# Patient Record
Sex: Female | Born: 1990 | Race: Black or African American | Hispanic: No | Marital: Single | Smoking: Former smoker
Health system: Southern US, Community
[De-identification: ages and names within clinical notes are randomized; demographics above are authoritative.]

## PROBLEM LIST (undated history)

## (undated) ENCOUNTER — Inpatient Hospital Stay (HOSPITAL_COMMUNITY): Payer: Self-pay

## (undated) DIAGNOSIS — N83209 Unspecified ovarian cyst, unspecified side: Secondary | ICD-10-CM

## (undated) DIAGNOSIS — D649 Anemia, unspecified: Secondary | ICD-10-CM

## (undated) DIAGNOSIS — G43909 Migraine, unspecified, not intractable, without status migrainosus: Secondary | ICD-10-CM

## (undated) HISTORY — PX: OOPHORECTOMY: SHX86

---
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
## 2012-10-02 ENCOUNTER — Encounter (HOSPITAL_COMMUNITY): Payer: Self-pay

## 2012-10-02 ENCOUNTER — Emergency Department (HOSPITAL_COMMUNITY)
Admission: EM | Admit: 2012-10-02 | Discharge: 2012-10-03 | Disposition: A | Discharge status: Home / Self Care | Payer: Self-pay | Attending: Emergency Medicine | Admitting: Emergency Medicine

## 2012-10-02 DIAGNOSIS — Z79899 Other long term (current) drug therapy: Secondary | ICD-10-CM | POA: Insufficient documentation

## 2012-10-02 DIAGNOSIS — Z8679 Personal history of other diseases of the circulatory system: Secondary | ICD-10-CM | POA: Insufficient documentation

## 2012-10-02 DIAGNOSIS — R110 Nausea: Secondary | ICD-10-CM | POA: Insufficient documentation

## 2012-10-02 DIAGNOSIS — R51 Headache: Secondary | ICD-10-CM | POA: Insufficient documentation

## 2012-10-02 HISTORY — DX: Migraine, unspecified, not intractable, without status migrainosus: G43.909

## 2012-10-02 MED ORDER — DEXAMETHASONE SODIUM PHOSPHATE 10 MG/ML IJ SOLN
10.0000 mg | Freq: Once | INTRAMUSCULAR | Status: AC
  Administered 2012-10-02: 10 mg via INTRAVENOUS
  Filled 2012-10-02: qty 1

## 2012-10-02 MED ORDER — METOCLOPRAMIDE HCL 5 MG/ML IJ SOLN
10.0000 mg | Freq: Once | INTRAMUSCULAR | Status: AC
  Administered 2012-10-02: 10 mg via INTRAVENOUS
  Filled 2012-10-02: qty 2

## 2012-10-02 MED ORDER — DIPHENHYDRAMINE HCL 50 MG/ML IJ SOLN
25.0000 mg | Freq: Once | INTRAMUSCULAR | Status: AC
  Administered 2012-10-02: 25 mg via INTRAVENOUS
  Filled 2012-10-02: qty 1

## 2012-10-02 MED ORDER — SODIUM CHLORIDE 0.9 % IV BOLUS (SEPSIS)
1000.0000 mL | Freq: Once | INTRAVENOUS | Status: AC
  Administered 2012-10-02: 1000 mL via INTRAVENOUS

## 2012-10-02 MED ORDER — KETOROLAC TROMETHAMINE 30 MG/ML IJ SOLN
30.0000 mg | Freq: Once | INTRAMUSCULAR | Status: AC
  Administered 2012-10-02: 30 mg via INTRAVENOUS
  Filled 2012-10-02: qty 1

## 2012-10-02 NOTE — ED Provider Notes (Signed)
History     CSN: [id number]  Arrival date & time 10/02/12  2147   First MD Initiated Contact with Patient 10/02/12 2246      Chief Complaint  Patient presents with  . Headache   HPI   History provided by the patient. The patient is a 22 year old female who has been diagnosed with migraine headaches and presents with complaints of similar migraine headache symptoms. She states her headache first began yesterday morning and has a mild "normal headache". It was affecting her bilateral forehead. She took an Excedrin in the afternoon and laid down for a 2 hour nap and seemed to feel better. In the evening her symptoms returned and she did take one of her Imitrex. After sleeping through the night and waking this morning she was feeling well he in her headache symptoms started to return gradually through the day. She also began to have some nausea which she took some Zofran 4 with mild improvement. As her headache worsened into her migraine pain she began having pains through her back muscles. Pains are worse with movements and occurred during one of her most recent severe migraines. She has not used any other medications for symptoms. Denies any other aggravating or alleviating factors. No episodes of vomiting. Denies fever, chills or sweats.     Past Medical History  Diagnosis Date  . Migraine     Past Surgical History  Procedure Laterality Date  . Oophorectomy Right     No family history on file.  History  Substance Use Topics  . Smoking status: Not on file  . Smokeless tobacco: Never Used  . Alcohol Use: No    OB History   Grav Para Term Preterm Abortions TAB SAB Ect Mult Living                  Review of Systems  Constitutional: Negative for fever, chills and diaphoresis.  Gastrointestinal: Positive for nausea. Negative for vomiting.  Musculoskeletal: Positive for back pain.  Neurological: Positive for headaches.  All other systems reviewed and are  negative.    Allergies  Review of patient's allergies indicates no known allergies.  Home Medications   Current Outpatient Rx  Name  Route  Sig  Dispense  Refill  . aspirin-acetaminophen-caffeine (EXCEDRIN MIGRAINE) 250-250-65 MG per tablet   Oral   Take 2 tablets by mouth every 6 (six) hours as needed for pain (migraine).         . cetirizine (ZYRTEC) 10 MG tablet   Oral   Take 10 mg by mouth daily as needed.           . ondansetron (ZOFRAN-ODT) 8 MG disintegrating tablet   Oral   Take 8 mg by mouth every 8 (eight) hours as needed for nausea (nausea).         . SUMAtriptan (IMITREX) 50 MG tablet   Oral   Take 50 mg by mouth every 2 (two) hours as needed for migraine.           BP 113/64  Pulse 114  Temp(Src) 100.4 F (38 C) (Oral)  Resp 18  Ht 5\' 4"  (1.626 m)  Wt 276 lb 2 oz (125.249 kg)  BMI 47.37 kg/m2  SpO2 100%  LMP 09/19/2012  Physical Exam  Nursing note and vitals reviewed. Constitutional: She is oriented to person, place, and time. She appears well-developed and well-nourished. No distress.  HENT:  Head: Normocephalic and atraumatic.  Eyes: Conjunctivae and EOM are normal. Pupils are  equal, round, and reactive to light.  Neck: Normal range of motion. Neck supple.  No meningeal signs  Cardiovascular: Normal rate and regular rhythm.   No murmur heard. Pulmonary/Chest: Effort normal and breath sounds normal. No respiratory distress. She has no wheezes. She has no rales.  Abdominal: Soft. There is no tenderness. There is no rigidity, no rebound, no guarding, no CVA tenderness and no tenderness at McBurney's point.  Musculoskeletal: Normal range of motion. She exhibits no edema and no tenderness.  Neurological: She is alert and oriented to person, place, and time. She has normal strength. No cranial nerve deficit or sensory deficit. Gait normal.  Skin: Skin is warm and dry. No rash noted.  Psychiatric: She has a normal mood and affect. Her behavior is  normal.    ED Course  Procedures       1. Headache       MDM  10:50 PM patient seen and evaluated. Patient well-appearing in no acute distress. Normal nonfocal neuro exam. Has reported history of migraine headaches being treated by Imitrex and Excedrin by PCP. I personally rechecked her temperature which was 98.3. No complaints of fever.  Patient feeling much better after medications. Headache in her back pains have soft. She has no focal neural deficits at this time is ready to be discharged home.      Angus Seller, PA-C 10/03/12 [phone]

## 2012-10-02 NOTE — ED Notes (Signed)
Pt states she has a hx of migraine headaches-developed generalized headache yesterday AM with pain and pressure radiating down neck to shoulders and arms and states progresses to lower back-states has not had a migraine like this in 1 year-is not on any preventative migraine meds-used sumatriptan at noon and 1500-states "it made me sleep"-no nausea today-took zofran last night for nausea.

## 2012-10-04 NOTE — ED Provider Notes (Signed)
Medical screening examination/treatment/procedure(s) were performed by non-physician practitioner and as supervising physician I was immediately available for consultation/collaboration.  Brennen Gardiner, MD 10/04/12 1618 

## 2013-04-23 ENCOUNTER — Emergency Department (HOSPITAL_COMMUNITY)
Admission: EM | Admit: 2013-04-23 | Discharge: 2013-04-23 | Disposition: A | Discharge status: Home / Self Care | Payer: Medicaid Other | Attending: Emergency Medicine | Admitting: Emergency Medicine

## 2013-04-23 ENCOUNTER — Encounter (HOSPITAL_COMMUNITY): Payer: Self-pay | Admitting: Emergency Medicine

## 2013-04-23 DIAGNOSIS — Z8679 Personal history of other diseases of the circulatory system: Secondary | ICD-10-CM | POA: Insufficient documentation

## 2013-04-23 DIAGNOSIS — J039 Acute tonsillitis, unspecified: Secondary | ICD-10-CM | POA: Insufficient documentation

## 2013-04-23 DIAGNOSIS — F17200 Nicotine dependence, unspecified, uncomplicated: Secondary | ICD-10-CM | POA: Insufficient documentation

## 2013-04-23 DIAGNOSIS — R000 Tachycardia, unspecified: Secondary | ICD-10-CM | POA: Insufficient documentation

## 2013-04-23 LAB — RAPID STREP SCREEN (MED CTR MEBANE ONLY): Streptococcus, Group A Screen (Direct): NEGATIVE

## 2013-04-23 MED ORDER — AMOXICILLIN 500 MG PO CAPS: 500.0000 mg | ORAL_CAPSULE | Freq: Two times a day (BID) | ORAL | Status: DC

## 2013-04-23 MED ORDER — IBUPROFEN 800 MG PO TABS
800.0000 mg | ORAL_TABLET | Freq: Once | ORAL | Status: AC
  Administered 2013-04-23: 800 mg via ORAL
  Filled 2013-04-23: qty 1

## 2013-04-23 MED ORDER — HYDROCODONE-ACETAMINOPHEN 7.5-325 MG/15ML PO SOLN: 15.0000 mL | Freq: Four times a day (QID) | ORAL | Status: DC | PRN

## 2013-04-23 NOTE — ED Notes (Signed)
Sore throat / headache x 2 days

## 2013-04-23 NOTE — ED Notes (Signed)
Pt presents with c/o sore throat, weakness, and a bad headache.

## 2013-04-23 NOTE — ED Provider Notes (Signed)
CSN: [id number]     Arrival date & time 04/23/13  1854 History  This chart was scribed for non-physician practitioner, Antony Madura, PA-C working with Nelia Shi, MD by Greggory Stallion, ED scribe. This patient was seen in room WTR7/WTR7 and the patient's care was started at 9:49 PM.    Chief Complaint  Patient presents with  . Sore Throat   The history is provided by the patient. No language interpreter was used.   HPI Comments: Cynthia Horne is a 22 y.o. female who presents to the Emergency Department complaining of worsening sore throat that started 2 days ago. Swallowing worsens the pain. She has also had fever and headache. Pt has not done anything for her symptoms. Denies inability to swallowing, drooling, nasal congestion, rhinorrhea, ear drainage, neck pain or stiffness, and shortness of breath. Denies history of mono.   Past Medical History  Diagnosis Date  . Migraine    Past Surgical History  Procedure Laterality Date  . Oophorectomy Right    No family history on file. History  Substance Use Topics  . Smoking status: Current Every Day Smoker -- 0.50 packs/day    Types: Cigarettes, Cigars  . Smokeless tobacco: Never Used  . Alcohol Use: No   OB History   Grav Para Term Preterm Abortions TAB SAB Ect Mult Living                 Review of Systems  Constitutional: Positive for fever.  HENT: Positive for sore throat. Negative for congestion, drooling, ear discharge, rhinorrhea and trouble swallowing.   Neurological: Positive for headaches.  All other systems reviewed and are negative.   Allergies  Review of patient's allergies indicates no known allergies.  Home Medications   Current Outpatient Rx  Name  Route  Sig  Dispense  Refill  . amoxicillin (AMOXIL) 500 MG capsule   Oral   Take 1 capsule (500 mg total) by mouth 2 (two) times daily.   20 capsule   0   . HYDROcodone-acetaminophen (HYCET) 7.5-325 mg/15 ml solution   Oral   Take 15 mLs by mouth 4 (four)  times daily as needed for moderate pain.   120 mL   0    BP 110/76  Pulse 131  Temp(Src) 101.6 F (38.7 C) (Oral)  Resp 24  SpO2 100%  LMP 03/26/2013  Physical Exam  Nursing note and vitals reviewed. Constitutional: She is oriented to person, place, and time. She appears well-developed and well-nourished. No distress.  HENT:  Head: Normocephalic and atraumatic.  Right Ear: Tympanic membrane, external ear and ear canal normal.  Left Ear: Tympanic membrane, external ear and ear canal normal.  Nose: Nose normal.  Mouth/Throat: Uvula is midline and mucous membranes are normal. No oral lesions. No trismus in the jaw. Oropharyngeal exudate and posterior oropharyngeal erythema present. No posterior oropharyngeal edema or tonsillar abscesses.  Bilateral tonsillar enlargement and erythema, as well as exudates. Airway patent. Patient tolerating secretions without difficulty.  Eyes: Conjunctivae and EOM are normal. Pupils are equal, round, and reactive to light. No scleral icterus.  Neck: Normal range of motion. Neck supple.  Cardiovascular: Regular rhythm and normal heart sounds.  Tachycardia present.   Pulmonary/Chest: Effort normal and breath sounds normal. No stridor. No respiratory distress. She has no wheezes. She has no rales.  Abdominal: Soft. She exhibits no distension. There is no tenderness.  Musculoskeletal: Normal range of motion.  Lymphadenopathy:    She has cervical adenopathy.  Neurological: She  is alert and oriented to person, place, and time.  Skin: Skin is warm and dry. No rash noted. She is not diaphoretic. No erythema. No pallor.  Psychiatric: She has a normal mood and affect. Her behavior is normal.    ED Course  Procedures (including critical care time)  DIAGNOSTIC STUDIES: Oxygen Saturation is 100% on RA, normal by my interpretation.    COORDINATION OF CARE: 9:55 PM-Discussed treatment plan which includes strep test and tylenol with pt at bedside and pt agreed  to plan.   Labs Review Labs Reviewed  RAPID STREP SCREEN  CULTURE, GROUP A STREP   Imaging Review No results found.  EKG Interpretation   None       MDM   1. Tonsillitis    Uncomplicated tonsillitis. Patient well and nontoxic appearing and hemodynamically stable. Uvula midline without evidence of peritonsillar abscess. No nuchal rigidity or meningeal signs. Airway patent patient tolerating secretions without difficulty. Rapid strep screen negative today. Patient stable for discharge with prescription for Hycet. Given physical exam findings, will also prescribe amoxicillin to begin on 04/26/2013 should symptoms not improve with supportive treatment. Saltwater gargles and ibuprofen also advised. Return precautions provided and patient agreeable to plan with no unaddressed concerns.  I personally performed the services described in this documentation, which was scribed in my presence. The recorded information has been reviewed and is accurate.   Filed Vitals:   04/23/13 2007 04/23/13 2222  BP: 110/76   Pulse: 131   Temp: 101.6 F (38.7 C) 98.9 F (37.2 C)  TempSrc: Oral Oral  Resp: 24   SpO2: 100%      Antony Madura, PA-C 04/23/13 2237

## 2013-04-25 LAB — CULTURE, GROUP A STREP

## 2013-04-26 ENCOUNTER — Telehealth (HOSPITAL_COMMUNITY): Payer: Self-pay | Admitting: Emergency Medicine

## 2013-04-26 NOTE — ED Provider Notes (Signed)
Medical screening examination/treatment/procedure(s) were performed by non-physician practitioner and as supervising physician I was immediately available for consultation/collaboration.   Ivonne Freeburg L Nicolis Boody, MD 04/26/13 1048 

## 2013-04-26 NOTE — ED Notes (Signed)
Post ED Visit - Positive Culture Follow-up  Culture report reviewed by antimicrobial stewardship pharmacist: []  Wes Dulaney, Pharm.D., BCPS []  Celedonio MiyamotoJeremy Frens, Pharm.D., BCPS []  Georgina PillionElizabeth Martin, [address]Ph[address] BCPS []  FredoniaMinh Pham, [address]Ph[address]D., BCPS, AAHIVP []  Estella HuskMichelle Turner, Pharm.D., BCPS, AAHIVP [x]  Lysle Pearlachel Rumbarger, Pharm.D., BCPS  Positive strep culture Treated with Amoxicillin, organism sensitive to the same and no further patient follow-up is required at this time.  Fancy Farm[place]land, Jenel LucksKylie 04/26/2013, 1:29 PM

## 2013-08-30 ENCOUNTER — Encounter (HOSPITAL_COMMUNITY): Payer: Self-pay | Admitting: Emergency Medicine

## 2013-08-30 ENCOUNTER — Emergency Department (HOSPITAL_COMMUNITY)
Admission: EM | Admit: 2013-08-30 | Discharge: 2013-08-30 | Disposition: A | Discharge status: Home / Self Care | Payer: No Typology Code available for payment source | Attending: Emergency Medicine | Admitting: Emergency Medicine

## 2013-08-30 ENCOUNTER — Emergency Department (HOSPITAL_COMMUNITY): Payer: No Typology Code available for payment source

## 2013-08-30 DIAGNOSIS — S161XXA Strain of muscle, fascia and tendon at neck level, initial encounter: Secondary | ICD-10-CM

## 2013-08-30 DIAGNOSIS — Z791 Long term (current) use of non-steroidal anti-inflammatories (NSAID): Secondary | ICD-10-CM | POA: Insufficient documentation

## 2013-08-30 DIAGNOSIS — S29012A Strain of muscle and tendon of back wall of thorax, initial encounter: Secondary | ICD-10-CM

## 2013-08-30 DIAGNOSIS — Y9389 Activity, other specified: Secondary | ICD-10-CM | POA: Insufficient documentation

## 2013-08-30 DIAGNOSIS — F17200 Nicotine dependence, unspecified, uncomplicated: Secondary | ICD-10-CM | POA: Insufficient documentation

## 2013-08-30 DIAGNOSIS — S139XXA Sprain of joints and ligaments of unspecified parts of neck, initial encounter: Secondary | ICD-10-CM | POA: Insufficient documentation

## 2013-08-30 DIAGNOSIS — Z8679 Personal history of other diseases of the circulatory system: Secondary | ICD-10-CM | POA: Insufficient documentation

## 2013-08-30 DIAGNOSIS — S239XXA Sprain of unspecified parts of thorax, initial encounter: Secondary | ICD-10-CM | POA: Insufficient documentation

## 2013-08-30 DIAGNOSIS — Z79899 Other long term (current) drug therapy: Secondary | ICD-10-CM | POA: Insufficient documentation

## 2013-08-30 DIAGNOSIS — Y92410 Unspecified street and highway as the place of occurrence of the external cause: Secondary | ICD-10-CM | POA: Insufficient documentation

## 2013-08-30 MED ORDER — OXYCODONE-ACETAMINOPHEN 5-325 MG PO TABS
2.0000 | ORAL_TABLET | Freq: Once | ORAL | Status: AC
  Administered 2013-08-30: 1 via ORAL
  Filled 2013-08-30: qty 2

## 2013-08-30 MED ORDER — ONDANSETRON 8 MG PO TBDP
8.0000 mg | ORAL_TABLET | Freq: Once | ORAL | Status: AC
  Administered 2013-08-30: 8 mg via ORAL
  Filled 2013-08-30: qty 1

## 2013-08-30 MED ORDER — MELOXICAM 7.5 MG PO TABS: 15.0000 mg | ORAL_TABLET | Freq: Every day | ORAL | Status: DC

## 2013-08-30 MED ORDER — METHOCARBAMOL 500 MG PO TABS
500.0000 mg | ORAL_TABLET | Freq: Once | ORAL | Status: AC
  Administered 2013-08-30: 500 mg via ORAL
  Filled 2013-08-30: qty 1

## 2013-08-30 MED ORDER — METHOCARBAMOL 500 MG PO TABS: 500.0000 mg | ORAL_TABLET | Freq: Two times a day (BID) | ORAL | Status: DC

## 2013-08-30 MED ORDER — HYDROCODONE-ACETAMINOPHEN 5-325 MG PO TABS: 1.0000 | ORAL_TABLET | Freq: Four times a day (QID) | ORAL | Status: DC | PRN
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
## 2013-08-30 NOTE — ED Notes (Signed)
Patient transported to X-ray 

## 2013-08-30 NOTE — Discharge Instructions (Signed)
Recommend you take Mobic and Robaxin as prescribed and apply ice to the areas of pain 3-4 times per day for at least 30 minutes each time. Do not be surprised if you wake up tomorrow and notice a different area hurting you. The treatment for this pain will be the same as above. You may take Norco as needed for severe pain. Followup with your primary care doctor.  Cervical Sprain A cervical sprain is an injury in the neck in which the strong, fibrous tissues (ligaments) that connect your neck bones stretch or tear. Cervical sprains can range from mild to severe. Severe cervical sprains can cause the neck vertebrae to be unstable. This can lead to damage of the spinal cord and can result in serious nervous system problems. The amount of time it takes for a cervical sprain to get better depends on the cause and extent of the injury. Most cervical sprains heal in 1 to 3 weeks. CAUSES  Severe cervical sprains may be caused by:   Contact sport injuries (such as from football, rugby, wrestling, hockey, auto racing, gymnastics, diving, martial arts, or boxing).   Motor vehicle collisions.   Whiplash injuries. This is an injury from a sudden forward-and backward whipping movement of the head and neck.  Falls.  Mild cervical sprains may be caused by:   Being in an awkward position, such as while cradling a telephone between your ear and shoulder.   Sitting in a chair that does not offer proper support.   Working at a poorly Marketing executivedesigned computer station.   Looking up or down for long periods of time.  SYMPTOMS   Pain, soreness, stiffness, or a burning sensation in the front, back, or sides of the neck. This discomfort may develop immediately after the injury or slowly, 24 hours or more after the injury.   Pain or tenderness directly in the middle of the back of the neck.   Shoulder or upper back pain.   Limited ability to move the neck.   Headache.   Dizziness.   Weakness,  numbness, or tingling in the hands or arms.   Muscle spasms.   Difficulty swallowing or chewing.   Tenderness and swelling of the neck.  DIAGNOSIS  Most of the time your health care provider can diagnose a cervical sprain by taking your history and doing a physical exam. Your health care provider will ask about previous neck injuries and any known neck problems, such as arthritis in the neck. X-rays may be taken to find out if there are any other problems, such as with the bones of the neck. Other tests, such as a CT scan or MRI, may also be needed.  TREATMENT  Treatment depends on the severity of the cervical sprain. Mild sprains can be treated with rest, keeping the neck in place (immobilization), and pain medicines. Severe cervical sprains are immediately immobilized. Further treatment is done to help with pain, muscle spasms, and other symptoms and may include:  Medicines, such as pain relievers, numbing medicines, or muscle relaxants.   Physical therapy. This may involve stretching exercises, strengthening exercises, and posture training. Exercises and improved posture can help stabilize the neck, strengthen muscles, and help stop symptoms from returning.  HOME CARE INSTRUCTIONS   Put ice on the injured area.   Put ice in a plastic bag.   Place a towel between your skin and the bag.   Leave the ice on for 15 20 minutes, 3 4 times a day.  If your injury was severe, you may have been given a cervical collar to wear. A cervical collar is a two-piece collar designed to keep your neck from moving while it heals.  Do not remove the collar unless instructed by your health care provider.  If you have long hair, keep it outside of the collar.  Ask your health care provider before making any adjustments to your collar. Minor adjustments may be required over time to improve comfort and reduce pressure on your chin or on the back of your head.  Ifyou are allowed to remove the  collar for cleaning or bathing, follow your health care provider's instructions on how to do so safely.  Keep your collar clean by wiping it with mild soap and water and drying it completely. If the collar you have been given includes removable pads, remove them every 1 2 days and hand wash them with soap and water. Allow them to air dry. They should be completely dry before you wear them in the collar.  If you are allowed to remove the collar for cleaning and bathing, wash and dry the skin of your neck. Check your skin for irritation or sores. If you see any, tell your health care provider.  Do not drive while wearing the collar.   Only take over-the-counter or prescription medicines for pain, discomfort, or fever as directed by your health care provider.   Keep all follow-up appointments as directed by your health care provider.   Keep all physical therapy appointments as directed by your health care provider.   Make any needed adjustments to your workstation to promote good posture.   Avoid positions and activities that make your symptoms worse.   Warm up and stretch before being active to help prevent problems.  SEEK MEDICAL CARE IF:   Your pain is not controlled with medicine.   You are unable to decrease your pain medicine over time as planned.   Your activity level is not improving as expected.  SEEK IMMEDIATE MEDICAL CARE IF:   You develop any bleeding.  You develop stomach upset.  You have signs of an allergic reaction to your medicine.   Your symptoms get worse.   You develop new, unexplained symptoms.   You have numbness, tingling, weakness, or paralysis in any part of your body.  MAKE SURE YOU:   Understand these instructions.  Will watch your condition.  Will get help right away if you are not doing well or get worse. Document Released: 02/07/2007 Document Revised: 01/31/2013 Document Reviewed: 10/18/2012 [facility] Patient Information 2014  [place], [place].  Back Pain, Adult Low back pain is very common. About 1 in 5 people have back pain.The cause of low back pain is rarely dangerous. The pain often gets better over time.About half of people with a sudden onset of back pain feel better in just 2 weeks. About 8 in 10 people feel better by 6 weeks.  CAUSES Some common causes of back pain include:  Strain of the muscles or ligaments supporting the spine.  Wear and tear (degeneration) of the spinal discs.  Arthritis.  Direct injury to the back. DIAGNOSIS Most of the time, the direct cause of low back pain is not known.However, back pain can be treated effectively even when the exact cause of the pain is unknown.Answering your caregiver's questions about your overall health and symptoms is one of the most accurate ways to make sure the cause of your pain is not dangerous. If your caregiver  needs more information, he or she may order lab work or imaging tests (X-rays or MRIs).However, even if imaging tests show changes in your back, this usually does not require surgery. HOME CARE INSTRUCTIONS For many people, back pain returns.Since low back pain is rarely dangerous, it is often a condition that people can learn to [facility] their own.   Remain active. It is stressful on the back to sit or stand in one place. Do not sit, drive, or stand in one place for more than 30 minutes at a time. Take short walks on level surfaces as soon as pain allows.Try to increase the length of time you walk each day.  Do not stay in bed.Resting more than 1 or 2 days can delay your recovery.  Do not avoid exercise or work.Your body is made to move.It is not dangerous to be active, even though your back may hurt.Your back will likely heal faster if you return to being active before your pain is gone.  Pay attention to your body when you bend and lift. Many people have less discomfortwhen lifting if they bend their knees, keep the load close to  their bodies,and avoid twisting. Often, the most comfortable positions are those that put less stress on your recovering back.  Find a comfortable position to sleep. Use a firm mattress and lie on your side with your knees slightly bent. If you lie on your back, put a pillow under your knees.  Only take over-the-counter or prescription medicines as directed by your caregiver. Over-the-counter medicines to reduce pain and inflammation are often the most helpful.Your caregiver may prescribe muscle relaxant drugs.These medicines help dull your pain so you can more quickly return to your normal activities and healthy exercise.  Put ice on the injured area.  Put ice in a plastic bag.  Place a towel between your skin and the bag.  Leave the ice on for 15-20 minutes, 03-04 times a day for the first 2 to 3 days. After that, ice and heat may be alternated to reduce pain and spasms.  Ask your caregiver about trying back exercises and gentle massage. This may be of some benefit.  Avoid feeling anxious or stressed.Stress increases muscle tension and can worsen back pain.It is important to recognize when you are anxious or stressed and learn ways to manage it.Exercise is a great option. SEEK MEDICAL CARE IF:  You have pain that is not relieved with rest or medicine.  You have pain that does not improve in 1 week.  You have new symptoms.  You are generally not feeling well. SEEK IMMEDIATE MEDICAL CARE IF:   You have pain that radiates from your back into your legs.  You develop new bowel or bladder control problems.  You have unusual weakness or numbness in your arms or legs.  You develop nausea or vomiting.  You develop abdominal pain.  You feel faint. Document Released: 04/12/2005 Document Revised: 10/12/2011 Document Reviewed: 08/31/2010 [facility] Patient Information 2014 [place], [place].
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
## 2013-09-14 NOTE — ED Provider Notes (Signed)
Medical screening examination/treatment/procedure(s) were performed by non-physician practitioner and as supervising physician I was immediately available for consultation/collaboration.   EKG Interpretation None        Enid Skeens, MD 09/14/13 [phone]

## 2014-02-08 ENCOUNTER — Emergency Department (HOSPITAL_COMMUNITY)
Admission: EM | Admit: 2014-02-08 | Discharge: 2014-02-08 | Disposition: A | Discharge status: Home / Self Care | Payer: No Typology Code available for payment source | Attending: Emergency Medicine | Admitting: Emergency Medicine

## 2014-02-08 ENCOUNTER — Encounter (HOSPITAL_COMMUNITY): Payer: Self-pay | Admitting: Emergency Medicine

## 2014-02-08 DIAGNOSIS — Z720 Tobacco use: Secondary | ICD-10-CM | POA: Insufficient documentation

## 2014-02-08 DIAGNOSIS — Z3202 Encounter for pregnancy test, result negative: Secondary | ICD-10-CM | POA: Insufficient documentation

## 2014-02-08 DIAGNOSIS — Z79899 Other long term (current) drug therapy: Secondary | ICD-10-CM | POA: Insufficient documentation

## 2014-02-08 DIAGNOSIS — J0100 Acute maxillary sinusitis, unspecified: Secondary | ICD-10-CM | POA: Insufficient documentation

## 2014-02-08 DIAGNOSIS — Z8679 Personal history of other diseases of the circulatory system: Secondary | ICD-10-CM | POA: Insufficient documentation

## 2014-02-08 DIAGNOSIS — Z791 Long term (current) use of non-steroidal anti-inflammatories (NSAID): Secondary | ICD-10-CM | POA: Insufficient documentation

## 2014-02-08 LAB — RAPID STREP SCREEN (MED CTR MEBANE ONLY): STREPTOCOCCUS, GROUP A SCREEN (DIRECT): NEGATIVE

## 2014-02-08 LAB — POC URINE PREG, ED: PREG TEST UR: NEGATIVE

## 2014-02-08 MED ORDER — CEPASTAT 14.5 MG MT LOZG: 1.0000 | LOZENGE | OROMUCOSAL | Status: DC | PRN

## 2014-02-08 MED ORDER — AMOXICILLIN 500 MG PO CAPS: 500.0000 mg | ORAL_CAPSULE | Freq: Three times a day (TID) | ORAL | Status: DC

## 2014-02-08 NOTE — ED Notes (Signed)
PA-C at bedside 

## 2014-02-08 NOTE — ED Notes (Signed)
Pt arrived to ED with a complaint of a sore throat.  Pt states she feels her tonsils are swollen .  Pt states condition has been present for a month.  Tonight pt has a cough wwhich has irritated her throat and causes her pain.

## 2014-02-08 NOTE — ED Provider Notes (Signed)
CSN: [id number]     Arrival date & time 02/08/14  0046 History   First MD Initiated Contact with Patient 02/08/14 0132     Chief Complaint  Patient presents with  . Sore Throat  . Cough     (Consider location/radiation/quality/duration/timing/severity/associated sxs/prior Treatment) HPI Comments: Patient is a 23 year old female who presents with a 1 month history of sore throat. Patient reports gradual onset and progressively worsening sharp, severe throat pain. The pain is constant and made worse with swallowing. The pain is localized to the patient's throat and equal on both sides. Nothing alleviates the pain. The patient has not tried anything for symptom relief. Patient reports associated subjective fever, cervical adenopathy, sinus congestion and non productive cough. Patient denies headache, visual changes, difficulty breathing, chest pain, SOB, abdominal pain, NVD.      Past Medical History  Diagnosis Date  . Migraine    Past Surgical History  Procedure Laterality Date  . Oophorectomy Right    History reviewed. No pertinent family history. History  Substance Use Topics  . Smoking status: Current Every Day Smoker -- 0.50 packs/day    Types: Cigarettes, Cigars  . Smokeless tobacco: Never Used  . Alcohol Use: No   OB History   Grav Para Term Preterm Abortions TAB SAB Ect Mult Living                 Review of Systems  Constitutional: Negative for fever, chills and fatigue.  HENT: Positive for congestion and sore throat. Negative for trouble swallowing.   Eyes: Negative for visual disturbance.  Respiratory: Negative for shortness of breath.   Cardiovascular: Negative for chest pain and palpitations.  Gastrointestinal: Negative for nausea, vomiting, abdominal pain and diarrhea.  Genitourinary: Negative for dysuria and difficulty urinating.  Musculoskeletal: Negative for arthralgias and neck pain.  Skin: Negative for color change.  Neurological: Negative for dizziness  and weakness.  Psychiatric/Behavioral: Negative for dysphoric mood.      Allergies  Review of patient's allergies indicates no known allergies.  Home Medications   Prior to Admission medications   Medication Sig Start Date End Date Taking? Authorizing Provider  cetirizine (ZYRTEC) 10 MG tablet Take 10 mg by mouth daily.    Historical Provider, MD  HYDROcodone-acetaminophen (NORCO/VICODIN) 5-325 MG per tablet Take 1-2 tablets by mouth every 6 (six) hours as needed. 08/30/13   Antony MaduraKelly Humes, PA-C  ibuprofen (ADVIL,MOTRIN) 200 MG tablet Take 400 mg by mouth every 6 (six) hours as needed.    Historical Provider, MD  meloxicam (MOBIC) 7.5 MG tablet Take 2 tablets (15 mg total) by mouth daily. 08/30/13   Antony MaduraKelly Humes, PA-C  methocarbamol (ROBAXIN) 500 MG tablet Take 1 tablet (500 mg total) by mouth 2 (two) times daily. 08/30/13   Antony MaduraKelly Humes, PA-C   BP 117/74  Pulse 94  Temp(Src) 98.3 F (36.8 C) (Oral)  Resp 18  Ht 5\' 5"  (1.651 m)  SpO2 96%  LMP 12/25/2013 Physical Exam  Nursing note and vitals reviewed. Constitutional: She is oriented to person, place, and time. She appears well-developed and well-nourished. No distress.  HENT:  Head: Normocephalic and atraumatic.  Mouth/Throat: Oropharynx is clear and moist. No oropharyngeal exudate.  Bilateral tonsillar edema without erythema or exudate. Maxillary sinus tenderness to palpation.   Eyes: Conjunctivae and EOM are normal.  Neck: Normal range of motion.  Cardiovascular: Normal rate and regular rhythm.  Exam reveals no gallop and no friction rub.   No murmur heard. Pulmonary/Chest: Effort normal  and breath sounds normal. She has no wheezes. She has no rales. She exhibits no tenderness.  Abdominal: Soft. She exhibits no distension. There is no tenderness. There is no rebound.  Musculoskeletal: Normal range of motion.  Lymphadenopathy:    She has cervical adenopathy.  Neurological: She is alert and oriented to person, place, and time.  Coordination normal.  Speech is goal-oriented. Moves limbs without ataxia.   Skin: Skin is warm and dry.  Psychiatric: She has a normal mood and affect. Her behavior is normal.    ED Course  Procedures (including critical care time) Labs Review Labs Reviewed  RAPID STREP SCREEN  CULTURE, GROUP A STREP  POC URINE PREG, ED    Imaging Review No results found.   EKG Interpretation None      MDM   Final diagnoses:  Acute maxillary sinusitis, recurrence not specified    1:39 AM Rapid strep pending.   2:29 AM Patient's rapid strep is negative. Urine preg negative. Patient will be treated for sinusitis with amoxicillin. Patient will also have sore throat lozenges for symptoms. Vitals stable and patient afebrile. No further evaluation needed at this time.   Emilia BeckKaitlyn Kimaria Struthers, PA-C 02/08/14 0236

## 2014-02-08 NOTE — ED Notes (Signed)
Bed: WA04 Expected date:  Expected time:  Means of arrival:  Comments: 

## 2014-02-08 NOTE — Discharge Instructions (Signed)
Take amoxicillin as directed until gone. Use sore throat lozenges for throat pain. Refer to attached documents for more information.

## 2014-02-09 NOTE — ED Provider Notes (Signed)
Medical screening examination/treatment/procedure(s) were performed by non-physician practitioner and as supervising physician I was immediately available for consultation/collaboration.   Linwood DibblesJon Xylon Croom, MD 02/09/14 [phone]

## 2014-02-10 LAB — CULTURE, GROUP A STREP

## 2014-06-04 ENCOUNTER — Encounter (HOSPITAL_COMMUNITY): Payer: Self-pay | Admitting: Emergency Medicine

## 2014-06-04 ENCOUNTER — Emergency Department (HOSPITAL_COMMUNITY)
Admission: EM | Admit: 2014-06-04 | Discharge: 2014-06-04 | Disposition: A | Discharge status: Home / Self Care | Payer: No Typology Code available for payment source | Attending: Emergency Medicine | Admitting: Emergency Medicine

## 2014-06-04 DIAGNOSIS — R519 Headache, unspecified: Secondary | ICD-10-CM

## 2014-06-04 DIAGNOSIS — R51 Headache: Secondary | ICD-10-CM | POA: Insufficient documentation

## 2014-06-04 DIAGNOSIS — Z8679 Personal history of other diseases of the circulatory system: Secondary | ICD-10-CM | POA: Insufficient documentation

## 2014-06-04 DIAGNOSIS — Z720 Tobacco use: Secondary | ICD-10-CM | POA: Insufficient documentation

## 2014-06-04 DIAGNOSIS — J029 Acute pharyngitis, unspecified: Secondary | ICD-10-CM

## 2014-06-04 LAB — RAPID STREP SCREEN (MED CTR MEBANE ONLY): Streptococcus, Group A Screen (Direct): NEGATIVE

## 2014-06-04 MED ORDER — METOCLOPRAMIDE HCL 5 MG PO TABS: 5.0000 mg | ORAL_TABLET | Freq: Four times a day (QID) | ORAL | Status: DC | PRN

## 2014-06-04 MED ORDER — KETOROLAC TROMETHAMINE 30 MG/ML IJ SOLN
30.0000 mg | Freq: Once | INTRAMUSCULAR | Status: AC
  Administered 2014-06-04: 30 mg via INTRAMUSCULAR
  Filled 2014-06-04: qty 1

## 2014-06-04 MED ORDER — DIPHENHYDRAMINE HCL 12.5 MG/5ML PO ELIX
12.5000 mg | ORAL_SOLUTION | Freq: Once | ORAL | Status: AC
  Administered 2014-06-04: 12.5 mg via ORAL
  Filled 2014-06-04: qty 5

## 2014-06-04 MED ORDER — METOCLOPRAMIDE HCL 10 MG PO TABS
5.0000 mg | ORAL_TABLET | Freq: Once | ORAL | Status: AC
  Administered 2014-06-04: 5 mg via ORAL
  Filled 2014-06-04: qty 1

## 2014-06-04 NOTE — ED Provider Notes (Addendum)
CSN: [id number]     Arrival date & time 06/04/14  0216 History   First MD Initiated Contact with Patient 06/04/14 0256     Chief Complaint  Patient presents with  . Sore Throat     (Consider location/radiation/quality/duration/timing/severity/associated sxs/prior Treatment) HPI Comments: Patient states that she's had a sore throat with postnasal drainage for the past week.  She is speaking in full sentences.  Not having any difficulty swallowing, has not taken any medication for her symptoms.  Manus Rudd reports that she's had intermittent headaches the past 3 weeks.  She does have a history of migraine headaches but states that in the past when she's had migraine headache whatever medication she has taken has not worked for her, so she has again not try for her headaches.  She denies any visual disturbance, trauma , fever   Patient is a 24 y.o. female presenting with pharyngitis. The history is provided by the patient.  Sore Throat This is a new problem. The current episode started in the past 7 days. The problem occurs constantly. The problem has been unchanged. Pertinent negatives include no fever, nausea or weakness. The symptoms are aggravated by swallowing. She has tried nothing for the symptoms. The treatment provided no relief.    Past Medical History  Diagnosis Date  . Migraine    Past Surgical History  Procedure Laterality Date  . Oophorectomy Right    History reviewed. No pertinent family history. History  Substance Use Topics  . Smoking status: Current Some Day Smoker -- 0.50 packs/day    Types: Cigarettes, Cigars  . Smokeless tobacco: Never Used  . Alcohol Use: No   OB History    No data available     Review of Systems  Constitutional: Negative for fever.  Gastrointestinal: Negative for nausea.  Neurological: Negative for weakness.      Allergies  Review of patient's allergies indicates no known allergies.  Home Medications   Prior to Admission medications    Medication Sig Start Date End Date Taking? Authorizing Provider  amoxicillin (AMOXIL) 500 MG capsule Take 1 capsule (500 mg total) by mouth 3 (three) times daily. Patient not taking: Reported on 06/04/2014 02/08/14   Emilia Beck, PA-C  metoCLOPramide (REGLAN) 5 MG tablet Take 1 tablet (5 mg total) by mouth every 6 (six) hours as needed for nausea. 06/04/14   Arman Filter, NP  phenol-menthol (CEPASTAT) 14.5 MG lozenge Place 1 lozenge inside cheek as needed for sore throat. Patient not taking: Reported on 06/04/2014 02/08/14   Kaitlyn Szekalski, PA-C   BP 120/49 mmHg  Pulse 75  Temp(Src) 97.6 F (36.4 C) (Oral)  Resp 18  Ht  (1.626 m)  Wt 270 lb (122.471 kg)  BMI 46.32 kg/m2  SpO2 100%  LMP 05/18/2014 (Exact Date) Physical Exam  Constitutional: She is oriented to person, place, and time. She appears well-developed and well-nourished.  HENT:  Head: Normocephalic.  Mouth/Throat: Oropharynx is clear and moist. No oropharyngeal exudate.  Eyes: Pupils are equal, round, and reactive to light.  Neck: Normal range of motion.  Musculoskeletal: Normal range of motion.  Neurological: She is alert and oriented to person, place, and time.  Skin: Skin is warm and dry.    ED Course  Procedures (including critical care time) Labs Review Labs Reviewed  RAPID STREP SCREEN  CULTURE, GROUP A STREP  PREGNANCY, URINE  CBG MONITORING, ED    Imaging Review No results found.   EKG Interpretation None  Patient requested pregnancy test negative Strep test negative Time of discharge, patient's headache has improved significantly MDM   Final diagnoses:  Nonintractable headache, unspecified chronicity pattern, unspecified headache type  Pharyngitis         Arman FilterGail K Aldrich Lloyd, NP 06/04/14 [id number]  Raeford RazorStephen Kohut, MD 06/06/14 1009  Earley FavorGail Lakea Mittelman, NP 08/11/14 2033  Raeford RazorStephen Kohut, MD 08/12/14 1407

## 2014-06-04 NOTE — Discharge Instructions (Signed)
Your strep test is negative Your pregnancy test is negative

## 2014-06-04 NOTE — ED Notes (Signed)
Pt. CBG 96. Notified RN,Kellee.

## 2014-06-04 NOTE — ED Notes (Addendum)
Pt reports sore throat with drainage "down my throat" for the past week. Pt able to drink without difficulty. 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

## 2014-06-04 NOTE — ED Notes (Signed)
POC device down. POC urine preg Negative. EDNP Gail notified.

## 2014-06-04 NOTE — ED Notes (Signed)
Patient c/o nausea x30 minutes and "dehydration". Patient tolerating PO fluids at this time. No vomiting.

## 2014-06-04 NOTE — ED Notes (Signed)
Patient requesting pregnancy test. NP made aware of same. Verbal orders for urine preg.

## 2014-06-05 LAB — CBG MONITORING, ED: Glucose-Capillary: 99 mg/dL (ref 70–99)

## 2014-06-06 LAB — CULTURE, GROUP A STREP

## 2014-06-18 ENCOUNTER — Encounter (HOSPITAL_COMMUNITY): Payer: Self-pay

## 2014-06-18 ENCOUNTER — Emergency Department (HOSPITAL_COMMUNITY)
Admission: EM | Admit: 2014-06-18 | Discharge: 2014-06-18 | Disposition: A | Discharge status: Home / Self Care | Payer: Medicaid Other | Attending: Emergency Medicine | Admitting: Emergency Medicine

## 2014-06-18 DIAGNOSIS — Z720 Tobacco use: Secondary | ICD-10-CM | POA: Insufficient documentation

## 2014-06-18 DIAGNOSIS — Y998 Other external cause status: Secondary | ICD-10-CM | POA: Insufficient documentation

## 2014-06-18 DIAGNOSIS — S199XXA Unspecified injury of neck, initial encounter: Secondary | ICD-10-CM | POA: Insufficient documentation

## 2014-06-18 DIAGNOSIS — S0993XA Unspecified injury of face, initial encounter: Secondary | ICD-10-CM | POA: Insufficient documentation

## 2014-06-18 DIAGNOSIS — Y9389 Activity, other specified: Secondary | ICD-10-CM | POA: Insufficient documentation

## 2014-06-18 DIAGNOSIS — Z8659 Personal history of other mental and behavioral disorders: Secondary | ICD-10-CM | POA: Insufficient documentation

## 2014-06-18 DIAGNOSIS — Y9289 Other specified places as the place of occurrence of the external cause: Secondary | ICD-10-CM | POA: Insufficient documentation

## 2014-06-18 DIAGNOSIS — R6884 Jaw pain: Secondary | ICD-10-CM

## 2014-06-18 MED ORDER — IBUPROFEN 800 MG PO TABS: 800.0000 mg | ORAL_TABLET | Freq: Three times a day (TID) | ORAL | Status: DC
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
## 2014-06-18 NOTE — Discharge Instructions (Signed)
You were evaluated in the ED today for your jaw neck discomfort. There does not appear any emergent cause for your discomfort at this time. Please take your Motrin as prescribed for any discomfort he may experience. Please follow-up with your primary care within 48 hours for further evaluation and management of your symptoms. Return to ED for new or worsening symptoms.

## 2014-11-17 ENCOUNTER — Emergency Department (HOSPITAL_COMMUNITY)
Admission: EM | Admit: 2014-11-17 | Discharge: 2014-11-17 | Disposition: A | Discharge status: Home / Self Care | Payer: Medicaid Other | Attending: Emergency Medicine | Admitting: Emergency Medicine

## 2014-11-17 ENCOUNTER — Emergency Department (HOSPITAL_COMMUNITY): Payer: Medicaid Other

## 2014-11-17 ENCOUNTER — Encounter (HOSPITAL_COMMUNITY): Payer: Self-pay | Admitting: Nurse Practitioner

## 2014-11-17 ENCOUNTER — Emergency Department (HOSPITAL_COMMUNITY): Admission: EM | Admit: 2014-11-17 | Discharge: 2014-11-17 | Discharge status: Absent without leave | Payer: Self-pay

## 2014-11-17 DIAGNOSIS — Y998 Other external cause status: Secondary | ICD-10-CM | POA: Insufficient documentation

## 2014-11-17 DIAGNOSIS — Y92481 Parking lot as the place of occurrence of the external cause: Secondary | ICD-10-CM | POA: Insufficient documentation

## 2014-11-17 DIAGNOSIS — Z720 Tobacco use: Secondary | ICD-10-CM | POA: Insufficient documentation

## 2014-11-17 DIAGNOSIS — S93401A Sprain of unspecified ligament of right ankle, initial encounter: Secondary | ICD-10-CM | POA: Insufficient documentation

## 2014-11-17 DIAGNOSIS — Y9389 Activity, other specified: Secondary | ICD-10-CM | POA: Insufficient documentation

## 2014-11-17 DIAGNOSIS — G4489 Other headache syndrome: Secondary | ICD-10-CM | POA: Insufficient documentation

## 2014-11-17 MED ORDER — PROCHLORPERAZINE EDISYLATE 5 MG/ML IJ SOLN
10.0000 mg | Freq: Four times a day (QID) | INTRAMUSCULAR | Status: DC | PRN
  Administered 2014-11-17: 10 mg via INTRAVENOUS
  Filled 2014-11-17: qty 2

## 2014-11-17 MED ORDER — DIPHENHYDRAMINE HCL 25 MG PO CAPS
25.0000 mg | ORAL_CAPSULE | Freq: Once | ORAL | Status: AC
  Administered 2014-11-17: 25 mg via ORAL
  Filled 2014-11-17: qty 1

## 2014-11-17 MED ORDER — SODIUM CHLORIDE 0.9 % IV BOLUS (SEPSIS)
1000.0000 mL | Freq: Once | INTRAVENOUS | Status: AC
  Administered 2014-11-17: 1000 mL via INTRAVENOUS

## 2014-11-17 MED ORDER — DIPHENHYDRAMINE HCL 50 MG/ML IJ SOLN
25.0000 mg | Freq: Once | INTRAMUSCULAR | Status: AC
  Administered 2014-11-17: 25 mg via INTRAVENOUS
  Filled 2014-11-17: qty 1
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
## 2014-11-17 NOTE — Discharge Instructions (Signed)

## 2014-11-17 NOTE — ED Notes (Signed)
Pt is aware urine is needed for testing. Pt states, "I don't have to pee at all because I have not had anything to drink since I have ben here".

## 2015-04-27 NOTE — L&D Delivery Note (Signed)
Delivery Note At 2:28 AM a viable female was delivered via Vaginal, Spontaneous Delivery (Presentation:OA ;restituted to LOA ).  APGAR: 8, 9; weight pending .   Placenta status:intact , delivered via Tomasa BlaseSchultz.  Cord: three vessels  with the following complications: none.  After 2 minutes, the cord was clamped and cut. 40 units of pitocin diluted in 1000cc LR was infused rapidly IV.  The placenta separated spontaneously and delivered via CCT and maternal pushing effort.  It was inspected and appears to be intact with a 3 VC.   Anesthesia:  epidural Episiotomy: none  Lacerations:  1st degree with repair Suture Repair: 2.0 vicryl Est. Blood Loss (mL):  100  Girl Breast Pt requesting Depo  Mom to postpartum.  Baby to Couplet care / Skin to Skin.  Clayton BiblesSamantha Weinhold, SNM 04/02/2016, 2:55 AM  I was present for the above and agree.

## 2015-08-21 ENCOUNTER — Encounter (HOSPITAL_COMMUNITY): Payer: Self-pay | Admitting: *Deleted

## 2015-08-21 ENCOUNTER — Inpatient Hospital Stay (HOSPITAL_COMMUNITY)
Admission: AD | Admit: 2015-08-21 | Discharge: 2015-08-21 | Disposition: A | Discharge status: Home / Self Care | Payer: Medicaid Other | Source: Ambulatory Visit | Attending: Obstetrics & Gynecology | Admitting: Obstetrics & Gynecology

## 2015-08-21 ENCOUNTER — Inpatient Hospital Stay (HOSPITAL_COMMUNITY): Payer: Medicaid Other

## 2015-08-21 DIAGNOSIS — O208 Other hemorrhage in early pregnancy: Secondary | ICD-10-CM | POA: Diagnosis not present

## 2015-08-21 DIAGNOSIS — O26899 Other specified pregnancy related conditions, unspecified trimester: Secondary | ICD-10-CM

## 2015-08-21 DIAGNOSIS — O99331 Smoking (tobacco) complicating pregnancy, first trimester: Secondary | ICD-10-CM | POA: Insufficient documentation

## 2015-08-21 DIAGNOSIS — O26891 Other specified pregnancy related conditions, first trimester: Secondary | ICD-10-CM | POA: Diagnosis not present

## 2015-08-21 DIAGNOSIS — Z3A01 Less than 8 weeks gestation of pregnancy: Secondary | ICD-10-CM | POA: Insufficient documentation

## 2015-08-21 DIAGNOSIS — A749 Chlamydial infection, unspecified: Secondary | ICD-10-CM | POA: Diagnosis present

## 2015-08-21 DIAGNOSIS — N8312 Corpus luteum cyst of left ovary: Secondary | ICD-10-CM | POA: Diagnosis not present

## 2015-08-21 DIAGNOSIS — R109 Unspecified abdominal pain: Secondary | ICD-10-CM

## 2015-08-21 DIAGNOSIS — R1032 Left lower quadrant pain: Secondary | ICD-10-CM | POA: Diagnosis present

## 2015-08-21 DIAGNOSIS — O3481 Maternal care for other abnormalities of pelvic organs, first trimester: Secondary | ICD-10-CM

## 2015-08-21 DIAGNOSIS — O98811 Other maternal infectious and parasitic diseases complicating pregnancy, first trimester: Secondary | ICD-10-CM

## 2015-08-21 DIAGNOSIS — F17210 Nicotine dependence, cigarettes, uncomplicated: Secondary | ICD-10-CM | POA: Insufficient documentation

## 2015-08-21 DIAGNOSIS — O99011 Anemia complicating pregnancy, first trimester: Secondary | ICD-10-CM

## 2015-08-21 DIAGNOSIS — O418X10 Other specified disorders of amniotic fluid and membranes, first trimester, not applicable or unspecified: Secondary | ICD-10-CM

## 2015-08-21 DIAGNOSIS — D649 Anemia, unspecified: Secondary | ICD-10-CM | POA: Insufficient documentation

## 2015-08-21 DIAGNOSIS — O468X1 Other antepartum hemorrhage, first trimester: Secondary | ICD-10-CM

## 2015-08-21 LAB — CBC
HCT: 29.4 % — ABNORMAL LOW (ref 36.0–46.0)
Hemoglobin: 9.2 g/dL — ABNORMAL LOW (ref 12.0–15.0)
MCH: 21.3 pg — ABNORMAL LOW (ref 26.0–34.0)
MCHC: 31.3 g/dL (ref 30.0–36.0)
MCV: 68.1 fL — ABNORMAL LOW (ref 78.0–100.0)
PLATELETS: 278 10*3/uL (ref 150–400)
RBC: 4.32 MIL/uL (ref 3.87–5.11)
RDW: 20.1 % — AB (ref 11.5–15.5)
WBC: 9.5 10*3/uL (ref 4.0–10.5)

## 2015-08-21 LAB — ABO/RH: ABO/RH(D): O POS

## 2015-08-21 LAB — URINALYSIS, ROUTINE W REFLEX MICROSCOPIC
Bilirubin Urine: NEGATIVE
Glucose, UA: NEGATIVE mg/dL
Hgb urine dipstick: NEGATIVE
Ketones, ur: NEGATIVE mg/dL
LEUKOCYTES UA: NEGATIVE
NITRITE: NEGATIVE
Protein, ur: NEGATIVE mg/dL
SPECIFIC GRAVITY, URINE: 1.01 (ref 1.005–1.030)
pH: 6 (ref 5.0–8.0)

## 2015-08-21 LAB — WET PREP, GENITAL
Sperm: NONE SEEN
TRICH WET PREP: NONE SEEN
YEAST WET PREP: NONE SEEN

## 2015-08-21 LAB — POCT PREGNANCY, URINE: Preg Test, Ur: POSITIVE — AB

## 2015-08-21 LAB — HCG, QUANTITATIVE, PREGNANCY: HCG, BETA CHAIN, QUANT, S: 25370 m[IU]/mL — AB (ref <5)

## 2015-08-21 MED ORDER — CONCEPT OB 130-92.4-1 MG PO CAPS: 1.0000 | ORAL_CAPSULE | Freq: Every day | ORAL | Status: DC

## 2015-08-21 MED ORDER — FERROUS SULFATE 325 (65 FE) MG PO TABS: 325.0000 mg | ORAL_TABLET | Freq: Two times a day (BID) | ORAL | Status: DC

## 2015-08-21 MED ORDER — METRONIDAZOLE 500 MG PO TABS: 500.0000 mg | ORAL_TABLET | Freq: Two times a day (BID) | ORAL | Status: DC

## 2015-08-21 NOTE — MAU Note (Signed)
Urine sent to lab 

## 2015-08-21 NOTE — MAU Provider Note (Signed)
Chief Complaint: Abdominal Pain and Possible Pregnancy   First Provider Initiated Contact with Patient 08/21/15 1311     SUBJECTIVE HPI: Cynthia Horne is a 25 y.o. G1P0 at [redacted]w[redacted]d who presents to [facility] reporting low abd and left abd pain x a few weeks that has worsened over the past two days.   No other testing this pregnancy.   Location:  low abd and left abd pain Quality: cramping Severity: 5/10 on pain scale Duration: 2-3 weeks Course: Worsening Timing: Intermittent Modifying factors: There are no aggravating or aleviating factors. Hasn't tried anything to Tx the pain.  Associated signs and symptoms: Neg for fever, chills, VB, vaginal discharge, urinary complaints or GI complaints.   Past Medical History  Diagnosis Date  . Migraine    OB History  Gravida Para Term Preterm AB SAB TAB Ectopic Multiple Living  1             # Outcome Date GA Lbr Len/2nd Weight Sex Delivery Anes PTL Lv  1 Current              Past Surgical History  Procedure Laterality Date  . Oophorectomy Right    Social History   Social History  . Marital Status: Single    Spouse Name: N/A  . Number of Children: N/A  . Years of Education: N/A   Occupational History  . Not on file.   Social History Main Topics  . Smoking status: Current Some Day Smoker -- 0.50 packs/day    Types: Cigarettes, Cigars  . Smokeless tobacco: Never Used  . Alcohol Use: No  . Drug Use: No  . Sexual Activity: Yes    Birth Control/ Protection: None   Other Topics Concern  . Not on file   Social History Narrative   No current facility-administered medications on file prior to encounter.   No current outpatient prescriptions on file prior to encounter.   No Known Allergies  I have reviewed the past Medical Hx, Surgical Hx, Social Hx, Allergies and Medications.   Review of Systems  Constitutional: Negative for fever and chills.  Gastrointestinal: Positive for abdominal pain. Negative for nausea,  vomiting, diarrhea, constipation and blood in stool.  Genitourinary: Positive for frequency. Negative for dysuria, urgency, hematuria, flank pain, vaginal bleeding and vaginal discharge.  Musculoskeletal: Negative for back pain.    OBJECTIVE Patient Vitals for the past 24 hrs:  BP Temp Temp src Pulse Resp Weight  08/21/15 1519 125/63 mmHg - - 109 18 -  08/21/15 1221 153/59 mmHg 98.2 F (36.8 C) Oral 113 18 245 lb 12 oz (111.471 kg)   Constitutional: Well-developed, well-nourished, obese female in no acute distress.  Cardiovascular: normal rate Respiratory: normal rate and effort.  GI: Abd soft, non-tender. Pos BS x 4 MS: Extremities nontender, no edema, normal ROM Neurologic: Alert and oriented x 4.  GU: Neg CVAT.  PELVIC EXAM: NEFG, moderate amount of thin, white, malodorous discharge, no blood noted, cervix closed; unable to palpate uterus due to body habitus, no adnexal tenderness or masses. No CMT.  LAB RESULTS Results for orders placed or performed during the hospital encounter of 08/21/15 (from the past 24 hour(s))  Pregnancy, urine POC     Status: Abnormal   Collection Time: 08/21/15 12:32 PM  Result Value Ref Range   Preg Test, Ur POSITIVE (A) NEGATIVE  ABO/Rh     Status: None (Preliminary result)   Collection Time: 08/21/15  1:16 PM  Result Value Ref Range  ABO/RH(D) O POS   CBC     Status: Abnormal   Collection Time: 08/21/15  1:17 PM  Result Value Ref Range   WBC 9.5 4.0 - 10.5 K/uL   RBC 4.32 3.87 - 5.11 MIL/uL   Hemoglobin 9.2 (L) 12.0 - 15.0 g/dL   HCT 14.729.4 (L) 82.936.0 - 56.246.0 %   MCV 68.1 (L) 78.0 - 100.0 fL   MCH 21.3 (L) 26.0 - 34.0 pg   MCHC 31.3 30.0 - 36.0 g/dL   RDW 13.020.1 (H) 86.511.5 - 78.415.5 %   Platelets 278 150 - 400 K/uL  hCG, quantitative, pregnancy     Status: Abnormal   Collection Time: 08/21/15  1:18 PM  Result Value Ref Range   hCG, Beta Chain, Quant, S 25370 (H) <5 mIU/mL  Wet prep, genital     Status: Abnormal   Collection Time: 08/21/15  1:30 PM   Result Value Ref Range   Yeast Wet Prep HPF POC NONE SEEN NONE SEEN   Trich, Wet Prep NONE SEEN NONE SEEN   Clue Cells Wet Prep HPF POC PRESENT (A) NONE SEEN   WBC, Wet Prep HPF POC MANY (A) NONE SEEN   Sperm NONE SEEN     IMAGING [place] Ob Comp Less 14 Wks  08/21/2015  CLINICAL DATA:  Abdominal pain EXAM: OBSTETRIC <14 WK US AND TRANSVAGINAL OB US TECHNIQUE: Both transabdominal and transvaginal ultrasound examinations were performed for complete evaluation of the gestation as well as the maternal uterus, adnexal regions, and pelvic cul-de-sac. Transvaginal technique was performed to assess early pregnancy. COMPARISON:  None. FINDINGS: Intrauterine gestational sac: Single Yolk sac:  Yes Embryo:  Yes Cardiac Activity: Yes Heart Rate: 122  bpm CRL:  4.8  mm   6 w   1 d                  US EDC: 04/14/2016 Subchorionic hemorrhage:  None visualized. Maternal uterus/adnexae: Subchorionic hemorrhage: Small subchorionic hemorrhage noted. Right ovary: Previous rightoophorectomy Left ovary: Normal.  Two corpus luteum is identified. Other :None Free fluid: A small amount of free fluid noted within the left adnexa. IMPRESSION: 1. Single living intrauterine gestation. The estimated gestational age is 6 weeks and 1 day. 2. Small amount of free fluid noted within the left adnexa. 3. Small subchorionic hemorrhage Electronically Signed   By: Signa Kellaylor  Stroud M.D.   On: 08/21/2015 14:21   [place] Ob Transvaginal  08/21/2015  CLINICAL DATA:  Abdominal pain EXAM: OBSTETRIC <14 WK US AND TRANSVAGINAL OB US TECHNIQUE: Both transabdominal and transvaginal ultrasound examinations were performed for complete evaluation of the gestation as well as the maternal uterus, adnexal regions, and pelvic cul-de-sac. Transvaginal technique was performed to assess early pregnancy. COMPARISON:  None. FINDINGS: Intrauterine gestational sac: Single Yolk sac:  Yes Embryo:  Yes Cardiac Activity: Yes Heart Rate: 122  bpm CRL:  4.8  mm   6 w   1 d                   US EDC: 04/14/2016 Subchorionic hemorrhage:  None visualized. Maternal uterus/adnexae: Subchorionic hemorrhage: Small subchorionic hemorrhage noted. Right ovary: Previous rightoophorectomy Left ovary: Normal.  Two corpus luteum is identified. Other :None Free fluid: A small amount of free fluid noted within the left adnexa. IMPRESSION: 1. Single living intrauterine gestation. The estimated gestational age is 6 weeks and 1 day. 2. Small amount of free fluid noted within the left adnexa. 3. Small subchorionic hemorrhage Electronically Signed  By: Signa Kell M.D.   On: 08/21/2015 14:21    MAU COURSE CBC, Quant, ABO/Rh, ultrasound, wet prep and GC/chlamydia culture, UA  MDM - Pain in early pregnancy likely due to CLC and BV with normal intrauterine pregnancy and hemodynamically stable. -Anemia, asymptomatic, mildly tachycardic.   ASSESSMENT 1. Corpus luteum cyst of left ovary   2. Abdominal pain affecting pregnancy, antepartum   3. Subchorionic hemorrhage in first trimester   4. Anemia complicating pregnancy, first trimester     PLAN Discharge home in stable condition. Fist trimester precautions Iron-rich diet.  Pregnancy verification letter List of OB's given Rx Flagyl, PNV, FeSO4 BID.      Follow-up Information    Follow up with Obstetrician of your choice.   Why:  Start prenatal care      Follow up with [facility].   Why:  As needed in emergencies   Contact information:   [address] [id number] mc [place] [address] [phone]       Medication List    STOP taking these medications        BC HEADACHE POWDER PO     desloratadine-pseudoephedrine 2.5-120 MG 12 hr tablet  Commonly known as:  CLARINEX-D 12-hour      TAKE these medications        CONCEPT OB 130-92.4-1 MG Caps  Take 1 tablet by mouth daily.     ferrous sulfate 325 (65 FE) MG tablet  Commonly known as:  FERROUSUL  Take  1 tablet (325 mg total) by mouth 2 (two) times daily with a meal.     metroNIDAZOLE 500 MG tablet  Commonly known as:  FLAGYL  Take 1 tablet (500 mg total) by mouth 2 (two) times daily.         [place], [place] 08/21/2015  3:45 PM  4

## 2015-08-21 NOTE — Discharge Instructions (Signed)
You have a corpus luteum cyst of your left ovary. This is a normal cyst of pregnancy. You also have a small bleed around your gestational sac. This is not dangerous to your baby.   Abdominal Pain During Pregnancy Abdominal pain is common in pregnancy. Most of the time, it does not cause harm. There are many causes of abdominal pain. Some causes are more serious than others. Some of the causes of abdominal pain in pregnancy are easily diagnosed. Occasionally, the diagnosis takes time to understand. Other times, the cause is not determined. Abdominal pain can be a sign that something is very wrong with the pregnancy, or the pain may have nothing to do with the pregnancy at all. For this reason, always tell your health care provider if you have any abdominal discomfort. HOME CARE INSTRUCTIONS  Monitor your abdominal pain for any changes. The following actions may help to alleviate any discomfort you are experiencing:  Do not have sexual intercourse or put anything in your vagina until your symptoms go away completely.  Get plenty of rest until your pain improves.  Drink clear fluids if you feel nauseous. Avoid solid food as long as you are uncomfortable or nauseous.  Only take over-the-counter or prescription medicine as directed by your health care provider.  Keep all follow-up appointments with your health care provider. SEEK IMMEDIATE MEDICAL CARE IF:  You are bleeding, leaking fluid, or passing tissue from the vagina.  You have increasing pain or cramping.  You have persistent vomiting.  You have painful or bloody urination.  You have a fever.  You notice a decrease in your baby's movements.  You have extreme weakness or feel faint.  You have shortness of breath, with or without abdominal pain.  You develop a severe headache with abdominal pain.  You have abnormal vaginal discharge with abdominal pain.  You have persistent diarrhea.  You have abdominal pain that continues  even after rest, or gets worse. MAKE SURE YOU:   Understand these instructions.  Will watch your condition.  Will get help right away if you are not doing well or get worse.   This information is not intended to replace advice given to you by your health care provider. Make sure you discuss any questions you have with your health care provider.   Document Released: 04/12/2005 Document Revised: 01/31/2013 Document Reviewed: 11/09/2012 Elsevier Interactive Patient Education 2016 [employer]  Pregnancy and Anemia Anemia is a condition in which the concentration of red blood cells or hemoglobin in the blood is below normal. Hemoglobin is a substance in red blood cells that carries oxygen to the tissues of the body. Anemia results in not enough oxygen reaching these tissues.  Anemia during pregnancy is common because the fetus uses more iron and folic acid as it is developing. Your body may not produce enough red blood cells because of this. Also, during pregnancy, the liquid part of the blood (plasma) increases by about 50%, and the red blood cells increase by only 25%. This lowers the concentration of the red blood cells and creates a natural anemia-like situation.  CAUSES  The most common cause of anemia during pregnancy is not having enough iron in the body to make red blood cells (iron deficiency anemia). Other causes may include:  Folic acid deficiency.  Vitamin B12 deficiency.  Certain prescription or over-the-counter medicines.  Certain medical conditions or infections that destroy red blood cells.  A low platelet count and bleeding caused by antibodies that go  through the placenta to the fetus from the mother's blood. SIGNS AND SYMPTOMS  Mild anemia may not be noticeable. If it becomes severe, symptoms may include:  Tiredness.  Shortness of breath, especially with exercise.  Weakness.  Fainting.  Pale looking skin.  Headaches.  Feeling a fast or irregular heartbeat  (palpitations). DIAGNOSIS  The type of anemia is usually diagnosed from your family and medical history and blood tests. TREATMENT  Treatment of anemia during pregnancy depends on the cause of the anemia. Treatment can include:  Supplements of iron, vitamin B12, or folic acid.  A blood transfusion. This may be needed if blood loss is severe.  Hospitalization. This may be needed if there is significant continual blood loss.  Dietary changes. HOME CARE INSTRUCTIONS   Follow your dietitian's or health care provider's dietary recommendations.  Increase your vitamin C intake. This will help the stomach absorb more iron.  Eat a diet rich in iron. This would include foods such as:  Liver.  Beef.  Whole grain bread.  Eggs.  Dried fruit.  Take iron and vitamins as directed by your health care provider.  Eat green leafy vegetables. These are a good source of folic acid. SEEK MEDICAL CARE IF:   You have frequent or lasting headaches.  You are looking pale.  You are bruising easily. SEEK IMMEDIATE MEDICAL CARE IF:   You have extreme weakness, shortness of breath, or chest pain.  You become dizzy or have trouble concentrating.  You have heavy vaginal bleeding.  You develop a rash.  You have bloody or black, tarry stools.  You faint.  You vomit up blood.  You vomit repeatedly.  You have abdominal pain.  You have a fever or persistent symptoms for more than 2-3 days.  You have a fever and your symptoms suddenly get worse.  You are dehydrated. MAKE SURE YOU:   Understand these instructions.  Will watch your condition.  Will get help right away if you are not doing well or get worse.   This information is not intended to replace advice given to you by your health care provider. Make sure you discuss any questions you have with your health care provider.   Document Released: 04/09/2000 Document Revised: 01/31/2013 Document Reviewed: 11/22/2012 Elsevier  Interactive Patient Education [employer]

## 2015-08-21 NOTE — MAU Note (Signed)
Period is late. Has been cramping for 2-3 wks, become serious last few nights.

## 2015-08-22 ENCOUNTER — Encounter (HOSPITAL_COMMUNITY): Payer: Self-pay | Admitting: *Deleted

## 2015-08-22 DIAGNOSIS — A749 Chlamydial infection, unspecified: Secondary | ICD-10-CM | POA: Diagnosis present

## 2015-08-22 DIAGNOSIS — O98811 Other maternal infectious and parasitic diseases complicating pregnancy, first trimester: Secondary | ICD-10-CM

## 2015-08-22 LAB — GC/CHLAMYDIA PROBE AMP (~~LOC~~) NOT AT ARMC
Chlamydia: POSITIVE — AB
Neisseria Gonorrhea: NEGATIVE

## 2015-08-22 LAB — HIV ANTIBODY (ROUTINE TESTING W REFLEX): HIV Screen 4th Generation wRfx: NONREACTIVE

## 2015-09-08 ENCOUNTER — Encounter (HOSPITAL_COMMUNITY): Payer: Self-pay | Admitting: *Deleted

## 2015-09-08 ENCOUNTER — Inpatient Hospital Stay (HOSPITAL_COMMUNITY)
Admission: AD | Admit: 2015-09-08 | Discharge: 2015-09-08 | Disposition: A | Discharge status: Home / Self Care | Payer: Medicaid Other | Source: Ambulatory Visit | Attending: Family Medicine | Admitting: Family Medicine

## 2015-09-08 DIAGNOSIS — Z79899 Other long term (current) drug therapy: Secondary | ICD-10-CM

## 2015-09-08 DIAGNOSIS — Z3A08 8 weeks gestation of pregnancy: Secondary | ICD-10-CM | POA: Diagnosis not present

## 2015-09-08 DIAGNOSIS — Z711 Person with feared health complaint in whom no diagnosis is made: Secondary | ICD-10-CM | POA: Diagnosis not present

## 2015-09-08 DIAGNOSIS — O2241 Hemorrhoids in pregnancy, first trimester: Secondary | ICD-10-CM

## 2015-09-08 DIAGNOSIS — R109 Unspecified abdominal pain: Secondary | ICD-10-CM

## 2015-09-08 DIAGNOSIS — R69 Illness, unspecified: Secondary | ICD-10-CM | POA: Diagnosis present

## 2015-09-08 DIAGNOSIS — O9989 Other specified diseases and conditions complicating pregnancy, childbirth and the puerperium: Secondary | ICD-10-CM

## 2015-09-08 DIAGNOSIS — Z87891 Personal history of nicotine dependence: Secondary | ICD-10-CM | POA: Insufficient documentation

## 2015-09-08 HISTORY — DX: Unspecified ovarian cyst, unspecified side: N83.209

## 2015-09-08 HISTORY — DX: Anemia, unspecified: D64.9

## 2015-09-08 NOTE — MAU Provider Note (Signed)
History     CSN: [id number]  Arrival date and time: 09/08/15 [id number]   First Provider Initiated Contact with Patient 09/08/15 [phone]      Chief Complaint  Patient presents with  . ? review of meds/ need    HPI   [name] is a 25 y.o. female G1P0 @ 264w1d presenting to MAU with concerns about medication she was prescribed. She was seen here in MAU at the end of April and treated for BV; she was given Flagyl. She feels the flagyl made her sick and she didn't like the way it made her feel. She feels it made her nausea symptom worse.  She says she spoke to Dr. Erin FullingHarraway-Smith on Friday, and she told her to skip her clinic appointment this morning and come to MAU instead for review of medications.  She has stopped the flagyl at this time.   Currently she has no odor to her discharge, and feels that the amount of discharge she had has decreased.   OB History    Gravida Para Term Preterm AB TAB SAB Ectopic Multiple Living   1               Past Medical History  Diagnosis Date  . Migraine   . Anemia   . Ovarian cyst     Past Surgical History  Procedure Laterality Date  . Oophorectomy Right     Family History  Problem Relation Age of Onset  . Hypertension Maternal Grandmother   . Cancer Maternal Grandfather     pancreatic    Social History  Substance Use Topics  . Smoking status: Former Smoker -- 0.50 packs/day    Types: Cigarettes, Cigars  . Smokeless tobacco: Never Used     Comment: quit with preg  . Alcohol Use: No    Allergies: No Known Allergies  Prescriptions prior to admission  Medication Sig Dispense Refill Last Dose  . ferrous sulfate (FERROUSUL) 325 (65 FE) MG tablet Take 1 tablet (325 mg total) by mouth 2 (two) times daily with a meal. 60 tablet 1 09/07/2015 at Unknown time  . Prenat w/o A Vit-FeFum-FePo-FA (CONCEPT OB) 130-92.4-1 MG CAPS Take 1 tablet by mouth daily. 30 capsule 12 09/07/2015 at Unknown time  . metroNIDAZOLE (FLAGYL) 500 MG tablet Take 1  tablet (500 mg total) by mouth 2 (two) times daily. (Patient not taking: Reported on 09/08/2015) 14 tablet 0 Not Taking at Unknown time   No results found for this or any previous visit (from the past 48 hour(s)).  Review of Systems  Constitutional: Negative for fever and chills.  Gastrointestinal: Positive for nausea (She declines the need for nausea medication at this time. ). Negative for vomiting, abdominal pain, diarrhea and constipation.  Genitourinary: Negative for dysuria and urgency.   Physical Exam   Blood pressure 121/66, pulse 83, temperature 97.7 F (36.5 C), temperature source Oral, resp. rate 16, height 5\' 4"  (1.626 m), weight 246 lb 12.8 oz (111.948 kg), last menstrual period 07/13/2015.  Physical Exam  Constitutional: She is oriented to person, place, and time. She appears well-developed and well-nourished. No distress.  HENT:  Head: Normocephalic.  Respiratory: Effort normal.  Musculoskeletal: Normal range of motion.  Neurological: She is alert and oriented to person, place, and time.  Skin: Skin is warm. She is not diaphoretic.  Psychiatric: Her behavior is normal.    MAU Course  Procedures  None  MDM   Assessment and Plan   A:  1. Physically  well but worried   2. Encounter for medication review    P:  Discharge home in stable condition Stop Flagyl  If symptoms return we discussed trying Metrogel instead of PO flagyl Return to MAU for emergencies  Call the clinic to reschedule OB appointment   Duane Lope, NP 09/08/2015 1:57 PM

## 2015-09-08 NOTE — MAU Note (Signed)
Had appt today at 0800 at clinic. Was directed to come here to have meds reviewed. Pt was prescribed an antibiotic- pt thought is was making her sleepy and feeling sick, only took it a few days.

## 2015-09-08 NOTE — Discharge Instructions (Signed)
First Trimester of Pregnancy The first trimester of pregnancy is from week 1 until the end of week 12 (months 1 through 3). A week after a sperm fertilizes an egg, the egg will implant on the wall of the uterus. This embryo will begin to develop into a baby. Genes from you and your partner are forming the baby. The female genes determine whether the baby is a boy or a girl. At 6-8 weeks, the eyes and face are formed, and the heartbeat can be seen on ultrasound. At the end of 12 weeks, all the baby's organs are formed.  Now that you are pregnant, you will want to do everything you can to have a healthy baby. Two of the most important things are to get good prenatal care and to follow your health care provider's instructions. Prenatal care is all the medical care you receive before the baby's birth. This care will help prevent, find, and treat any problems during the pregnancy and childbirth. BODY CHANGES Your body goes through many changes during pregnancy. The changes vary from woman to woman.   You may gain or lose a couple of pounds at first.  You may feel sick to your stomach (nauseous) and throw up (vomit). If the vomiting is uncontrollable, call your health care provider.  You may tire easily.  You may develop headaches that can be relieved by medicines approved by your health care provider.  You may urinate more often. Painful urination may mean you have a bladder infection.  You may develop heartburn as a result of your pregnancy.  You may develop constipation because certain hormones are causing the muscles that push waste through your intestines to slow down.  You may develop hemorrhoids or swollen, bulging veins (varicose veins).  Your breasts may begin to grow larger and become tender. Your nipples may stick out more, and the tissue that surrounds them (areola) may become darker.  Your gums may bleed and may be sensitive to brushing and flossing.  Dark spots or blotches (chloasma,  mask of pregnancy) may develop on your face. This will likely fade after the baby is born.  Your menstrual periods will stop.  You may have a loss of appetite.  You may develop cravings for certain kinds of food.  You may have changes in your emotions from day to day, such as being excited to be pregnant or being concerned that something may go wrong with the pregnancy and baby.  You may have more vivid and strange dreams.  You may have changes in your hair. These can include thickening of your hair, rapid growth, and changes in texture. Some women also have hair loss during or after pregnancy, or hair that feels dry or thin. Your hair will most likely return to normal after your baby is born. WHAT TO EXPECT AT YOUR PRENATAL VISITS During a routine prenatal visit:  You will be weighed to make sure you and the baby are growing normally.  Your blood pressure will be taken.  Your abdomen will be measured to track your baby's growth.  The fetal heartbeat will be listened to starting around week 10 or 12 of your pregnancy.  Test results from any previous visits will be discussed. Your health care provider may ask you:  How you are feeling.  If you are feeling the baby move.  If you have had any abnormal symptoms, such as leaking fluid, bleeding, severe headaches, or abdominal cramping.  If you are using any tobacco products,   including cigarettes, chewing tobacco, and electronic cigarettes.  If you have any questions. Other tests that may be performed during your first trimester include:  Blood tests to find your blood type and to check for the presence of any previous infections. They will also be used to check for low iron levels (anemia) and Rh antibodies. Later in the pregnancy, blood tests for diabetes will be done along with other tests if problems develop.  Urine tests to check for infections, diabetes, or protein in the urine.  An ultrasound to confirm the proper growth  and development of the baby.  An amniocentesis to check for possible genetic problems.  Fetal screens for spina bifida and Down syndrome.  You may need other tests to make sure you and the baby are doing well.  HIV (human immunodeficiency virus) testing. Routine prenatal testing includes screening for HIV, unless you choose not to have this test. HOME CARE INSTRUCTIONS  Medicines  Follow your health care provider's instructions regarding medicine use. Specific medicines may be either safe or unsafe to take during pregnancy.  Take your prenatal vitamins as directed.  If you develop constipation, try taking a stool softener if your health care provider approves. Diet  Eat regular, well-balanced meals. Choose a variety of foods, such as meat or vegetable-based protein, fish, milk and low-fat dairy products, vegetables, fruits, and whole grain breads and cereals. Your health care provider will help you determine the amount of weight gain that is right for you.  Avoid raw meat and uncooked cheese. These carry germs that can cause birth defects in the baby.  Eating four or five small meals rather than three large meals a day may help relieve nausea and vomiting. If you start to feel nauseous, eating a few soda crackers can be helpful. Drinking liquids between meals instead of during meals also seems to help nausea and vomiting.  If you develop constipation, eat more high-fiber foods, such as fresh vegetables or fruit and whole grains. Drink enough fluids to keep your urine clear or pale yellow. Activity and Exercise  Exercise only as directed by your health care provider. Exercising will help you:  Control your weight.  Stay in shape.  Be prepared for labor and delivery.  Experiencing pain or cramping in the lower abdomen or low back is a good sign that you should stop exercising. Check with your health care provider before continuing normal exercises.  Try to avoid standing for long  periods of time. Move your legs often if you must stand in one place for a long time.  Avoid heavy lifting.  Wear low-heeled shoes, and practice good posture.  You may continue to have sex unless your health care provider directs you otherwise. Relief of Pain or Discomfort  Wear a good support bra for breast tenderness.   Take warm sitz baths to soothe any pain or discomfort caused by hemorrhoids. Use hemorrhoid cream if your health care provider approves.   Rest with your legs elevated if you have leg cramps or low back pain.  If you develop varicose veins in your legs, wear support hose. Elevate your feet for 15 minutes, 3-4 times a day. Limit salt in your diet. Prenatal Care  Schedule your prenatal visits by the twelfth week of pregnancy. They are usually scheduled monthly at first, then more often in the last 2 months before delivery.  Write down your questions. Take them to your prenatal visits.  Keep all your prenatal visits as directed by your   health care provider. Safety  Wear your seat belt at all times when driving.  Make a list of emergency phone numbers, including numbers for family, friends, the hospital, and police and fire departments. General Tips  Ask your health care provider for a referral to a local prenatal education class. Begin classes no later than at the beginning of month 6 of your pregnancy.  Ask for help if you have counseling or nutritional needs during pregnancy. Your health care provider can offer advice or refer you to specialists for help with various needs.  Do not use hot tubs, steam rooms, or saunas.  Do not douche or use tampons or scented sanitary pads.  Do not cross your legs for long periods of time.  Avoid cat litter boxes and soil used by cats. These carry germs that can cause birth defects in the baby and possibly loss of the fetus by miscarriage or stillbirth.  Avoid all smoking, herbs, alcohol, and medicines not prescribed by  your health care provider. Chemicals in these affect the formation and growth of the baby.  Do not use any tobacco products, including cigarettes, chewing tobacco, and electronic cigarettes. If you need help quitting, ask your health care provider. You may receive counseling support and other resources to help you quit.  Schedule a dentist appointment. At home, brush your teeth with a soft toothbrush and be gentle when you floss. SEEK MEDICAL CARE IF:   You have dizziness.  You have mild pelvic cramps, pelvic pressure, or nagging pain in the abdominal area.  You have persistent nausea, vomiting, or diarrhea.  You have a bad smelling vaginal discharge.  You have pain with urination.  You notice increased swelling in your face, hands, legs, or ankles. SEEK IMMEDIATE MEDICAL CARE IF:   You have a fever.  You are leaking fluid from your vagina.  You have spotting or bleeding from your vagina.  You have severe abdominal cramping or pain.  You have rapid weight gain or loss.  You vomit blood or material that looks like coffee grounds.  You are exposed to German measles and have never had them.  You are exposed to fifth disease or chickenpox.  You develop a severe headache.  You have shortness of breath.  You have any kind of trauma, such as from a fall or a car accident.   This information is not intended to replace advice given to you by your health care provider. Make sure you discuss any questions you have with your health care provider.   Document Released: 04/06/2001 Document Revised: 05/03/2014 Document Reviewed: 02/20/2013 Elsevier Interactive Patient Education 2016 [employer]  

## 2015-09-10 ENCOUNTER — Encounter (HOSPITAL_COMMUNITY): Payer: Self-pay

## 2015-09-10 ENCOUNTER — Inpatient Hospital Stay (HOSPITAL_COMMUNITY)
Admission: AD | Admit: 2015-09-10 | Discharge: 2015-09-10 | Disposition: A | Discharge status: Home / Self Care | Payer: Medicaid Other | Source: Ambulatory Visit | Attending: Obstetrics and Gynecology | Admitting: Obstetrics and Gynecology

## 2015-09-10 DIAGNOSIS — R102 Pelvic and perineal pain: Secondary | ICD-10-CM | POA: Insufficient documentation

## 2015-09-10 DIAGNOSIS — Z87891 Personal history of nicotine dependence: Secondary | ICD-10-CM | POA: Diagnosis not present

## 2015-09-10 DIAGNOSIS — O2241 Hemorrhoids in pregnancy, first trimester: Secondary | ICD-10-CM | POA: Insufficient documentation

## 2015-09-10 DIAGNOSIS — R109 Unspecified abdominal pain: Secondary | ICD-10-CM | POA: Diagnosis present

## 2015-09-10 DIAGNOSIS — N949 Unspecified condition associated with female genital organs and menstrual cycle: Secondary | ICD-10-CM

## 2015-09-10 DIAGNOSIS — Z3A08 8 weeks gestation of pregnancy: Secondary | ICD-10-CM | POA: Insufficient documentation

## 2015-09-10 LAB — URINALYSIS, ROUTINE W REFLEX MICROSCOPIC
BILIRUBIN URINE: NEGATIVE
Glucose, UA: NEGATIVE mg/dL
Hgb urine dipstick: NEGATIVE
Ketones, ur: NEGATIVE mg/dL
NITRITE: NEGATIVE
PH: 5.5 (ref 5.0–8.0)
Protein, ur: NEGATIVE mg/dL
Specific Gravity, Urine: >1.03 — ABNORMAL HIGH (ref 1.005–1.030)

## 2015-09-10 LAB — URINE MICROSCOPIC-ADD ON: RBC / HPF: NONE SEEN RBC/hpf (ref 0–5)

## 2015-09-10 MED ORDER — HYDROCORTISONE ACETATE 25 MG RE SUPP: 25.0000 mg | Freq: Two times a day (BID) | RECTAL | Status: DC

## 2015-09-10 NOTE — MAU Note (Signed)
Pt states she began having mid bilateral abd pain at 5 pm. Also reports noted blood in her BM, states she has not had diarrhea.

## 2015-09-10 NOTE — MAU Provider Note (Signed)
History     CSN: [id number]  Arrival date and time: 09/10/15 1933   First Provider Initiated Contact with Patient 09/10/15 2018      Chief Complaint  Patient presents with  . Abdominal Pain  . Rectal Bleeding   Abdominal Pain This is a new problem. The current episode started today. The onset quality is gradual. The problem occurs constantly. The pain is located in the suprapubic region. The pain is at a severity of 9/10. The quality of the pain is sharp. The abdominal pain does not radiate. Associated symptoms include hematochezia and nausea. Pertinent negatives include no constipation, diarrhea, dysuria, fever, frequency or vomiting. The pain is aggravated by deep breathing. The pain is relieved by nothing. She has tried nothing for the symptoms.  Rectal Bleeding  The current episode started today. The problem occurs rarely. The problem has been unchanged. The stool is described as streaked with blood and soft. There was no prior successful therapy. There was no prior unsuccessful therapy. Associated symptoms include abdominal pain and nausea. Pertinent negatives include no fever, no diarrhea and no vomiting. She has been behaving normally.     Past Medical History  Diagnosis Date  . Migraine   . Anemia   . Ovarian cyst     Past Surgical History  Procedure Laterality Date  . Oophorectomy Right     Family History  Problem Relation Age of Onset  . Hypertension Maternal Grandmother   . Cancer Maternal Grandfather     pancreatic    Social History  Substance Use Topics  . Smoking status: Former Smoker -- 0.50 packs/day    Types: Cigarettes, Cigars  . Smokeless tobacco: Never Used     Comment: quit with preg  . Alcohol Use: No    Allergies: No Known Allergies  Prescriptions prior to admission  Medication Sig Dispense Refill Last Dose  . ferrous sulfate (FERROUSUL) 325 (65 FE) MG tablet Take 1 tablet (325 mg total) by mouth 2 (two) times daily with a meal. 60 tablet 1  09/07/2015 at Unknown time  . Prenat w/o A Vit-FeFum-FePo-FA (CONCEPT OB) 130-92.4-1 MG CAPS Take 1 tablet by mouth daily. 30 capsule 12 09/07/2015 at Unknown time    Review of Systems  Constitutional: Negative for fever and chills.  Gastrointestinal: Positive for nausea, abdominal pain, blood in stool and hematochezia. Negative for vomiting, diarrhea and constipation.  Genitourinary: Negative for dysuria, urgency and frequency.   Physical Exam   Blood pressure 130/48, pulse 96, temperature 98.5 F (36.9 C), temperature source Oral, resp. rate 18, height 5\' 4"  (1.626 m), weight 112.038 kg (247 lb), last menstrual period 07/13/2015, SpO2 100 %.  Physical Exam  Nursing note and vitals reviewed. Constitutional: She is oriented to person, place, and time. She appears well-developed and well-nourished. No distress.  HENT:  Head: Normocephalic.  Cardiovascular: Normal rate.   Respiratory: Effort normal.  GI: Soft. There is no tenderness. There is no rebound.  Genitourinary: Rectal exam shows internal hemorrhoid. Rectal exam shows no external hemorrhoid, no mass and no tenderness.   External: no lesion Vagina: small amount of white discharge Cervix: pink, smooth, no CMT Uterus: AGA   Neurological: She is alert and oriented to person, place, and time.  Skin: Skin is warm and dry.  Psychiatric: She has a normal mood and affect.    MAU Course  Procedures  MDM  Assessment and Plan   1. Hemorrhoids during pregnancy in first trimester   2. Round ligament pain  DC home Comfort measures reviewed  1st Trimester precautions  Bleeding precautions RX: anusol  suppositories #12  Return to MAU as needed FU with OB as planned  Follow-up Information    Follow up with [facility].   Specialty:  Obstetrics and Gynecology   Why:  As scheduled   Contact information:   [address] [place] [address] [phone]        Tawnya Crook 09/10/2015, 8:33 PM

## 2015-09-10 NOTE — Discharge Instructions (Signed)
Hemorrhoids °Hemorrhoids are swollen veins around the rectum or anus. There are two types of hemorrhoids:  °· Internal hemorrhoids. These occur in the veins just inside the rectum. They may poke through to the outside and become irritated and painful. °· External hemorrhoids. These occur in the veins outside the anus and can be felt as a painful swelling or hard lump near the anus. °CAUSES °· Pregnancy.   °· Obesity.   °· Constipation or diarrhea.   °· Straining to have a bowel movement.   °· Sitting for long periods on the toilet. °· Heavy lifting or other activity that caused you to strain. °· Anal intercourse. °SYMPTOMS  °· Pain.   °· Anal itching or irritation.   °· Rectal bleeding.   °· Fecal leakage.   °· Anal swelling.   °· One or more lumps around the anus.   °DIAGNOSIS  °Your caregiver may be able to diagnose hemorrhoids by visual examination. Other examinations or tests that may be performed include:  °· Examination of the rectal area with a gloved hand (digital rectal exam).   °· Examination of anal canal using a small tube (scope).   °· A blood test if you have lost a significant amount of blood. °· A test to look inside the colon (sigmoidoscopy or colonoscopy). °TREATMENT °Most hemorrhoids can be treated at home. However, if symptoms do not seem to be getting better or if you have a lot of rectal bleeding, your caregiver may perform a procedure to help make the hemorrhoids get smaller or remove them completely. Possible treatments include:  °· Placing a rubber band at the base of the hemorrhoid to cut off the circulation (rubber band ligation).   °· Injecting a chemical to shrink the hemorrhoid (sclerotherapy).   °· Using a tool to burn the hemorrhoid (infrared light therapy).   °· Surgically removing the hemorrhoid (hemorrhoidectomy).   °· Stapling the hemorrhoid to block blood flow to the tissue (hemorrhoid stapling).   °HOME CARE INSTRUCTIONS  °· Eat foods with fiber, such as whole grains, beans,  nuts, fruits, and vegetables. Ask your doctor about taking products with added fiber in them (fiber supplements). °· Increase fluid intake. Drink enough water and fluids to keep your urine clear or pale yellow.   °· Exercise regularly.   °· Go to the bathroom when you have the urge to have a bowel movement. Do not wait.   °· Avoid straining to have bowel movements.   °· Keep the anal area dry and clean. Use wet toilet paper or moist towelettes after a bowel movement.   °· Medicated creams and suppositories may be used or applied as directed.   °· Only take over-the-counter or prescription medicines as directed by your caregiver.   °· Take warm sitz baths for 15-20 minutes, 3-4 times a day to ease pain and discomfort.   °· Place ice packs on the hemorrhoids if they are tender and swollen. Using ice packs between sitz baths may be helpful.   °¨ Put ice in a plastic bag.   °¨ Place a towel between your skin and the bag.   °¨ Leave the ice on for 15-20 minutes, 3-4 times a day.   °· Do not use a donut-shaped pillow or sit on the toilet for long periods. This increases blood pooling and pain.   °SEEK MEDICAL CARE IF: °· You have increasing pain and swelling that is not controlled by treatment or medicine. °· You have uncontrolled bleeding. °· You have difficulty or you are unable to have a bowel movement. °· You have pain or inflammation outside the area of the hemorrhoids. °MAKE SURE YOU: °· Understand these instructions. °·   Will watch your condition.  Will get help right away if you are not doing well or get worse.   This information is not intended to replace advice given to you by your health care provider. Make sure you discuss any questions you have with your health care provider.   Document Released: 04/09/2000 Document Revised: 03/29/2012 Document Reviewed: 02/15/2012 Elsevier Interactive Patient Education 2016 [employer] Inc[employer]  Round Ligament Pain during Pregnancy Many women will experience a type of  pain referred to as round ligament pain during their pregnancy. This is associated with abdominal pain or discomfort. Since any type of abdominal pain during pregnancy can be disconcerting, it is important to talk about round ligament pain to relieve any anxiety or fears you may have regarding the symptoms you are feeling. Round ligament pain is due to normal changes that take place in the body during pregnancy. It is caused by stretching of the round ligaments attached to the uterus. More commonly it occurs on the right side of the pelvis. Round Ligament: An Overview Typically in the non-pregnant state the uterus is about the size of an apple or pear. There are thick ligaments which hold the uterus in place in the abdomen, referred to as round ligaments. During pregnancy, your uterus will expand in size and weight, and the ligaments supporting it will have to stretch, becoming longer and thinner. As these ligaments pull and tug they may irritate nearby nerve fibers, which causes pain. The severity of the pain in some cases can seem extreme. Some common symptoms of round ligament pain include:  Ligament spasms or contractions/cramps that trigger a sharp pain typically on the right side of the abdomen.  Pain upon waking or suddenly rolling over in your sleep.  Pain in the abdomen that is sharp brought on by exercise or other vigorous activity. Similar Problems Round ligament pain is often mistaken for other medical conditions because the symptoms are similar. Acute abdominal pain during pregnancy may also be a sign of other conditions including:  Abdominal cramps - Some abdominal pain is simply caused by change in bowel habits associated with pregnancy. Gas is a common problem that can cause sharp, shooting pain. You should always seek out medical care if your pain is accompanied by fever, chills, pain upon urination or if you have difficulty walking. Further exams and tests will be  conducted to ensure that you do not have a more serious condition. It is not uncommon for women with lower abdominal pain to have a urinary tract infection, thus you may also be asked for a urine sample. Treatment If all other conditions are ruled out you can treat your round ligament pain relatively easily. You may be advised to take some acetaminophen (Tylenol) to reduce the severity of any persistent pain and asked to reduce your activity level. You can apply a heating pad to the area of pain or take a warm bath. Lying on the opposite side of the pain may help as well. Most women will find relief from round ligament pain simply by altering their daily routines slightly. The good news is round ligament pain will disappear completely once you have given birth to your child!

## 2015-10-08 ENCOUNTER — Other Ambulatory Visit (HOSPITAL_COMMUNITY)
Admission: RE | Admit: 2015-10-08 | Discharge: 2015-10-08 | Disposition: A | Discharge status: Home / Self Care | Payer: Medicaid Other | Source: Ambulatory Visit | Attending: Family | Admitting: Family

## 2015-10-08 ENCOUNTER — Ambulatory Visit (INDEPENDENT_AMBULATORY_CARE_PROVIDER_SITE_OTHER): Payer: Medicaid Other | Admitting: Family

## 2015-10-08 ENCOUNTER — Encounter: Payer: Self-pay | Admitting: Family

## 2015-10-08 VITALS — BP 105/57 | HR 79 | Wt 252.2 lb

## 2015-10-08 DIAGNOSIS — Z113 Encounter for screening for infections with a predominantly sexual mode of transmission: Secondary | ICD-10-CM | POA: Insufficient documentation

## 2015-10-08 DIAGNOSIS — Z01419 Encounter for gynecological examination (general) (routine) without abnormal findings: Secondary | ICD-10-CM | POA: Diagnosis present

## 2015-10-08 DIAGNOSIS — Z3490 Encounter for supervision of normal pregnancy, unspecified, unspecified trimester: Secondary | ICD-10-CM | POA: Insufficient documentation

## 2015-10-08 DIAGNOSIS — Z3482 Encounter for supervision of other normal pregnancy, second trimester: Secondary | ICD-10-CM

## 2015-10-08 DIAGNOSIS — O99210 Obesity complicating pregnancy, unspecified trimester: Secondary | ICD-10-CM | POA: Insufficient documentation

## 2015-10-08 DIAGNOSIS — O98811 Other maternal infectious and parasitic diseases complicating pregnancy, first trimester: Secondary | ICD-10-CM

## 2015-10-08 DIAGNOSIS — O99011 Anemia complicating pregnancy, first trimester: Secondary | ICD-10-CM

## 2015-10-08 DIAGNOSIS — A749 Chlamydial infection, unspecified: Secondary | ICD-10-CM

## 2015-10-08 DIAGNOSIS — Z3492 Encounter for supervision of normal pregnancy, unspecified, second trimester: Secondary | ICD-10-CM

## 2015-10-08 DIAGNOSIS — O99212 Obesity complicating pregnancy, second trimester: Secondary | ICD-10-CM

## 2015-10-08 DIAGNOSIS — O99211 Obesity complicating pregnancy, first trimester: Secondary | ICD-10-CM

## 2015-10-08 DIAGNOSIS — D649 Anemia, unspecified: Secondary | ICD-10-CM | POA: Diagnosis not present

## 2015-10-08 DIAGNOSIS — O99019 Anemia complicating pregnancy, unspecified trimester: Secondary | ICD-10-CM

## 2015-10-08 DIAGNOSIS — E669 Obesity, unspecified: Secondary | ICD-10-CM

## 2015-10-08 LAB — POCT URINALYSIS DIP (DEVICE)
Bilirubin Urine: NEGATIVE
Glucose, UA: NEGATIVE mg/dL
Ketones, ur: NEGATIVE mg/dL
Leukocytes, UA: NEGATIVE
Nitrite: NEGATIVE
Protein, ur: NEGATIVE mg/dL
Specific Gravity, Urine: 1.015 (ref 1.005–1.030)
Urobilinogen, UA: 0.2 mg/dL (ref 0.0–1.0)
pH: 7 (ref 5.0–8.0)

## 2015-10-08 NOTE — Patient Instructions (Signed)

## 2015-10-08 NOTE — Progress Notes (Signed)
   Subjective:    Cynthia Horne is a G1P0 622w3d being seen today for her first obstetrical visit.  Her obstetrical history is significant for obesity and anemia and chlamydia earlier in pregnancy. Patient does intend to breast feed. Pregnancy history fully reviewed.  Patient reports no complaints.  Filed Vitals:   10/08/15 0801  BP: 105/57  Pulse: 79  Weight: 252 lb 3.2 oz (114.397 kg)    HISTORY: OB History  Gravida Para Term Preterm AB SAB TAB Ectopic Multiple Living  1             # Outcome Date GA Lbr Len/2nd Weight Sex Delivery Anes PTL Lv  1 Current              Past Medical History  Diagnosis Date  . Migraine   . Anemia   . Ovarian cyst    Past Surgical History  Procedure Laterality Date  . Oophorectomy Right    Family History  Problem Relation Age of Onset  . Hypertension Maternal Grandmother   . Cancer Maternal Grandfather     pancreatic     Exam    BP 105/57 mmHg  Pulse 79  Wt 252 lb 3.2 oz (114.397 kg)  LMP 07/13/2015 (Exact Date) Uterine Size: size equals dates  Pelvic Exam:    Perineum: No Hemorrhoids, Normal Perineum   Vulva: normal   Vagina:  normal mucosa, normal discharge, no palpable nodules   pH: Not done   Cervix: no bleeding following Pap, no cervical motion tenderness and no lesions   Adnexa: normal adnexa and no mass, fullness, tenderness   Bony Pelvis: Adequate  System: Breast:  No nipple retraction or dimpling, No nipple discharge or bleeding, No axillary or supraclavicular adenopathy, Normal to palpation without dominant masses   Skin: normal coloration and turgor, no rashes    Neurologic: negative   Extremities: normal strength, tone, and muscle mass   HEENT neck supple with midline trachea and thyroid without masses   Mouth/Teeth mucous membranes moist, pharynx normal without lesions   Neck supple and no masses   Cardiovascular: regular rate and rhythm, no murmurs or gallops   Respiratory:  appears well, vitals normal, no  respiratory distress, acyanotic, normal RR, neck free of mass or lymphadenopathy, chest clear, no wheezing, crepitations, rhonchi, normal symmetric air entry   Abdomen: soft, non-tender; bowel sounds normal; no masses,  no organomegaly   Urinary: urethral meatus normal    Assessment:    Pregnancy: G1P0 Patient Active Problem List   Diagnosis Date Noted  . Anemia affecting pregnancy, antepartum 10/08/2015  . Supervision of normal pregnancy, antepartum 10/08/2015  . Chlamydia infection affecting pregnancy in first trimester, antepartum 08/22/2015        Plan:     Initial labs drawn. Pap smear collected. Chlamydia TOC with pap. Prenatal vitamins. Continue ferrous sulfate. Problem list reviewed and updated. Discussed appropriate weight gain in pregnancy. Genetic Screening discussed First Screen: ordered. > no appt available  Follow up in 4 weeks.  Marlis EdelsonKARIM, WALIDAH N 10/08/2015

## 2015-10-08 NOTE — Addendum Note (Signed)
Addended by: Garret ReddishBARNES, Shantavia Jha M on: 10/08/2015 01:40 PM   Modules accepted: Orders

## 2015-10-08 NOTE — Addendum Note (Signed)
Addended by: Sherre LainASH, Dina Warbington A on: 10/08/2015 08:50 AM   Modules accepted: Orders

## 2015-10-09 LAB — PRENATAL PROFILE (SOLSTAS)
Antibody Screen: NEGATIVE
BASOS PCT: 0 %
Basophils Absolute: 0 cells/uL (ref 0–200)
Eosinophils Absolute: 80 cells/uL (ref 15–500)
Eosinophils Relative: 1 %
HEMATOCRIT: 36.8 % (ref 35.0–45.0)
HEP B S AG: NEGATIVE
HIV: NONREACTIVE
Hemoglobin: 11.6 g/dL — ABNORMAL LOW (ref 11.7–15.5)
LYMPHS ABS: 1600 {cells}/uL (ref 850–3900)
Lymphocytes Relative: 20 %
MCH: 25.2 pg — ABNORMAL LOW (ref 27.0–33.0)
MCHC: 31.5 g/dL — ABNORMAL LOW (ref 32.0–36.0)
MCV: 80 fL (ref 80.0–100.0)
MONO ABS: 640 {cells}/uL (ref 200–950)
Monocytes Relative: 8 %
Neutro Abs: 5680 cells/uL (ref 1500–7800)
Neutrophils Relative %: 71 %
Platelets: 241 10*3/uL (ref 140–400)
RBC: 4.6 MIL/uL (ref 3.80–5.10)
RDW: 25.8 % — AB (ref 11.0–15.0)
RH TYPE: POSITIVE
Rubella: 3.23 Index — ABNORMAL HIGH (ref <0.90)
WBC: 8 10*3/uL (ref 3.8–10.8)

## 2015-10-09 LAB — CYTOLOGY - PAP

## 2015-10-09 LAB — GC/CHLAMYDIA PROBE AMP (~~LOC~~) NOT AT ARMC
CHLAMYDIA, DNA PROBE: POSITIVE — AB
NEISSERIA GONORRHEA: NEGATIVE

## 2015-10-09 LAB — CULTURE, OB URINE
COLONY COUNT: NO GROWTH
ORGANISM ID, BACTERIA: NO GROWTH

## 2015-10-10 ENCOUNTER — Telehealth: Payer: Self-pay | Admitting: *Deleted

## 2015-10-10 DIAGNOSIS — O98819 Other maternal infectious and parasitic diseases complicating pregnancy, unspecified trimester: Principal | ICD-10-CM

## 2015-10-10 DIAGNOSIS — A749 Chlamydial infection, unspecified: Secondary | ICD-10-CM

## 2015-10-10 MED ORDER — AZITHROMYCIN 250 MG PO TABS: 1000.0000 mg | ORAL_TABLET | Freq: Once | ORAL | Status: DC

## 2015-10-10 NOTE — Telephone Encounter (Signed)
Patient is positive for chlamydia. Prescription for azithromycin 1gm po x1 dose sent to pharmacy of record. Attempted to call patient, no answer. Voice mail left stating I am calling with test results, please return my call asap. STD card completed/sent.

## 2015-10-10 NOTE — Telephone Encounter (Signed)
Pt returned call and I informed her of results and the need to go to her pharmacy for treatment.  I also advised pt to make sure that her partner(s) get treated as well. Pt stated understanding with no further questions.

## 2015-10-11 LAB — GLUCOSE TOLERANCE, 1 HOUR (50G) W/O FASTING: Glucose, 1 Hr, gestational: 64 mg/dL — ABNORMAL LOW (ref <140)

## 2015-10-13 LAB — CP5000051 PDM PROFILE
Amphetamines: NEGATIVE ng/mL (ref <500)
Barbiturates: NEGATIVE ng/mL (ref <300)
Benzodiazepines: NEGATIVE ng/mL (ref <100)
Buprenorphine: NEGATIVE ng/mL (ref <5)
COCAINE METABOLITE: NEGATIVE ng/mL (ref <150)
Desmethyltramadol: NEGATIVE ng/mL (ref <100)
Fentanyl: NEGATIVE ng/mL (ref <0.5)
MARIJUANA METABOLITE: NEGATIVE ng/mL (ref <20)
MDMA: NEGATIVE ng/mL (ref <500)
METHADONE METABOLITE: NEGATIVE ng/mL (ref <100)
Meperidine: NEGATIVE ng/mL (ref <100)
Meprobamate: NEGATIVE ng/mL (ref <1000)
NORTAPENTADOL: NEGATIVE ng/mL (ref <50)
Norfentanyl: NEGATIVE ng/mL (ref <0.5)
Normeperidine: NEGATIVE ng/mL (ref <100)
Opiates: NEGATIVE ng/mL (ref <100)
Oxycodone: NEGATIVE ng/mL (ref <100)
PROPOXYPHENE: NEGATIVE ng/mL (ref <300)
Phencyclidine: NEGATIVE ng/mL (ref <25)
TAPENTADOL: NEGATIVE ng/mL (ref <50)
Tramadol: NEGATIVE ng/mL (ref <100)
ZOLPIDEM METABOLITE: NEGATIVE ng/mL (ref <5)
ZOLPIDEM: NEGATIVE ng/mL (ref <5)

## 2015-10-31 ENCOUNTER — Encounter: Payer: Self-pay | Admitting: *Deleted

## 2015-11-05 ENCOUNTER — Ambulatory Visit (INDEPENDENT_AMBULATORY_CARE_PROVIDER_SITE_OTHER): Payer: Medicaid Other | Admitting: Clinical

## 2015-11-05 ENCOUNTER — Ambulatory Visit (INDEPENDENT_AMBULATORY_CARE_PROVIDER_SITE_OTHER): Payer: Medicaid Other | Admitting: Certified Nurse Midwife

## 2015-11-05 VITALS — BP 119/69 | HR 88 | Temp 98.0°F | Wt 253.3 lb

## 2015-11-05 DIAGNOSIS — Z3482 Encounter for supervision of other normal pregnancy, second trimester: Secondary | ICD-10-CM

## 2015-11-05 DIAGNOSIS — O99212 Obesity complicating pregnancy, second trimester: Secondary | ICD-10-CM

## 2015-11-05 DIAGNOSIS — Z3490 Encounter for supervision of normal pregnancy, unspecified, unspecified trimester: Secondary | ICD-10-CM

## 2015-11-05 DIAGNOSIS — G43109 Migraine with aura, not intractable, without status migrainosus: Secondary | ICD-10-CM | POA: Insufficient documentation

## 2015-11-05 DIAGNOSIS — O98312 Other infections with a predominantly sexual mode of transmission complicating pregnancy, second trimester: Secondary | ICD-10-CM

## 2015-11-05 DIAGNOSIS — G43119 Migraine with aura, intractable, without status migrainosus: Secondary | ICD-10-CM

## 2015-11-05 DIAGNOSIS — O99012 Anemia complicating pregnancy, second trimester: Secondary | ICD-10-CM

## 2015-11-05 DIAGNOSIS — O99340 Other mental disorders complicating pregnancy, unspecified trimester: Secondary | ICD-10-CM | POA: Insufficient documentation

## 2015-11-05 DIAGNOSIS — D649 Anemia, unspecified: Secondary | ICD-10-CM

## 2015-11-05 DIAGNOSIS — A749 Chlamydial infection, unspecified: Secondary | ICD-10-CM

## 2015-11-05 DIAGNOSIS — F419 Anxiety disorder, unspecified: Secondary | ICD-10-CM

## 2015-11-05 DIAGNOSIS — F4323 Adjustment disorder with mixed anxiety and depressed mood: Secondary | ICD-10-CM | POA: Diagnosis not present

## 2015-11-05 DIAGNOSIS — E669 Obesity, unspecified: Secondary | ICD-10-CM

## 2015-11-05 DIAGNOSIS — O99019 Anemia complicating pregnancy, unspecified trimester: Secondary | ICD-10-CM

## 2015-11-05 DIAGNOSIS — O98811 Other maternal infectious and parasitic diseases complicating pregnancy, first trimester: Secondary | ICD-10-CM

## 2015-11-05 DIAGNOSIS — O99342 Other mental disorders complicating pregnancy, second trimester: Secondary | ICD-10-CM

## 2015-11-05 HISTORY — DX: Migraine with aura, not intractable, without status migrainosus: G43.109

## 2015-11-05 LAB — POCT URINALYSIS DIP (DEVICE)
Bilirubin Urine: NEGATIVE
GLUCOSE, UA: NEGATIVE mg/dL
HGB URINE DIPSTICK: NEGATIVE
Ketones, ur: NEGATIVE mg/dL
Leukocytes, UA: NEGATIVE
NITRITE: NEGATIVE
PROTEIN: NEGATIVE mg/dL
SPECIFIC GRAVITY, URINE: 1.015 (ref 1.005–1.030)
UROBILINOGEN UA: 0.2 mg/dL (ref 0.0–1.0)
pH: 7.5 (ref 5.0–8.0)

## 2015-11-05 MED ORDER — BUTALBITAL-APAP-CAFFEINE 50-325-40 MG PO TABS: 1.0000 | ORAL_TABLET | Freq: Four times a day (QID) | ORAL | Status: DC | PRN

## 2015-11-05 MED ORDER — FERROUS SULFATE 325 (65 FE) MG PO TABS: 325.0000 mg | ORAL_TABLET | Freq: Two times a day (BID) | ORAL | Status: DC

## 2015-11-05 NOTE — Progress Notes (Signed)
Subjective:  Cynthia Horne is a 25 y.o. G1P0 at 3356w3d being seen today for ongoing prenatal care.  She is currently monitored for the following issues for this low-risk pregnancy and has Chlamydia infection affecting pregnancy in first trimester, antepartum; Anemia affecting pregnancy, antepartum; Supervision of normal pregnancy, antepartum; Obesity affecting pregnancy, antepartum; Migraine headache with aura; and Anxiety in pregnancy, antepartum on her problem list.  Patient reports backache, heartburn and Migraine (yesterday).  Contractions: Not present. Vag. Bleeding: None.  Movement: Present. Denies leaking of fluid.   The following portions of the patient's history were reviewed and updated as appropriate: allergies, current medications, past family history, past medical history, past social history, past surgical history and problem list. Problem list updated.  Objective:   Filed Vitals:   11/05/15 0929  BP: 119/69  Pulse: 88  Temp: 98 F (36.7 C)  Weight: 253 lb 4.8 oz (114.896 kg)    Fetal Status: Fetal Heart Rate (bpm): 150 Fundal Height: 16 cm Movement: Present     General:  Alert, oriented and cooperative. Patient is in no acute distress.  Skin: Skin is warm and dry. No rash noted.   Cardiovascular: Normal heart rate noted  Respiratory: Normal respiratory effort, no problems with respiration noted  Abdomen: Soft, gravid, appropriate for gestational age. Pain/Pressure: Present     Pelvic: Vag. Bleeding: None Vag D/C Character: White   Cervical exam deferred        Extremities: Normal range of motion.  Edema: Trace  Mental Status: Normal mood and affect. Normal behavior. Normal judgment and thought content.   Urinalysis: Urine Protein: Negative Urine Glucose: Negative  Assessment and Plan:  Pregnancy: G1P0 at 6056w3d   1. Chlamydia infection affecting pregnancy in first trimester, antepartum - completed Azithro on 10/12/15 - GC/Chlamydia probe amp ([place]  2. Anemia affecting pregnancy, antepartum - refill FeSO4  3. Supervision of normal pregnancy, antepartum, second trimester - Second trimester anticipatory guidance - Hemoglobinopathy Evaluation - GC/Chlamydia probe amp ([place])[facility]  4. Obesity affecting pregnancy, antepartum, second trimester - weight stable  5. Intractable migraine with aura without status migrainosus - Rx for Fioricet  6. Anxiety in pregnancy, antepartum, second trimester - behavioral health assessment today - consider meds if sx persist  Preterm labor symptoms and general obstetric precautions including but not limited to vaginal bleeding, contractions, leaking of fluid and fetal movement were reviewed in detail with the patient. Please refer to After Visit Summary for other counseling recommendations.  Return in about 4 weeks (around 12/03/2015).   Donette LarryMelanie Dorismar Chay, CNM

## 2015-11-05 NOTE — Patient Instructions (Signed)
AREA PEDIATRIC/FAMILY PRACTICE PHYSICIANS  [facility] [address] [place], [place] [address] Phone - [phone]   Fax - [phone]  [facility] [address] [place], [place]  [address] Phone - [phone]   Fax - [phone]  [facility] [address] [place], [place]  [address] Phone - [phone]   Fax - [phone]  [facility] [address] [place], [place]  [address] Phone - [phone]   Fax - [phone]  [facility] [address] [place], [place]  [address] Phone - [phone]   Fax - [phone]  [facility] [address] [place], [place]  [address] Phone - [phone]   Fax - [phone]  [facility] [address] [place], [place]  [address] Phone - [phone]   Fax - [phone]  [facility] [address] [place], [place]  [address] Phone - [phone]   Fax - [phone]  [facility] [address] [place], [place]  [address] Phone - [phone]   Fax - [phone] [facility] [address] [place], [place]  [address] Phone - [phone]   Fax - [phone]  [facility] [address] [place], [place]  [address] Phone - [phone]   Fax - [phone]  [facility] [address] [place], [place]  [address] Phone - [phone]   Fax - [phone]  [facility] [address] [place], [place]  [address] Phone - [phone]   Fax - [phone]  [facility] GOSR[facility] [address] [place], [place]  [address] Phone - [phone]  [facility] [address] [place], [place]  [address] Phone - [phone]   Fax - [phone]  [facility] [address] [place], [place]  [address] Phone - [phone]   Fax - [phone]  [facility] [address] [place], [place]  [address] Phone - [phone]   Fax - [phone]  [facility] [address] [place], [place]  [address] Phone - [phone]   Fax - [phone]   [facility] [address] [place], [place]  [address] Phone - [phone]   Fax - [phone]  [facility] [address] [place], [place]  [address] Phone - [phone]   Fax - [phone]  [facility] RUB[facility] [address] [place], [place]  [address] Phone - [phone]   Fax - [phone]  [facility] [address] [place], [place]  [address] Phone - [phone]   Fax - [phone]  [facility] [address] , [place]  [address] Phone - [phone]   Fax - [phone] [facility] [address] [place], [place]  [address] Phone - [phone]   Fax - [phone]  [facility] [address] [place], [place]  [address] Phone - [phone]   Fax - [phone]  [facility] [address] [place], [place]  [address] Phone - [phone]   Fax - [phone]   

## 2015-11-05 NOTE — Progress Notes (Signed)
Pt has concerns about her migraines and what medications are safe to take in pregnancy and pt also c/o pain in her back but only on the left side.  Pt states she has really bad anxiety questions if she is depressed because she cries a lot for no reason.  Pt will see Asher MuirJamie today at her visit.

## 2015-11-05 NOTE — Progress Notes (Signed)
  ASSESSMENT: Pt currently experiencing Adjustment disorder with mixed anxious and depressed mood. [facility]. Pt would benefit from psychoeducation and brief therapeutic interventions regarding coping with symptoms of anxiety and depression. Stage of Change: contemplative   PLAN: 1. F/U with behavioral health clinician in one month, or as needed 2. Psychiatric Medications: none 3. Behavioral recommendations:   -Continue to talk to grandmother daily, as needed -Practice CALM relaxation breathing technique prior to work daily -Scientist, product/process developmentead educational material regarding coping with symptoms of anxiety and depression -Consider "worry hour" technique, as discussed in office visit  SUBJECTIVE: Pt. referred by Donette LarryMelanie Bhambri, CNM, for symptoms of anxiety and depression. Pt. reports the following symptoms/concerns: Pt states that she has felt an increase in anxiety in current pregnancy, and has experienced symptoms of depression and anxiety two years prior because of life stressors. Pt copes by talking to supportive grandmother; family thinks she should be on medication for anxiety, but pt does not.   Duration of problem: Over one month Severity: moderate   OBJECTIVE: Orientation & Cognition: Oriented x3. Thought processes normal and appropriate to situation. Mood: appropriate Affect: appropriate Appearance: appropriate Risk of harm to self or others: no known risk of harm to self or others Substance use: none Assessments administered: PHQ9: 12/ GAD7: 16  Diagnosis: Adjustment disorder with mixed anxious and depressed mood CPT Code: F43.23  -------------------------------------------- Other(s) present in the room: FOB  Time spent with patient in exam room: 30 minutes 9: 40 to 10: 10am   Depression screen PHQ 2/9 11/05/2015  Decreased Interest 1  Down, Depressed, Hopeless 2  PHQ - 2 Score 3  Altered sleeping 3  Tired, decreased energy 3  Change in appetite 2  Feeling  bad or failure about yourself  1  Trouble concentrating 0  Moving slowly or fidgety/restless 0  Suicidal thoughts 0  PHQ-9 Score 12    GAD 7 : Generalized Anxiety Score 11/05/2015  Nervous, Anxious, on Edge 2  Control/stop worrying 3  Worry too much - different things 3  Trouble relaxing 2  Restless 0  Easily annoyed or irritable 3  Afraid - awful might happen 3  Total GAD 7 Score 16

## 2015-11-07 LAB — HEMOGLOBINOPATHY EVALUATION
HCT: 37.7 % (ref 35.0–45.0)
HGB A2 QUANT: 2.4 % (ref 1.8–3.5)
Hemoglobin: 12.1 g/dL (ref 11.7–15.5)
Hgb A: 96.6 % (ref >96.0)
MCH: 26.7 pg — ABNORMAL LOW (ref 27.0–33.0)
MCV: 83 fL (ref 80.0–100.0)
RBC: 4.54 MIL/uL (ref 3.80–5.10)
RDW: 22.3 % — ABNORMAL HIGH (ref 11.0–15.0)

## 2015-11-12 LAB — AFP, QUAD SCREEN
AFP: 24.6 ng/mL
Curr Gest Age: 16.4 weeks
Down Syndrome Scr Risk Est: 1:32300 {titer}
HCG, Total: 17.15 IU/mL
INH: 93.1 pg/mL
Interpretation-AFP: NEGATIVE
MOM FOR HCG: 0.61
MoM for AFP: 0.89
MoM for INH: 0.71
Open Spina bifida: NEGATIVE
Tri 18 Scr Risk Est: NEGATIVE
Trisomy 18 (Edward) Syndrome Interp.: 1:43400 {titer}
UE3 MOM: 1.28
UE3 VALUE: 1.03 ng/mL

## 2015-11-13 ENCOUNTER — Encounter (HOSPITAL_COMMUNITY): Payer: Self-pay | Admitting: *Deleted

## 2015-11-13 ENCOUNTER — Encounter (HOSPITAL_COMMUNITY): Payer: Self-pay | Admitting: Certified Nurse Midwife

## 2015-11-13 ENCOUNTER — Inpatient Hospital Stay (HOSPITAL_COMMUNITY)
Admission: AD | Admit: 2015-11-13 | Discharge: 2015-11-13 | Disposition: A | Discharge status: Home / Self Care | Payer: Medicaid Other | Source: Ambulatory Visit | Attending: Family Medicine | Admitting: Family Medicine

## 2015-11-13 DIAGNOSIS — R102 Pelvic and perineal pain: Secondary | ICD-10-CM | POA: Diagnosis present

## 2015-11-13 DIAGNOSIS — O99212 Obesity complicating pregnancy, second trimester: Secondary | ICD-10-CM | POA: Insufficient documentation

## 2015-11-13 DIAGNOSIS — Z3482 Encounter for supervision of other normal pregnancy, second trimester: Secondary | ICD-10-CM

## 2015-11-13 DIAGNOSIS — F419 Anxiety disorder, unspecified: Secondary | ICD-10-CM | POA: Diagnosis not present

## 2015-11-13 DIAGNOSIS — O2692 Pregnancy related conditions, unspecified, second trimester: Secondary | ICD-10-CM | POA: Diagnosis present

## 2015-11-13 DIAGNOSIS — Z87891 Personal history of nicotine dependence: Secondary | ICD-10-CM | POA: Insufficient documentation

## 2015-11-13 DIAGNOSIS — O99342 Other mental disorders complicating pregnancy, second trimester: Secondary | ICD-10-CM | POA: Diagnosis not present

## 2015-11-13 DIAGNOSIS — N949 Unspecified condition associated with female genital organs and menstrual cycle: Secondary | ICD-10-CM

## 2015-11-13 DIAGNOSIS — O9989 Other specified diseases and conditions complicating pregnancy, childbirth and the puerperium: Secondary | ICD-10-CM

## 2015-11-13 DIAGNOSIS — O99012 Anemia complicating pregnancy, second trimester: Secondary | ICD-10-CM | POA: Insufficient documentation

## 2015-11-13 DIAGNOSIS — Z3A17 17 weeks gestation of pregnancy: Secondary | ICD-10-CM | POA: Diagnosis not present

## 2015-11-13 DIAGNOSIS — O99019 Anemia complicating pregnancy, unspecified trimester: Secondary | ICD-10-CM

## 2015-11-13 LAB — URINALYSIS, ROUTINE W REFLEX MICROSCOPIC
Bilirubin Urine: NEGATIVE
GLUCOSE, UA: NEGATIVE mg/dL
HGB URINE DIPSTICK: NEGATIVE
KETONES UR: 15 mg/dL — AB
Leukocytes, UA: NEGATIVE
Nitrite: NEGATIVE
PROTEIN: NEGATIVE mg/dL
Specific Gravity, Urine: 1.025 (ref 1.005–1.030)
pH: 5.5 (ref 5.0–8.0)

## 2015-11-13 LAB — WET PREP, GENITAL
CLUE CELLS WET PREP: NONE SEEN
Sperm: NONE SEEN
Trich, Wet Prep: NONE SEEN
Yeast Wet Prep HPF POC: NONE SEEN

## 2015-11-13 MED ORDER — IBUPROFEN 800 MG PO TABS
800.0000 mg | ORAL_TABLET | Freq: Once | ORAL | Status: AC
  Administered 2015-11-13: 800 mg via ORAL
  Filled 2015-11-13: qty 1

## 2015-11-13 NOTE — MAU Note (Signed)
Pt c/o back pain on left side-has been a problem for 2 weeks, but tonight it got much worse and radiated to the left side of abdomen. Rates 7/10. Denies vag bleeding or discharge. Is not having any n/v/d or constipation. Does c/o pain with urination and pressure x2 weeks.

## 2015-11-13 NOTE — MAU Note (Signed)
PT SAYS  SHE  WENT  TO   CLINIC   ON 7-12-   AND  TOLD  OF  BACK PAIN ON LEFT   SIDE .    TOLD  TO WATCH  IT.       THEN TODAY   WENT  TO WORK AT 4 PM-   SHE GOT  SHARP PAIN   ON LEFT SIDE  AND  BACK   AND  STILL HAS  SAME  PAIN  NOW.        DID  NOT  TAKE  ANY MED    FOR  PAIN .     PAIN  MAKES  HER  SHIVER   AND  LIGHT  HEADED     AND  MAKES   BACK   HURT.       LAST SEX-     2 WEEKS  AGO.

## 2015-11-13 NOTE — MAU Provider Note (Signed)
History     CSN: [id number]  Arrival date and time: 11/13/15 2102   First Provider Initiated Contact with Patient 11/13/15 2237      Chief Complaint  Patient presents with  . Pelvic Pain   Pelvic Pain The patient's primary symptoms include pelvic pain. This is a new problem. The current episode started 1 to 4 weeks ago. The problem occurs constantly. The problem has been unchanged. Pain severity now: 8/10  The problem affects the left (Patient states that the pain starts in her back and goes "all the way down the front". She states that she also feels it from the near the bellybutton down into the pelvis. ) side. She is pregnant. Associated symptoms include abdominal pain and nausea. Pertinent negatives include no chills, constipation, diarrhea, dysuria, fever, frequency, urgency or vomiting. The vaginal discharge was normal. There has been no bleeding. Nothing aggravates the symptoms. She has tried nothing for the symptoms.    Past Medical History  Diagnosis Date  . Migraine   . Anemia   . Ovarian cyst     Past Surgical History  Procedure Laterality Date  . Oophorectomy Right     Family History  Problem Relation Age of Onset  . Hypertension Maternal Grandmother   . Cancer Maternal Grandfather     pancreatic    Social History  Substance Use Topics  . Smoking status: Former Smoker -- 0.50 packs/day    Types: Cigarettes, Cigars  . Smokeless tobacco: Former NeurosurgeonUser     Comment: quit with preg  . Alcohol Use: No    Allergies: No Known Allergies  Prescriptions prior to admission  Medication Sig Dispense Refill Last Dose  . acetaminophen (TYLENOL) 325 MG tablet Take 325 mg by mouth every 6 (six) hours as needed for moderate pain.   Past Week at Unknown time  . ferrous sulfate (FERROUSUL) 325 (65 FE) MG tablet Take 1 tablet (325 mg total) by mouth 2 (two) times daily with a meal. (Patient taking differently: Take 325 mg by mouth daily with breakfast. ) 60 tablet 1 11/13/2015  at Unknown time  . Prenat w/o A Vit-FeFum-FePo-FA (CONCEPT OB) 130-92.4-1 MG CAPS Take 1 tablet by mouth daily. 30 capsule 12 11/13/2015 at Unknown time  . azithromycin (ZITHROMAX) 250 MG tablet Take 4 tablets (1,000 mg total) by mouth once. (Patient not taking: Reported on 11/05/2015) 4 tablet 0 Not Taking  . butalbital-acetaminophen-caffeine (FIORICET) 50-325-40 MG tablet Take 1-2 tablets by mouth every 6 (six) hours as needed for headache. (Patient not taking: Reported on 11/13/2015) 20 tablet 0     Review of Systems  Constitutional: Negative for fever and chills.  Gastrointestinal: Positive for nausea and abdominal pain. Negative for vomiting, diarrhea and constipation.  Genitourinary: Positive for pelvic pain. Negative for dysuria, urgency and frequency.  Neurological: Positive for dizziness.   Physical Exam   Blood pressure 121/41, pulse 102, temperature 98.1 F (36.7 C), temperature source Oral, resp. rate 18, height 5\' 4"  (1.626 m), weight 262 lb 12 oz (119.183 kg), last menstrual period 07/13/2015.  Physical Exam  Nursing note and vitals reviewed. Constitutional: She is oriented to person, place, and time. She appears well-developed and well-nourished. No distress.  HENT:  Head: Normocephalic.  Cardiovascular: Normal rate.   Respiratory: Effort normal.  GI: Soft. There is no tenderness. There is no rebound.  Genitourinary:   External: no lesion Vagina: small amount of white discharge Cervix: pink, smooth, no CMT, closed/thick  Uterus: AGA, FHT 159 with doppler  Neurological: She is alert and oriented to person, place, and time.  Skin: Skin is warm and dry.  Psychiatric: She has a normal mood and affect.   Results for orders placed or performed during the hospital encounter of 11/13/15 (from the past 24 hour(s))  Urinalysis, Routine w reflex microscopic (not at [facility])     Status: Abnormal   Collection Time: 11/13/15 10:03 PM  Result Value Ref Range   Color, Urine YELLOW  YELLOW   APPearance CLEAR CLEAR   Specific Gravity, Urine 1.025 1.005 - 1.030   pH 5.5 5.0 - 8.0   Glucose, UA NEGATIVE NEGATIVE mg/dL   Hgb urine dipstick NEGATIVE NEGATIVE   Bilirubin Urine NEGATIVE NEGATIVE   Ketones, ur 15 (A) NEGATIVE mg/dL   Protein, ur NEGATIVE NEGATIVE mg/dL   Nitrite NEGATIVE NEGATIVE   Leukocytes, UA NEGATIVE NEGATIVE  Wet prep, genital     Status: Abnormal   Collection Time: 11/13/15 10:42 PM  Result Value Ref Range   Yeast Wet Prep HPF POC NONE SEEN NONE SEEN   Trich, Wet Prep NONE SEEN NONE SEEN   Clue Cells Wet Prep HPF POC NONE SEEN NONE SEEN   WBC, Wet Prep HPF POC FEW (A) NONE SEEN   Sperm NONE SEEN     MAU Course  Procedures  MDM Patient has had ibuprofen. She reports that her pain is better.   Assessment and Plan   1. Anxiety in pregnancy, antepartum, second trimester   2. Anemia affecting pregnancy, antepartum   3. Supervision of normal pregnancy, antepartum, second trimester   4. Obesity affecting pregnancy, antepartum, second trimester   5. Round ligament pain   6. [redacted] weeks gestation of pregnancy    DC home Comfort measures reviewed  2nd Trimester precautions  RX: none  Return to MAU as needed FU with OB as planned  Follow-up Information    [facility] with [facility]   Specialty:  Obstetrics and Gynecology   Why:  As scheduled   Contact information:   [address] [place] [address] [phone]        Tawnya Crook 11/13/2015, 10:38 PM

## 2015-11-13 NOTE — Discharge Instructions (Signed)

## 2015-11-14 LAB — GC/CHLAMYDIA PROBE AMP (~~LOC~~) NOT AT ARMC
CHLAMYDIA, DNA PROBE: NEGATIVE
Neisseria Gonorrhea: NEGATIVE

## 2015-11-20 ENCOUNTER — Other Ambulatory Visit: Payer: Self-pay | Admitting: Certified Nurse Midwife

## 2015-11-20 ENCOUNTER — Ambulatory Visit (HOSPITAL_COMMUNITY)
Admission: RE | Admit: 2015-11-20 | Discharge: 2015-11-20 | Disposition: A | Discharge status: Home / Self Care | Payer: Medicaid Other | Source: Ambulatory Visit | Attending: Certified Nurse Midwife | Admitting: Certified Nurse Midwife

## 2015-11-20 DIAGNOSIS — O99212 Obesity complicating pregnancy, second trimester: Secondary | ICD-10-CM | POA: Diagnosis not present

## 2015-11-20 DIAGNOSIS — Z3A19 19 weeks gestation of pregnancy: Secondary | ICD-10-CM | POA: Insufficient documentation

## 2015-11-20 DIAGNOSIS — Z36 Encounter for antenatal screening of mother: Secondary | ICD-10-CM | POA: Diagnosis not present

## 2015-11-20 DIAGNOSIS — Z1389 Encounter for screening for other disorder: Secondary | ICD-10-CM

## 2015-11-20 DIAGNOSIS — Z3482 Encounter for supervision of other normal pregnancy, second trimester: Secondary | ICD-10-CM

## 2015-11-20 DIAGNOSIS — Z3490 Encounter for supervision of normal pregnancy, unspecified, unspecified trimester: Secondary | ICD-10-CM

## 2015-11-20 DIAGNOSIS — Z3A18 18 weeks gestation of pregnancy: Secondary | ICD-10-CM

## 2015-11-21 ENCOUNTER — Encounter (HOSPITAL_COMMUNITY): Payer: Self-pay

## 2015-11-25 ENCOUNTER — Other Ambulatory Visit: Payer: Self-pay | Admitting: General Practice

## 2015-11-25 DIAGNOSIS — R51 Headache: Principal | ICD-10-CM

## 2015-11-25 DIAGNOSIS — R519 Headache, unspecified: Secondary | ICD-10-CM

## 2015-11-25 MED ORDER — BUTALBITAL-APAP-CAFFEINE 50-325-40 MG PO TABS: 1.0000 | ORAL_TABLET | Freq: Four times a day (QID) | ORAL | 0 refills | Status: DC | PRN

## 2015-11-29 ENCOUNTER — Encounter (HOSPITAL_COMMUNITY): Payer: Self-pay

## 2015-11-29 ENCOUNTER — Emergency Department (HOSPITAL_COMMUNITY)
Admission: EM | Admit: 2015-11-29 | Discharge: 2015-11-29 | Disposition: A | Discharge status: Home / Self Care | Payer: Medicaid Other | Attending: Emergency Medicine | Admitting: Emergency Medicine

## 2015-11-29 ENCOUNTER — Emergency Department (HOSPITAL_COMMUNITY): Payer: Medicaid Other

## 2015-11-29 DIAGNOSIS — M546 Pain in thoracic spine: Secondary | ICD-10-CM

## 2015-11-29 DIAGNOSIS — Z87891 Personal history of nicotine dependence: Secondary | ICD-10-CM | POA: Insufficient documentation

## 2015-11-29 DIAGNOSIS — Y92410 Unspecified street and highway as the place of occurrence of the external cause: Secondary | ICD-10-CM | POA: Diagnosis not present

## 2015-11-29 DIAGNOSIS — Y999 Unspecified external cause status: Secondary | ICD-10-CM | POA: Diagnosis not present

## 2015-11-29 DIAGNOSIS — R51 Headache: Secondary | ICD-10-CM | POA: Insufficient documentation

## 2015-11-29 DIAGNOSIS — Z3A19 19 weeks gestation of pregnancy: Secondary | ICD-10-CM | POA: Insufficient documentation

## 2015-11-29 DIAGNOSIS — M542 Cervicalgia: Secondary | ICD-10-CM | POA: Diagnosis not present

## 2015-11-29 DIAGNOSIS — O9A212 Injury, poisoning and certain other consequences of external causes complicating pregnancy, second trimester: Secondary | ICD-10-CM | POA: Diagnosis not present

## 2015-11-29 DIAGNOSIS — Y939 Activity, unspecified: Secondary | ICD-10-CM | POA: Insufficient documentation

## 2015-11-29 DIAGNOSIS — Z3490 Encounter for supervision of normal pregnancy, unspecified, unspecified trimester: Secondary | ICD-10-CM

## 2015-11-29 MED ORDER — ACETAMINOPHEN 325 MG PO TABS
650.0000 mg | ORAL_TABLET | Freq: Once | ORAL | Status: AC
  Administered 2015-11-29: 650 mg via ORAL
  Filled 2015-11-29: qty 2

## 2015-11-29 NOTE — Discharge Instructions (Signed)
Read the information below.   Imaging of your head and neck was normal. We were able to document fetal heart tones. You did not have any vaginal bleeding.  You can take tylenol for pain relief. You can apply ice to affected areas for 20 minute increments for the next 24-48 hours. You can also take warm showers.  It is very important that you follow up ASAP with your OBGYN, I recommend calling on Monday to schedule an appointment.  You may return to the Emergency Department at any time for worsening condition or any new symptoms that concern you. Return to ED if you develop worsening symptoms, lethargy, vomiting, changes in vision, numbness/weakness.  Go to [facility] if you develop severe lower abdominal cramping, vaginal bleeding, or have a gush of fluid.

## 2015-11-29 NOTE — ED Notes (Signed)
Pt back from ct placed on heart monitor 

## 2015-11-29 NOTE — ED Notes (Signed)
Pt ambulated to bathroom without assistance 

## 2015-11-29 NOTE — ED Notes (Addendum)
MD at bedside. 
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
## 2015-12-01 ENCOUNTER — Encounter: Payer: Self-pay | Admitting: Obstetrics & Gynecology
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
## 2015-12-04 ENCOUNTER — Encounter: Payer: Self-pay | Admitting: Obstetrics & Gynecology

## 2015-12-04 ENCOUNTER — Encounter: Payer: Self-pay | Admitting: Family

## 2015-12-04 ENCOUNTER — Ambulatory Visit (INDEPENDENT_AMBULATORY_CARE_PROVIDER_SITE_OTHER): Payer: Medicaid Other | Admitting: Obstetrics & Gynecology

## 2015-12-04 VITALS — BP 117/47 | HR 105 | Wt 265.5 lb

## 2015-12-04 DIAGNOSIS — M5441 Lumbago with sciatica, right side: Secondary | ICD-10-CM | POA: Diagnosis present

## 2015-12-04 LAB — POCT URINALYSIS DIP (DEVICE)
BILIRUBIN URINE: NEGATIVE
GLUCOSE, UA: NEGATIVE mg/dL
Hgb urine dipstick: NEGATIVE
Ketones, ur: NEGATIVE mg/dL
LEUKOCYTES UA: NEGATIVE
Nitrite: NEGATIVE
Protein, ur: NEGATIVE mg/dL
SPECIFIC GRAVITY, URINE: 1.02 (ref 1.005–1.030)
UROBILINOGEN UA: 0.2 mg/dL (ref 0.0–1.0)
pH: 5.5 (ref 5.0–8.0)
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
## 2015-12-08 ENCOUNTER — Ambulatory Visit: Payer: Self-pay

## 2015-12-10 ENCOUNTER — Telehealth: Payer: Self-pay | Admitting: Clinical

## 2015-12-10 NOTE — Telephone Encounter (Signed)
[facility]: Pt says she is [facility] to [facility] about her next medical appointment for "sometime in September", and would [facility]. Ms. Melvyn NethLewis is told that [facility] next medical appointment, and she is agreeable to that plan.

## 2015-12-11 ENCOUNTER — Encounter: Payer: Self-pay | Admitting: Family Medicine

## 2015-12-12 ENCOUNTER — Ambulatory Visit (INDEPENDENT_AMBULATORY_CARE_PROVIDER_SITE_OTHER): Payer: Medicaid Other | Admitting: Family Medicine

## 2015-12-12 ENCOUNTER — Encounter: Payer: Self-pay | Admitting: Family Medicine

## 2015-12-12 VITALS — BP 112/60 | HR 95 | Ht 64.0 in | Wt 265.0 lb

## 2015-12-12 DIAGNOSIS — S76011A Strain of muscle, fascia and tendon of right hip, initial encounter: Secondary | ICD-10-CM
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
## 2016-01-06 ENCOUNTER — Ambulatory Visit (INDEPENDENT_AMBULATORY_CARE_PROVIDER_SITE_OTHER): Payer: Medicaid Other | Admitting: Clinical

## 2016-01-06 ENCOUNTER — Ambulatory Visit (INDEPENDENT_AMBULATORY_CARE_PROVIDER_SITE_OTHER): Payer: Medicaid Other | Admitting: Advanced Practice Midwife

## 2016-01-06 VITALS — BP 118/86 | HR 96

## 2016-01-06 DIAGNOSIS — F4323 Adjustment disorder with mixed anxiety and depressed mood: Secondary | ICD-10-CM | POA: Diagnosis not present

## 2016-01-06 DIAGNOSIS — M25559 Pain in unspecified hip: Secondary | ICD-10-CM

## 2016-01-06 DIAGNOSIS — O9989 Other specified diseases and conditions complicating pregnancy, childbirth and the puerperium: Secondary | ICD-10-CM

## 2016-01-06 DIAGNOSIS — Z3492 Encounter for supervision of normal pregnancy, unspecified, second trimester: Secondary | ICD-10-CM

## 2016-01-06 DIAGNOSIS — Z3482 Encounter for supervision of other normal pregnancy, second trimester: Secondary | ICD-10-CM

## 2016-01-06 DIAGNOSIS — O26899 Other specified pregnancy related conditions, unspecified trimester: Secondary | ICD-10-CM

## 2016-01-06 LAB — POCT URINALYSIS DIP (DEVICE)
BILIRUBIN URINE: NEGATIVE
Glucose, UA: NEGATIVE mg/dL
HGB URINE DIPSTICK: NEGATIVE
KETONES UR: 15 mg/dL — AB
Leukocytes, UA: NEGATIVE
Nitrite: NEGATIVE
PH: 6.5 (ref 5.0–8.0)
Protein, ur: NEGATIVE mg/dL
Specific Gravity, Urine: 1.02 (ref 1.005–1.030)
Urobilinogen, UA: 0.2 mg/dL (ref 0.0–1.0)

## 2016-01-06 NOTE — Patient Instructions (Signed)
Glucose Tolerance Test The glucose tolerance test (GTT) is one of several tests used to diagnose diabetes mellitus. The GTT is a blood test, and it may include a urine test as well. The GTT checks to see how your body processes sugar (glucose). For this test, you will consume a drink containing a high level of glucose. Your blood glucose levels will be checked before you consume the drink and then again 1, 2, 3, and possibly 4 hours after you consume it. Your health care provider may recommend that you have the GTT if you:  Have a family history of diabetes.   Are very overweight (obese).   Have experienced infections that keep coming back.   Have had numerous cuts or wounds that did not heal quickly, especially on your legs and feet.   Are a woman and have a history of giving birth to very large babies or a history of repeated fetal loss (stillbirth).  Have had glucose in your urine or high blood sugar:   During pregnancy.   After a heart attack, surgery, or prolonged periods of high stress.  The GTT lasts 3-4 hours. Other than the glucose solution, you will not be allowed to eat or drink anything during the test. You must remain at the testing location to make sure that your blood and urine samples are taken on time. PREPARATION FOR TEST Eat normally for 3 days prior to the GTT test, including having plenty of carbohydrate-rich foods. Do not eat or drink anything except water during the final 12 hours before the test. You should not smoke or exercise during the test. In addition, your health care provider may ask you to stop taking certain medicines before the test. RESULTS It is your responsibility to obtain your test results. Ask the lab or department performing the test when and how you will get your results. Contact your health care provider to discuss any questions you have about your results. Range of Normal Values Ranges for normal values may vary among different labs and  hospitals. You should always check with your health care provider after having lab work or other tests done to discuss whether your values are considered within normal limits.  Normal levels of blood glucose are as follows:  Fasting: less than 110 mg/dL or less than 6.1 mmol/L (SI units).  1 hour after consuming the glucose drink: less than 200 mg/dL or less than 11.1 mmol/L.  2 hours after consuming the glucose drink: less than 140 mg/dL or less than 7.8 mmol/L.  3 hours after consuming the glucose drink: 70-115 mg/dL or less than 6.4 mmol/L.  4 hours after consuming the glucose drink: 70-115 mg/dL or less than 6.4 mmol/L. The normal result for the urine test is negative, meaning that glucose is absent from your urine. Some substances can interfere with GTT results. These may include:  Blood pressure and heart failure medicines, including beta blockers, furosemide, and thiazides.   Anti-inflammatory medicines, including aspirin.   Nicotine.   Some psychiatric medicines.   Oral contraceptives.   Diuretics or corticosteroids. Meaning of Results Outside Normal Value Ranges GTT test results that are above normal values may indicate health problems, such as:  Diabetes mellitus.   Acute stress response.   Cushing syndrome.   Tumors such as pheochromocytoma or glucagonoma.   Chronic renal failure.   Pancreatitis.   Hyperthyroidism.   Current infection.  Discuss your test results with your health care provider. He or she will use the results   to make a diagnosis and determine a treatment plan that is right for you.   This information is not intended to replace advice given to you by your health care provider. Make sure you discuss any questions you have with your health care provider.   Document Released: 05/05/2004 Document Revised: 05/03/2014 Document Reviewed: 08/17/2013 Elsevier Interactive Patient Education 2016 [employer] Inc[employer]  

## 2016-01-06 NOTE — Progress Notes (Signed)
   PRENATAL VISIT NOTE  Subjective:  Cynthia Horne is a 25 y.o. G1P0 at 3567w6d being seen today for ongoing prenatal care.  She is currently monitored for the following issues for this low-risk pregnancy and has Chlamydia infection affecting pregnancy in first trimester, antepartum; Anemia affecting pregnancy, antepartum; Supervision of normal pregnancy, antepartum; Obesity affecting pregnancy, antepartum; Migraine headache with aura; Anxiety in pregnancy, antepartum; and Peripartum hip pain on her problem list.  Patient reports no fetal movement all day.  Contractions: Not present.  .  Movement: (!) Decreased. Denies leaking of fluid.   The following portions of the patient's history were reviewed and updated as appropriate: allergies, current medications, past family history, past medical history, past social history, past surgical history and problem list. Problem list updated.  Objective:   Vitals:   01/06/16 1358  BP: 118/86  Pulse: 96    Fetal Status: Fetal Heart Rate (bpm): 145 Fundal Height: 25 cm Movement: (!) Decreased     General:  Alert, oriented and cooperative. Patient is in no acute distress.  Skin: Skin is warm and dry. No rash noted.   Cardiovascular: Normal heart rate noted  Respiratory: Normal respiratory effort, no problems with respiration noted  Abdomen: Soft, gravid, appropriate for gestational age. Pain/Pressure: Present     Pelvic:  Cervical exam deferred        Extremities: Normal range of motion.  Edema: Trace  Mental Status: Normal mood and affect. Normal behavior. Normal judgment and thought content.   Urinalysis: Urine Protein: Negative Urine Glucose: Negative  Assessment and Plan:  Pregnancy: G1P0 at 6567w6d  1. Peripartum hip pain     Scheduled for PT  2. Supervision of normal pregnancy in second trimester      FHR 145 per doppler,      Consulted Dr Shawnie PonsPratt who recommends NST - Fetal nonstress test       Reassuring with good audible FM (pt feels it  now) and 10x10 accels  Preterm labor symptoms and general obstetric precautions including but not limited to vaginal bleeding, contractions, leaking of fluid and fetal movement were reviewed in detail with the patient. Please refer to After Visit Summary for other counseling recommendations.  Return in about 2 weeks (around 01/20/2016) for [facility].  Aviva SignsMarie L Estill Llerena, CNM

## 2016-01-06 NOTE — Progress Notes (Signed)
  ASSESSMENT: Pt currently experiencing Adjustment disorder with mixed anxious and depressed mood. Pt needs to f/u with OB. Pt would benefit from continued brief therapeutic interventions regarding coping with symptoms of anxiety and depression . Stage of Change: Contemplative  PLAN: 1. F/U with behavioral health clinician as needed 2. Psychiatric Medications: none 3. Behavioral recommendations:   -Continue to practice daily CALM relaxation breathing technique prior to work and sleep daily -Consider adding progressive muscle relaxation to daily breathing exercises -Continue to talk to grandmother daily  SUBJECTIVE: Pt. f/u Pt. reports the following symptoms/concerns: Pt states that she has been doing daily relaxation breathing exercises, and that it is helping her cope with anxiety; work schedule and not feeling baby move much today is causing her greatest concern today. Duration of problem: About three months Severity: mild(depressive), moderate(anxiety)   OBJECTIVE: Orientation & Cognition: Oriented x3. Thought processes normal and appropriate to situation. Mood: appropriate Affect: appropriate Appearance: appropriate Risk of harm to self or others: no known risk of harm to self or others Substance use: none Assessments administered: PHQ9: 10/ GAD7: 12  Diagnosis: Adjustment disorder with mixed anxious and depressed mood CPT Code: F43.23  -------------------------------------------- Other(s) present in the room: none  Time spent with patient in exam room: 20 minutes, 2:30-2:50pm  Depression screen [facility] 2/9 01/06/2016 12/12/2015 11/05/2015  Decreased Interest 2 0 1  Down, Depressed, Hopeless 1 0 2  PHQ - 2 Score 3 0 3  Altered sleeping 3 - 3  Tired, decreased energy 3 - 3  Change in appetite 1 - 2  Feeling bad or failure about yourself  - - 1  Trouble concentrating 0 - 0  Moving slowly or fidgety/restless 0 - 0  Suicidal thoughts 0 - 0  PHQ-9 Score 10 - 12   GAD 7 :  Generalized Anxiety Score 01/06/2016 11/05/2015  Nervous, Anxious, on Edge 2 2  Control/stop worrying 2 3  Worry too much - different things 2 3  Trouble relaxing 1 2  Restless 0 0  Easily annoyed or irritable 2 3  Afraid - awful might happen 3 3  Total GAD 7 Score 12 16

## 2016-01-18 ENCOUNTER — Inpatient Hospital Stay (HOSPITAL_COMMUNITY)
Admission: AD | Admit: 2016-01-18 | Discharge: 2016-01-18 | Disposition: A | Discharge status: Home / Self Care | Payer: Medicaid Other | Source: Ambulatory Visit | Attending: Obstetrics & Gynecology | Admitting: Obstetrics & Gynecology

## 2016-01-18 ENCOUNTER — Encounter (HOSPITAL_COMMUNITY): Payer: Self-pay | Admitting: *Deleted

## 2016-01-18 DIAGNOSIS — O99512 Diseases of the respiratory system complicating pregnancy, second trimester: Secondary | ICD-10-CM | POA: Diagnosis not present

## 2016-01-18 DIAGNOSIS — Z87891 Personal history of nicotine dependence: Secondary | ICD-10-CM | POA: Insufficient documentation

## 2016-01-18 DIAGNOSIS — J069 Acute upper respiratory infection, unspecified: Secondary | ICD-10-CM | POA: Diagnosis not present

## 2016-01-18 DIAGNOSIS — Z3A27 27 weeks gestation of pregnancy: Secondary | ICD-10-CM | POA: Insufficient documentation

## 2016-01-18 DIAGNOSIS — O99019 Anemia complicating pregnancy, unspecified trimester: Secondary | ICD-10-CM

## 2016-01-18 DIAGNOSIS — R1030 Lower abdominal pain, unspecified: Secondary | ICD-10-CM | POA: Diagnosis present

## 2016-01-18 LAB — URINALYSIS, ROUTINE W REFLEX MICROSCOPIC
Bilirubin Urine: NEGATIVE
GLUCOSE, UA: NEGATIVE mg/dL
HGB URINE DIPSTICK: NEGATIVE
Ketones, ur: NEGATIVE mg/dL
NITRITE: NEGATIVE
PH: 5.5 (ref 5.0–8.0)
Protein, ur: NEGATIVE mg/dL
Specific Gravity, Urine: 1.025 (ref 1.005–1.030)

## 2016-01-18 LAB — URINE MICROSCOPIC-ADD ON

## 2016-01-18 NOTE — MAU Provider Note (Signed)
History     CSN: [id number]  Arrival date and time: 01/18/16 2217   None     No chief complaint on file.  Cynthia Horne is a 25 y.o. G1P0 at 5911w4d who presents with lower abdominal pain, body aches and L earache. States started to feel tired on Friday with nasal congestion, sinus pressure, L earache and some body aches. Has not tried anything OTC for her symptoms. Also has lower abdominal pain that feels dull, not cramping. Denies contractions. Has felt nauseous over the last 3 days and has had decreased water intake. Denies LOF, vaginal bleeding. Endorses good fetal movement.    OB History    Gravida Para Term Preterm AB Living   1             SAB TAB Ectopic Multiple Live Births                  Past Medical History:  Diagnosis Date  . Anemia   . Migraine   . Ovarian cyst     Past Surgical History:  Procedure Laterality Date  . OOPHORECTOMY Right     Family History  Problem Relation Age of Onset  . Hypertension Maternal Grandmother   . Cancer Maternal Grandfather     pancreatic    Social History  Substance Use Topics  . Smoking status: Former Smoker    Packs/day: 0.50    Types: Cigarettes, Cigars  . Smokeless tobacco: Former NeurosurgeonUser     Comment: quit with preg  . Alcohol use No    Allergies: No Known Allergies  Prescriptions Prior to Admission  Medication Sig Dispense Refill Last Dose  . acetaminophen (TYLENOL) 325 MG tablet Take 325 mg by mouth every 6 (six) hours as needed for moderate pain.   Not Taking  . butalbital-acetaminophen-caffeine (FIORICET) 50-325-40 MG tablet Take 1-2 tablets by mouth every 6 (six) hours as needed for headache. (Patient not taking: Reported on 12/12/2015) 20 tablet 0 Not Taking  . ferrous sulfate (FERROUSUL) 325 (65 FE) MG tablet Take 1 tablet (325 mg total) by mouth 2 (two) times daily with a meal. (Patient taking differently: Take 325 mg by mouth daily with breakfast. ) 60 tablet 1 Taking  . Prenat w/o A Vit-FeFum-FePo-FA  (CONCEPT OB) 130-92.4-1 MG CAPS Take 1 tablet by mouth daily. 30 capsule 12 Taking    Review of Systems  Constitutional: Negative for chills and fever.  HENT: Positive for congestion, ear pain (L greater than R) and sore throat. Negative for ear discharge.   Eyes: Negative for blurred vision, double vision and photophobia.  Respiratory: Negative for shortness of breath and wheezing.   Cardiovascular: Negative for chest pain.  Gastrointestinal: Positive for abdominal pain (central lower) and nausea. Negative for constipation, diarrhea, heartburn and vomiting.  Genitourinary: Negative for dysuria, flank pain, frequency, hematuria and urgency.  Musculoskeletal: Positive for myalgias.  Neurological: Negative for headaches.   Physical Exam   Blood pressure 126/55, pulse 97, temperature 98.7 F (37.1 C), temperature source Oral, resp. rate 18, height 5\' 4"  (1.626 m), weight 123.8 kg (273 lb), last menstrual period 07/13/2015, SpO2 100 %.  Physical Exam  Constitutional: She is oriented to person, place, and time. She appears well-developed and well-nourished. No distress.  HENT:  Head: Normocephalic and atraumatic.  Right Ear: External ear normal.  Left Ear: External ear normal.  Nose: Nose normal.  Mouth/Throat: Oropharynx is clear and moist. No oropharyngeal exudate.  Eyes: Conjunctivae and EOM are normal.  Neck: Normal range of motion.  Cardiovascular: Normal rate, regular rhythm and normal heart sounds.   No murmur heard. Respiratory: Effort normal and breath sounds normal. No respiratory distress. She has no wheezes.  GI: Soft. Bowel sounds are normal. There is no tenderness. There is no rebound and no guarding.  Lymphadenopathy:    She has no cervical adenopathy.  Neurological: She is alert and oriented to person, place, and time.  Skin: Skin is dry.  Psychiatric: She has a normal mood and affect.    MAU Course  Procedures  MDM Urinalysis neg for nitrites  Assessment and  Plan  IUP@27 .4 Viral upper respiratory infection - encouraged good po hydration, gave list of OTC medication safe in pregnancy - given return precautions and labor precautions.  Leland Her 01/18/2016, 11:31 PM

## 2016-01-18 NOTE — Discharge Instructions (Signed)

## 2016-01-18 NOTE — MAU Note (Signed)
Pt reports body aches, earache, pressure in her lower abd and back pain. Symptoms started on Friday.

## 2016-01-23 ENCOUNTER — Ambulatory Visit (INDEPENDENT_AMBULATORY_CARE_PROVIDER_SITE_OTHER): Payer: Medicaid Other | Admitting: Family Medicine

## 2016-01-23 VITALS — BP 119/64 | HR 113 | Wt 272.0 lb

## 2016-01-23 DIAGNOSIS — Z3482 Encounter for supervision of other normal pregnancy, second trimester: Secondary | ICD-10-CM | POA: Diagnosis present

## 2016-01-23 DIAGNOSIS — Z23 Encounter for immunization: Secondary | ICD-10-CM

## 2016-01-23 LAB — CBC
HEMATOCRIT: 35.6 % (ref 35.0–45.0)
Hemoglobin: 11.9 g/dL (ref 11.7–15.5)
MCH: 29.5 pg (ref 27.0–33.0)
MCHC: 33.4 g/dL (ref 32.0–36.0)
MCV: 88.3 fL (ref 80.0–100.0)
MPV: 11 fL (ref 7.5–12.5)
Platelets: 181 10*3/uL (ref 140–400)
RBC: 4.03 MIL/uL (ref 3.80–5.10)
RDW: 15.7 % — AB (ref 11.0–15.0)
WBC: 8.8 10*3/uL (ref 3.8–10.8)

## 2016-01-23 MED ORDER — TETANUS-DIPHTH-ACELL PERTUSSIS 5-2.5-18.5 LF-MCG/0.5 IM SUSP
0.5000 mL | Freq: Once | INTRAMUSCULAR | Status: AC
  Administered 2016-01-23: 0.5 mL via INTRAMUSCULAR

## 2016-01-23 NOTE — Progress Notes (Signed)
Declines flu, would like tdap.

## 2016-01-23 NOTE — Progress Notes (Signed)
   PRENATAL VISIT NOTE  Subjective:  Cynthia Horne is a 25 y.o. G1P0 at 5170w2d being seen today for ongoing prenatal care.  She is currently monitored for the following issues for this low-risk pregnancy and has Chlamydia infection affecting pregnancy in first trimester, antepartum; Anemia affecting pregnancy, antepartum; Supervision of normal pregnancy, antepartum; Obesity affecting pregnancy, antepartum; Migraine headache with aura; Anxiety in pregnancy, antepartum; and Peripartum hip pain on her problem list.  Patient reports no complaints.  Contractions: Not present. Vag. Bleeding: None.  Movement: Present. Denies leaking of fluid.   The following portions of the patient's history were reviewed and updated as appropriate: allergies, current medications, past family history, past medical history, past social history, past surgical history and problem list. Problem list updated.  Objective:   Vitals:   01/23/16 0940  BP: 119/64  Pulse: (!) 113  Weight: 272 lb (123.4 kg)    Fetal Status: Fetal Heart Rate (bpm): 140 Fundal Height: 28 cm Movement: Present     General:  Alert, oriented and cooperative. Patient is in no acute distress.  Skin: Skin is warm and dry. No rash noted.   Cardiovascular: Normal heart rate noted  Respiratory: Normal respiratory effort, no problems with respiration noted  Abdomen: Soft, gravid, appropriate for gestational age. Pain/Pressure: Present     Pelvic:  Cervical exam deferred        Extremities: Normal range of motion.  Edema: Trace  Mental Status: Normal mood and affect. Normal behavior. Normal judgment and thought content.   Urinalysis:      Assessment and Plan:  Pregnancy: G1P0 at 3870w2d  1. Supervision of normal pregnancy, antepartum, second trimester FHT and FH normal - Glucose Tolerance, 1 HR (50g) - CBC - RPR - HIV antibody - Tdap (BOOSTRIX) injection 0.5 mL; Inject 0.5 mLs into the muscle once. - Hemoglobinopathy Evaluation  Preterm  labor symptoms and general obstetric precautions including but not limited to vaginal bleeding, contractions, leaking of fluid and fetal movement were reviewed in detail with the patient. Please refer to After Visit Summary for other counseling recommendations.  No Follow-up on file.  Levie HeritageJacob J Wanna Gully, DO

## 2016-01-24 LAB — GLUCOSE TOLERANCE, 1 HOUR (50G) W/O FASTING: Glucose, 1 Hr, gestational: 117 mg/dL (ref <140)

## 2016-01-24 LAB — HIV ANTIBODY (ROUTINE TESTING W REFLEX): HIV: NONREACTIVE

## 2016-01-24 LAB — RPR

## 2016-01-27 LAB — HEMOGLOBINOPATHY EVALUATION
HEMATOCRIT: 35.6 % (ref 35.0–45.0)
HEMOGLOBIN: 11.9 g/dL (ref 11.7–15.5)
Hgb A2 Quant: 2.3 % (ref 1.8–3.5)
Hgb A: 96.7 % (ref >96.0)
Hgb F Quant: <1 % (ref <2.0)
MCH: 29.5 pg (ref 27.0–33.0)
MCV: 88.3 fL (ref 80.0–100.0)
RBC: 4.03 MIL/uL (ref 3.80–5.10)
RDW: 15.7 % — ABNORMAL HIGH (ref 11.0–15.0)

## 2016-02-09 ENCOUNTER — Encounter: Payer: Self-pay | Admitting: Obstetrics & Gynecology

## 2016-02-09 ENCOUNTER — Ambulatory Visit (INDEPENDENT_AMBULATORY_CARE_PROVIDER_SITE_OTHER): Payer: Medicaid Other | Admitting: Clinical

## 2016-02-09 ENCOUNTER — Ambulatory Visit (INDEPENDENT_AMBULATORY_CARE_PROVIDER_SITE_OTHER): Payer: Medicaid Other | Admitting: Obstetrics & Gynecology

## 2016-02-09 VITALS — BP 119/62 | HR 109 | Wt 273.6 lb

## 2016-02-09 DIAGNOSIS — Z638 Other specified problems related to primary support group: Secondary | ICD-10-CM | POA: Diagnosis not present

## 2016-02-09 DIAGNOSIS — Z3403 Encounter for supervision of normal first pregnancy, third trimester: Secondary | ICD-10-CM

## 2016-02-09 NOTE — Patient Instructions (Signed)
Return to clinic for any scheduled appointments or obstetric concerns, or go to MAU for evaluation  

## 2016-02-09 NOTE — Progress Notes (Signed)
   PRENATAL VISIT NOTE  Subjective:  Cynthia Horne is a 25 y.o. G1P0 at 282w5d being seen today for ongoing prenatal care.  She is currently monitored for the following issues for this low-risk pregnancy and has Chlamydia infection affecting pregnancy in first trimester, antepartum; Anemia affecting pregnancy, antepartum; Supervision of normal pregnancy, antepartum; Obesity affecting pregnancy, antepartum; Migraine headache with aura; Anxiety in pregnancy, antepartum; and Peripartum hip pain on her problem list.  Patient reports no complaints.  Contractions: Not present. Vag. Bleeding: None.  Movement: Present. Denies leaking of fluid.   The following portions of the patient's history were reviewed and updated as appropriate: allergies, current medications, past family history, past medical history, past social history, past surgical history and problem list. Problem list updated.  Objective:   Vitals:   02/09/16 1159  BP: 119/62  Pulse: (!) 109  Weight: 273 lb 9.6 oz (124.1 kg)    Fetal Status: Fetal Heart Rate (bpm): 140 Fundal Height: 31 cm Movement: Present     General:  Alert, oriented and cooperative. Patient is in no acute distress.  Skin: Skin is warm and dry. No rash noted.   Cardiovascular: Normal heart rate noted  Respiratory: Normal respiratory effort, no problems with respiration noted  Abdomen: Soft, gravid, appropriate for gestational age. Pain/Pressure: Absent     Pelvic:  Cervical exam deferred        Extremities: Normal range of motion.  Edema: Trace  Mental Status: Normal mood and affect. Normal behavior. Normal judgment and thought content.   Assessment and Plan:  Pregnancy: G1P0 at 772w5d  1. Encounter for supervision of normal first pregnancy in third trimester Preterm labor symptoms and general obstetric precautions including but not limited to vaginal bleeding, contractions, leaking of fluid and fetal movement were reviewed in detail with the patient. Please  refer to After Visit Summary for other counseling recommendations.  Return in about 2 weeks (around 02/23/2016) for OB Visit.  Tereso NewcomerUgonna A Anyanwu, MD

## 2016-02-09 NOTE — BH Specialist Note (Signed)
Session Start time: 12:10   End Time: 12:50 Total Time:  40 minutes Type of Service: Behavioral Health - Individual/Family Interpreter: No.   Interpreter Name & Language: n/a # [facility]   SUBJECTIVE: Cynthia Horne is a 25 y.o. female  Pt. was f/u for:  anxiety. Pt. reports the following symptoms/concerns: Pt states that she is overwhelmed by relationship with mother and sister; primary concern is feeling overly emotional and a need to feel loved by her own mother, prior to birth. Pt also wants to find stable, peaceful housing for herself, unborn daughter, and boyfriend. Duration of problem:  undetermined Severity: severe Previous treatment: none   OBJECTIVE: Mood: Anxious & Affect: Tearful Risk of harm to self or others: no known risk of harm to self or others Assessments administered: Inappropriate today  LIFE CONTEXT:  Family & Social: Lives with mother and sister; FOB and his family, as well as pt's maternal grandmother are biggest supports  Product/process development scientistchool/ Work: Works (too upset to go in today) Self-Care: Undetermined today  Life changes: Current pregnancy (bringing up maternal attachment issues with own mother) What is important to pt/family (values): Feeling loved by her mother   GOALS ADDRESSED:  -Reach a level of reduced tension, increased satisfaction, and improved communication with family -Begin the process of emancipating from parent in a healthy way by making arrangements for independent living  INTERVENTIONS: Strength-based and Supportive   ASSESSMENT:  Pt currently experiencing Relationship problem with family member.  Pt may benefit from psychoeducation and brief therapeutic intervention regarding coping with stress of relationship problem.      PLAN: 1. F/U with behavioral health clinician: as needed 2. Behavioral Health meds: none 3. Behavioral recommendations:  -Dance movement psychotherapistbtain [facility] application [facility], [facility]  [facility] today 02-09-16 -[place] -Stay with boyfriend's family today and tonight -Reconsider family therapy, as needed, prior to upcoming birth -Continue daily relaxation breathing, as needed -Consider calming apps as a self-help support 4. Referral: Brief Counseling/Psychotherapy, Publishing rights managerCommunity Resource and Psychoeducation 5. From scale of 1-10, how likely are you to follow plan: uncertain   Gaynell FaceJamie C Kalonji Zurawski LCSWA Behavioral Health Clinician  Warmhandoff:   Warm Hand Off Completed.        Depression screen [facility] 2/9 01/23/2016 01/06/2016 12/12/2015 11/05/2015  Decreased Interest 2 2 0 1  Down, Depressed, Hopeless 0 1 0 2  PHQ - 2 Score 2 3 0 3  Altered sleeping 1 3 - 3  Tired, decreased energy 2 3 - 3  Change in appetite 0 1 - 2  Feeling bad or failure about yourself  0 - - 1  Trouble concentrating 0 0 - 0  Moving slowly or fidgety/restless 0 0 - 0  Suicidal thoughts 0 0 - 0  PHQ-9 Score 5 10 - 12   GAD 7 : Generalized Anxiety Score 01/23/2016 01/06/2016 11/05/2015  Nervous, Anxious, on Edge 1 2 2   Control/stop worrying 1 2 3   Worry too much - different things 1 2 3   Trouble relaxing 1 1 2   Restless 1 0 0  Easily annoyed or irritable 1 2 3   Afraid - awful might happen 1 3 3   Total GAD 7 Score 7 12 16

## 2016-02-10 ENCOUNTER — Inpatient Hospital Stay (HOSPITAL_COMMUNITY)
Admission: AD | Admit: 2016-02-10 | Discharge: 2016-02-10 | Disposition: A | Discharge status: Home / Self Care | Payer: Medicaid Other | Source: Ambulatory Visit | Attending: Obstetrics & Gynecology | Admitting: Obstetrics & Gynecology

## 2016-02-10 ENCOUNTER — Encounter (HOSPITAL_COMMUNITY): Payer: Self-pay | Admitting: *Deleted

## 2016-02-10 DIAGNOSIS — O368130 Decreased fetal movements, third trimester, not applicable or unspecified: Secondary | ICD-10-CM | POA: Diagnosis present

## 2016-02-10 DIAGNOSIS — O99340 Other mental disorders complicating pregnancy, unspecified trimester: Secondary | ICD-10-CM

## 2016-02-10 DIAGNOSIS — F419 Anxiety disorder, unspecified: Secondary | ICD-10-CM | POA: Diagnosis not present

## 2016-02-10 DIAGNOSIS — O368131 Decreased fetal movements, third trimester, fetus 1: Secondary | ICD-10-CM

## 2016-02-10 DIAGNOSIS — O99019 Anemia complicating pregnancy, unspecified trimester: Secondary | ICD-10-CM

## 2016-02-10 DIAGNOSIS — Z3A30 30 weeks gestation of pregnancy: Secondary | ICD-10-CM | POA: Insufficient documentation

## 2016-02-10 DIAGNOSIS — O99210 Obesity complicating pregnancy, unspecified trimester: Secondary | ICD-10-CM

## 2016-02-10 DIAGNOSIS — O99343 Other mental disorders complicating pregnancy, third trimester: Secondary | ICD-10-CM

## 2016-02-10 DIAGNOSIS — Z87891 Personal history of nicotine dependence: Secondary | ICD-10-CM | POA: Insufficient documentation

## 2016-02-10 DIAGNOSIS — Z3400 Encounter for supervision of normal first pregnancy, unspecified trimester: Secondary | ICD-10-CM

## 2016-02-10 NOTE — MAU Provider Note (Signed)
  MAU HISTORY AND PHYSICAL  Chief Complaint:  Decreased Fetal Movement   Cynthia Horne is a 25 y.o.  G1P0  at 5047w6d presenting for Decreased Fetal Movement . Patient states she has been having  Braxton-Hicks contractions, none vaginal bleeding, intact membranes, with decreased  fetal movement.    Has not felt the baby move since 7pm last night. She keeps track of kick counts on her phone. Has had uncomplicated pregnancy to date.  Past Medical History:  Diagnosis Date  . Anemia   . Migraine   . Ovarian cyst     Past Surgical History:  Procedure Laterality Date  . OOPHORECTOMY Right     Family History  Problem Relation Age of Onset  . Hypertension Maternal Grandmother   . Cancer Maternal Grandfather     pancreatic    Social History  Substance Use Topics  . Smoking status: Former Smoker    Packs/day: 0.50    Types: Cigarettes, Cigars  . Smokeless tobacco: Former NeurosurgeonUser     Comment: quit with preg  . Alcohol use No    No Known Allergies  Prescriptions Prior to Admission  Medication Sig Dispense Refill Last Dose  . acetaminophen (TYLENOL) 325 MG tablet Take 325 mg by mouth every 6 (six) hours as needed for moderate pain.   Taking  . butalbital-acetaminophen-caffeine (FIORICET) 50-325-40 MG tablet Take 1-2 tablets by mouth every 6 (six) hours as needed for headache. (Patient not taking: Reported on 02/09/2016) 20 tablet 0 Not Taking  . ferrous sulfate (FERROUSUL) 325 (65 FE) MG tablet Take 1 tablet (325 mg total) by mouth 2 (two) times daily with a meal. (Patient taking differently: Take 325 mg by mouth daily with breakfast. ) 60 tablet 1 Taking  . Prenat w/o A Vit-FeFum-FePo-FA (CONCEPT OB) 130-92.4-1 MG CAPS Take 1 tablet by mouth daily. 30 capsule 12 Taking  . pseudoephedrine (SUDAFED) 30 MG tablet Take 30 mg by mouth every 4 (four) hours as needed for congestion.   Not Taking    Review of Systems - Negative except for what is mentioned in HPI.  Physical Exam  Pulse  114, temperature 98 F (36.7 C), temperature source Oral, resp. rate 18, last menstrual period 07/13/2015. GENERAL: Well-developed, well-nourished female in no acute distress.  LUNGS: No respiratory distress HEART: Regular rate ABDOMEN: Soft, nontender, nondistended, gravid abdomen EXTREMITIES: Nontender, no edema, 2+ distal pulses. FHT:  Baseline 150, moderate variability, accelerations present, no decelerations Contractions: none   Labs: No results found for this or any previous visit (from the past 24 hour(s)).  Imaging Studies:  No results found.  Assessment: Cynthia Horne is  25 y.o. G1P0 at 747w6d presents with Decreased Fetal Movement . MDM FHT Category 1   Plan: Concern for decreased fetal movement- FHT reactive Reassurance provided [facility]   Tillman SersAngela C Riccio, DO PGY-1 10/17/20179:56 PM  OB FELLOW DISCHARGE ATTESTATION  I have seen and examined this patient and agree with above documentation in the resident's note.   Ernestina PennaNicholas Schenk, MD 10:08 PM

## 2016-02-10 NOTE — MAU Note (Signed)
Pt presents complaining of decreased fetal movement since last night at 1900. States she has felt no movement. Denies pain. Denies bleeding or leaking or abnormal discharge.

## 2016-02-10 NOTE — Discharge Instructions (Signed)
Braxton Hicks Contractions °Contractions of the uterus can occur throughout pregnancy. Contractions are not always a sign that you are in labor.  °WHAT ARE BRAXTON HICKS CONTRACTIONS?  °Contractions that occur before labor are called Braxton Hicks contractions, or false labor. Toward the end of pregnancy (32-34 weeks), these contractions can develop more often and may become more forceful. This is not true labor because these contractions do not result in opening (dilatation) and thinning of the cervix. They are sometimes difficult to tell apart from true labor because these contractions can be forceful and people have different pain tolerances. You should not feel embarrassed if you go to the hospital with false labor. Sometimes, the only way to tell if you are in true labor is for your health care provider to look for changes in the cervix. °If there are no prenatal problems or other health problems associated with the pregnancy, it is completely safe to be sent home with false labor and await the onset of true labor. °HOW CAN YOU TELL THE DIFFERENCE BETWEEN TRUE AND FALSE LABOR? °False Labor °· The contractions of false labor are usually shorter and not as hard as those of true labor.   °· The contractions are usually irregular.   °· The contractions are often felt in the front of the lower abdomen and in the groin.   °· The contractions may go away when you walk around or change positions while lying down.   °· The contractions get weaker and are shorter lasting as time goes on.   °· The contractions do not usually become progressively stronger, regular, and closer together as with true labor.   °True Labor °· Contractions in true labor last 30-70 seconds, become very regular, usually become more intense, and increase in frequency.   °· The contractions do not go away with walking.   °· The discomfort is usually felt in the top of the uterus and spreads to the lower abdomen and low back.   °· True labor can be  determined by your health care provider with an exam. This will show that the cervix is dilating and getting thinner.   °WHAT TO REMEMBER °· Keep up with your usual exercises and follow other instructions given by your health care provider.   °· Take medicines as directed by your health care provider.   °· Keep your regular prenatal appointments.   °· Eat and drink lightly if you think you are going into labor.   °· If Braxton Hicks contractions are making you uncomfortable:   °¨ Change your position from lying down or resting to walking, or from walking to resting.   °¨ Sit and rest in a tub of warm water.   °¨ Drink 2-3 glasses of water. Dehydration may cause these contractions.   °¨ Do slow and deep breathing several times an hour.   °WHEN SHOULD I SEEK IMMEDIATE MEDICAL CARE? °Seek immediate medical care if: °· Your contractions become stronger, more regular, and closer together.   °· You have fluid leaking or gushing from your vagina.   °· You have a fever.   °· You pass blood-tinged mucus.   °· You have vaginal bleeding.   °· You have continuous abdominal pain.   °· You have low back pain that you never had before.   °· You feel your baby's head pushing down and causing pelvic pressure.   °· Your baby is not moving as much as it used to.   °  °This information is not intended to replace advice given to you by your health care provider. Make sure you discuss any questions you have with your health care   provider. °  °Document Released: 04/12/2005 Document Revised: 04/17/2013 Document Reviewed: 01/22/2013 °Elsevier Interactive Patient Education ©2016 [employer] ° °

## 2016-02-24 ENCOUNTER — Other Ambulatory Visit (HOSPITAL_COMMUNITY)
Admission: RE | Admit: 2016-02-24 | Discharge: 2016-02-24 | Disposition: A | Discharge status: Home / Self Care | Payer: Medicaid Other | Source: Ambulatory Visit | Attending: Obstetrics and Gynecology | Admitting: Obstetrics and Gynecology

## 2016-02-24 ENCOUNTER — Encounter: Payer: Self-pay | Admitting: Obstetrics and Gynecology

## 2016-02-24 ENCOUNTER — Ambulatory Visit (INDEPENDENT_AMBULATORY_CARE_PROVIDER_SITE_OTHER): Payer: Medicaid Other | Admitting: Advanced Practice Midwife

## 2016-02-24 VITALS — BP 114/54 | HR 121 | Wt 275.0 lb

## 2016-02-24 DIAGNOSIS — Z113 Encounter for screening for infections with a predominantly sexual mode of transmission: Secondary | ICD-10-CM

## 2016-02-24 DIAGNOSIS — N949 Unspecified condition associated with female genital organs and menstrual cycle: Secondary | ICD-10-CM

## 2016-02-24 DIAGNOSIS — A749 Chlamydial infection, unspecified: Secondary | ICD-10-CM

## 2016-02-24 DIAGNOSIS — O98311 Other infections with a predominantly sexual mode of transmission complicating pregnancy, first trimester: Secondary | ICD-10-CM

## 2016-02-24 DIAGNOSIS — R350 Frequency of micturition: Secondary | ICD-10-CM

## 2016-02-24 DIAGNOSIS — R102 Pelvic and perineal pain: Secondary | ICD-10-CM

## 2016-02-24 DIAGNOSIS — Z3400 Encounter for supervision of normal first pregnancy, unspecified trimester: Secondary | ICD-10-CM

## 2016-02-24 DIAGNOSIS — O98811 Other maternal infectious and parasitic diseases complicating pregnancy, first trimester: Secondary | ICD-10-CM

## 2016-02-24 DIAGNOSIS — O26893 Other specified pregnancy related conditions, third trimester: Secondary | ICD-10-CM

## 2016-02-24 DIAGNOSIS — E669 Obesity, unspecified: Secondary | ICD-10-CM

## 2016-02-24 DIAGNOSIS — O99210 Obesity complicating pregnancy, unspecified trimester: Secondary | ICD-10-CM

## 2016-02-24 LAB — POCT URINALYSIS DIP (DEVICE)
BILIRUBIN URINE: NEGATIVE
Glucose, UA: NEGATIVE mg/dL
HGB URINE DIPSTICK: NEGATIVE
Ketones, ur: NEGATIVE mg/dL
Leukocytes, UA: NEGATIVE
NITRITE: NEGATIVE
PH: 7 (ref 5.0–8.0)
Protein, ur: NEGATIVE mg/dL
Specific Gravity, Urine: 1.015 (ref 1.005–1.030)
Urobilinogen, UA: 0.2 mg/dL (ref 0.0–1.0)

## 2016-02-24 NOTE — Patient Instructions (Signed)
Your baby was head down on exam today. Your cervix was closed and long.   You can take Benadryl as needed to help w/ sleep.  Braxton Hicks Contractions Contractions of the uterus can occur throughout pregnancy. Contractions are not always a sign that you are in labor.  WHAT ARE BRAXTON HICKS CONTRACTIONS?  Contractions that occur before labor are called Braxton Hicks contractions, or false labor. Toward the end of pregnancy (32-34 weeks), these contractions can develop more often and may become more forceful. This is not true labor because these contractions do not result in opening (dilatation) and thinning of the cervix. They are sometimes difficult to tell apart from true labor because these contractions can be forceful and people have different pain tolerances. You should not feel embarrassed if you go to the hospital with false labor. Sometimes, the only way to tell if you are in true labor is for your health care provider to look for changes in the cervix. If there are no prenatal problems or other health problems associated with the pregnancy, it is completely safe to be sent home with false labor and await the onset of true labor. HOW CAN YOU TELL THE DIFFERENCE BETWEEN TRUE AND FALSE LABOR? False Labor  The contractions of false labor are usually shorter and not as hard as those of true labor.   The contractions are usually irregular.   The contractions are often felt in the front of the lower abdomen and in the groin.   The contractions may go away when you walk around or change positions while lying down.   The contractions get weaker and are shorter lasting as time goes on.   The contractions do not usually become progressively stronger, regular, and closer together as with true labor.  True Labor  Contractions in true labor last 30-70 seconds, become very regular, usually become more intense, and increase in frequency.   The contractions do not go away with walking.    The discomfort is usually felt in the top of the uterus and spreads to the lower abdomen and low back.   True labor can be determined by your health care provider with an exam. This will show that the cervix is dilating and getting thinner.  WHAT TO REMEMBER  Keep up with your usual exercises and follow other instructions given by your health care provider.   Take medicines as directed by your health care provider.   Keep your regular prenatal appointments.   Eat and drink lightly if you think you are going into labor.   If Braxton Hicks contractions are making you uncomfortable:   Change your position from lying down or resting to walking, or from walking to resting.   Sit and rest in a tub of warm water.   Drink 2-3 glasses of water. Dehydration may cause these contractions.   Do slow and deep breathing several times an hour.  WHEN SHOULD I SEEK IMMEDIATE MEDICAL CARE? Seek immediate medical care if:  Your contractions become stronger, more regular, and closer together.   You have fluid leaking or gushing from your vagina.   You have a fever.   You pass blood-tinged mucus.   You have vaginal bleeding.   You have continuous abdominal pain.   You have low back pain that you never had before.   You feel your baby's head pushing down and causing pelvic pressure.   Your baby is not moving as much as it used to.    This  information is not intended to replace advice given to you by your health care provider. Make sure you discuss any questions you have with your health care provider.   Document Released: 04/12/2005 Document Revised: 04/17/2013 Document Reviewed: 01/22/2013 Elsevier Interactive Patient Education [employer]2016 Elsevier Inc.

## 2016-02-24 NOTE — Progress Notes (Signed)
Patient reports constant vaginal pain/discomfort

## 2016-02-24 NOTE — Addendum Note (Signed)
Addended by: Dorathy KinsmanSMITH, Trayvon Trumbull on: 02/24/2016 10:51 AM   Modules accepted: Orders

## 2016-02-24 NOTE — Progress Notes (Signed)
   PRENATAL VISIT NOTE  Subjective:  Cynthia Horne is a 25 y.o. G1P0 at 725w6d being seen today for ongoing prenatal care.  She is currently monitored for the following issues for this low-risk pregnancy and has Chlamydia infection affecting pregnancy in first trimester, antepartum; Anemia affecting pregnancy, antepartum; Supervision of normal pregnancy, antepartum; Obesity affecting pregnancy, antepartum; Migraine headache with aura; Anxiety in pregnancy, antepartum; and Peripartum hip pain on her problem list.  Patient reports occasional contractions and pelvic pressure, ~3 contractions per hour and hip pain, L>R especially at night.  Contractions: Irritability. Vag. Bleeding: None.  Movement: Present. Denies leaking of fluid.   The following portions of the patient's history were reviewed and updated as appropriate: allergies, current medications, past family history, past medical history, past social history, past surgical history and problem list. Problem list updated.  Objective:   Vitals:   02/24/16 1009  BP: (!) 114/54  Pulse: (!) 121  Weight: 275 lb (124.7 kg)    Fetal Status: Fetal Heart Rate (bpm): 143 Fundal Height: 33 cm Movement: Present  Presentation: Vertex  General:  Alert, oriented and cooperative. Patient is in no acute distress.  Skin: Skin is warm and dry. No rash noted.   Cardiovascular: Normal heart rate noted  Respiratory: Normal respiratory effort, no problems with respiration noted  Abdomen: Soft, gravid, appropriate for gestational age. Pain/Pressure: Present     Pelvic:  Cervical exam performed Dilation: Closed Effacement (%): 0 Station: -3  Extremities: Normal range of motion.  Edema: Trace  Mental Status: Normal mood and affect. Normal behavior. Normal judgment and thought content.   UA negative  Assessment and Plan:  Pregnancy: G1P0 at 5525w6d  1. Supervision of normal first pregnancy, antepartum   2. Obesity affecting pregnancy, antepartum   3.  Chlamydia infection affecting pregnancy in first trimester, antepartum   4. Urinary frequency  - Culture, OB Urine  5. Pelvic pressure in pregnancy, antepartum, third trimester  - Culture, OB Urine - Wet prep, genital fFN collected but discarded due to normal exam.    Preterm labor symptoms and general obstetric precautions including but not limited to vaginal bleeding, contractions, leaking of fluid and fetal movement were reviewed in detail with the patient. Please refer to After Visit Summary for other counseling recommendations.  F/U 2 weeks.  Dorathy KinsmanVirginia Emigdio Wildeman, CNM

## 2016-02-25 ENCOUNTER — Other Ambulatory Visit: Payer: Self-pay | Admitting: Advanced Practice Midwife

## 2016-02-25 DIAGNOSIS — N760 Acute vaginitis: Principal | ICD-10-CM

## 2016-02-25 DIAGNOSIS — B9689 Other specified bacterial agents as the cause of diseases classified elsewhere: Secondary | ICD-10-CM

## 2016-02-25 DIAGNOSIS — B373 Candidiasis of vulva and vagina: Secondary | ICD-10-CM

## 2016-02-25 DIAGNOSIS — B3731 Acute candidiasis of vulva and vagina: Secondary | ICD-10-CM

## 2016-02-25 LAB — WET PREP, GENITAL: Trich, Wet Prep: NONE SEEN

## 2016-02-25 LAB — GC/CHLAMYDIA PROBE AMP (~~LOC~~) NOT AT ARMC
Chlamydia: NEGATIVE
Neisseria Gonorrhea: NEGATIVE

## 2016-02-25 LAB — CULTURE, OB URINE: ORGANISM ID, BACTERIA: NO GROWTH

## 2016-02-25 MED ORDER — TERCONAZOLE 0.4 % VA CREA: 1.0000 | TOPICAL_CREAM | Freq: Every day | VAGINAL | 0 refills | Status: DC

## 2016-02-25 MED ORDER — METRONIDAZOLE 500 MG PO TABS: 500.0000 mg | ORAL_TABLET | Freq: Two times a day (BID) | ORAL | 0 refills | Status: DC

## 2016-02-25 NOTE — Progress Notes (Signed)
Dx Yeast infection, BV. Rx Terazol, Flagyl.

## 2016-03-02 ENCOUNTER — Encounter (HOSPITAL_COMMUNITY): Payer: Self-pay

## 2016-03-02 ENCOUNTER — Inpatient Hospital Stay (HOSPITAL_COMMUNITY)
Admission: AD | Admit: 2016-03-02 | Discharge: 2016-03-02 | Disposition: A | Discharge status: Home / Self Care | Payer: Medicaid Other | Source: Ambulatory Visit | Attending: Family Medicine | Admitting: Family Medicine

## 2016-03-02 DIAGNOSIS — Z3A33 33 weeks gestation of pregnancy: Secondary | ICD-10-CM | POA: Insufficient documentation

## 2016-03-02 DIAGNOSIS — O26893 Other specified pregnancy related conditions, third trimester: Secondary | ICD-10-CM | POA: Diagnosis not present

## 2016-03-02 DIAGNOSIS — Z87891 Personal history of nicotine dependence: Secondary | ICD-10-CM | POA: Insufficient documentation

## 2016-03-02 DIAGNOSIS — N898 Other specified noninflammatory disorders of vagina: Secondary | ICD-10-CM | POA: Diagnosis not present

## 2016-03-02 LAB — URINALYSIS, ROUTINE W REFLEX MICROSCOPIC
Bilirubin Urine: NEGATIVE
GLUCOSE, UA: NEGATIVE mg/dL
Hgb urine dipstick: NEGATIVE
LEUKOCYTES UA: NEGATIVE
Nitrite: NEGATIVE
PROTEIN: NEGATIVE mg/dL
Specific Gravity, Urine: 1.025 (ref 1.005–1.030)
pH: 5.5 (ref 5.0–8.0)

## 2016-03-02 LAB — POCT PREGNANCY, URINE: PREG TEST UR: POSITIVE — AB

## 2016-03-02 LAB — WET PREP, GENITAL
Clue Cells Wet Prep HPF POC: NONE SEEN
Sperm: NONE SEEN
TRICH WET PREP: NONE SEEN
Yeast Wet Prep HPF POC: NONE SEEN

## 2016-03-02 LAB — POCT FERN TEST: POCT Fern Test: NEGATIVE

## 2016-03-02 NOTE — MAU Note (Signed)
Urine sent to lab 

## 2016-03-02 NOTE — MAU Note (Signed)
Pt presents to MAU for ctx. Ctx started at 1200 and were every 10 min. Pt used the bathroom around 1700 and had some clear fluid come out and when she wiped she has mucous like discharge. Pt has not had any ctx since being here.

## 2016-03-02 NOTE — MAU Provider Note (Signed)
History     CSN: [id number]  Arrival date and time: 03/02/16 1816   First Provider Initiated Contact with Patient 03/02/16 1915      Chief Complaint  Patient presents with  . Vaginal Discharge   HPI Cynthia Horne is a 25 y.o. G1P0 at 3528w6d who presents with abdominal pain & vaginal discharge. Reports contractions like pain every 10 minutes since 12 noon today. Report contractions have decreased in frequency since coming to MAU. Denies pain at this time.  While using the restroom this evening, saw clear watery discharge. No odor. Does not think she has continued to leak. Denies recent intercourse or vaginal bleeding. Positive fetal movement.   OB History    Gravida Para Term Preterm AB Living   1         0   SAB TAB Ectopic Multiple Live Births                  Past Medical History:  Diagnosis Date  . Anemia   . Migraine   . Ovarian cyst     Past Surgical History:  Procedure Laterality Date  . OOPHORECTOMY Right     Family History  Problem Relation Age of Onset  . Hypertension Maternal Grandmother   . Cancer Maternal Grandfather     pancreatic    Social History  Substance Use Topics  . Smoking status: Former Smoker    Packs/day: 0.50    Types: Cigarettes, Cigars  . Smokeless tobacco: Former NeurosurgeonUser     Comment: quit with preg  . Alcohol use No    Allergies: No Known Allergies  Prescriptions Prior to Admission  Medication Sig Dispense Refill Last Dose  . acetaminophen (TYLENOL) 325 MG tablet Take 325 mg by mouth every 6 (six) hours as needed for moderate pain.   Not Taking  . ferrous sulfate (FERROUSUL) 325 (65 FE) MG tablet Take 1 tablet (325 mg total) by mouth 2 (two) times daily with a meal. (Patient taking differently: Take 325 mg by mouth daily with breakfast. ) 60 tablet 1 Taking  . metroNIDAZOLE (FLAGYL) 500 MG tablet Take 1 tablet (500 mg total) by mouth 2 (two) times daily. 14 tablet 0   . Prenat w/o A Vit-FeFum-FePo-FA (CONCEPT OB) 130-92.4-1 MG CAPS  Take 1 tablet by mouth daily. 30 capsule 12 Taking  . terconazole (TERAZOL 7) 0.4 % vaginal cream Place 1 applicator vaginally at bedtime. 45 g 0     Review of Systems  Constitutional: Negative.   Gastrointestinal: Positive for abdominal pain. Negative for constipation, diarrhea, nausea and vomiting.  Genitourinary: Negative for dysuria.       + vaginal discharge vs LOF No vaginal bleeding   Physical Exam   Blood pressure 103/69, pulse 109, temperature 97.7 F (36.5 C), temperature source Oral, resp. rate 18, last menstrual period 07/13/2015.  Physical Exam  Nursing note and vitals reviewed. Constitutional: She is oriented to person, place, and time. She appears well-developed and well-nourished. No distress.  HENT:  Head: Normocephalic and atraumatic.  Eyes: Conjunctivae are normal. Right eye exhibits no discharge. Left eye exhibits no discharge. No scleral icterus.  Neck: Normal range of motion.  Cardiovascular: Normal rate, regular rhythm and normal heart sounds.   No murmur heard. Respiratory: Effort normal and breath sounds normal. No respiratory distress. She has no wheezes.  GI: Soft. There is no tenderness.  Genitourinary: No bleeding in the vagina. Vaginal discharge (small amount of creamy white discharge) found.  Genitourinary Comments:  No pooling  Neurological: She is alert and oriented to person, place, and time.  Skin: Skin is warm and dry. She is not diaphoretic.  Psychiatric: She has a normal mood and affect. Her behavior is normal. Judgment and thought content normal.   Dilation: Closed Exam by:: Judeth HornErin Elyza Whitt NP  Fetal Tracing:  Baseline: 145 Variability: moderate Accelerations: 15x15 Decelerations: none  Toco: UI + irregular contractions   MAU Course  Procedures Results for orders placed or performed during the hospital encounter of 03/02/16 (from the past 24 hour(s))  Urinalysis, Routine w reflex microscopic ([facility])     Status: Abnormal    Collection Time: 03/02/16  6:35 PM  Result Value Ref Range   Color, Urine YELLOW YELLOW   APPearance CLEAR CLEAR   Specific Gravity, Urine 1.025 1.005 - 1.030   pH 5.5 5.0 - 8.0   Glucose, UA NEGATIVE NEGATIVE mg/dL   Hgb urine dipstick NEGATIVE NEGATIVE   Bilirubin Urine NEGATIVE NEGATIVE   Ketones, ur >80 (A) NEGATIVE mg/dL   Protein, ur NEGATIVE NEGATIVE mg/dL   Nitrite NEGATIVE NEGATIVE   Leukocytes, UA NEGATIVE NEGATIVE  Wet prep, genital     Status: Abnormal   Collection Time: 03/02/16  7:20 PM  Result Value Ref Range   Yeast Wet Prep HPF POC NONE SEEN NONE SEEN   Trich, Wet Prep NONE SEEN NONE SEEN   Clue Cells Wet Prep HPF POC NONE SEEN NONE SEEN   WBC, Wet Prep HPF POC MANY (A) NONE SEEN   Sperm NONE SEEN   POCT fern test     Status: None   Collection Time: 03/02/16  7:29 PM  Result Value Ref Range   POCT Fern Test Negative = intact amniotic membranes   Pregnancy, urine POC     Status: Abnormal   Collection Time: 03/02/16  7:47 PM  Result Value Ref Range   Preg Test, Ur POSITIVE (A) NEGATIVE    MDM Category 1 tracing Cervix closed No pooling, fern negative Wet prep negative Ctx resolved with PO hydration  Assessment and Plan  A:  1. Vaginal discharge during pregnancy in third trimester    P: Discharge home Preterm labor precautions Discussed reasons to return to MAU Keep f/u with OB  Judeth HornErin Cynthia Horne 03/02/2016, 7:15 PM

## 2016-03-02 NOTE — Discharge Instructions (Signed)

## 2016-03-10 ENCOUNTER — Ambulatory Visit (INDEPENDENT_AMBULATORY_CARE_PROVIDER_SITE_OTHER): Payer: Medicaid Other | Admitting: Student

## 2016-03-10 VITALS — BP 113/61 | HR 110 | Wt 278.0 lb

## 2016-03-10 DIAGNOSIS — Z3400 Encounter for supervision of normal first pregnancy, unspecified trimester: Secondary | ICD-10-CM

## 2016-03-10 DIAGNOSIS — Z3403 Encounter for supervision of normal first pregnancy, third trimester: Secondary | ICD-10-CM

## 2016-03-10 NOTE — Progress Notes (Signed)
   PRENATAL VISIT NOTE  Subjective:  Cynthia Horne is a 25 y.o. G1P0 at 3759w0d being seen today for ongoing prenatal care.  She is currently monitored for the following issues for this low-risk pregnancy and has Chlamydia infection affecting pregnancy in first trimester, antepartum; Anemia affecting pregnancy, antepartum; Supervision of normal pregnancy, antepartum; Obesity affecting pregnancy, antepartum; Migraine headache with aura; Anxiety in pregnancy, antepartum; and Peripartum hip pain on her problem list.  Patient reports no complaints.  Contractions: Irritability. Vag. Bleeding: None.  Movement: Present. Denies leaking of fluid.   The following portions of the patient's history were reviewed and updated as appropriate: allergies, current medications, past family history, past medical history, past social history, past surgical history and problem list. Problem list updated.  Objective:   Vitals:   03/10/16 0924 03/10/16 1003  BP: 113/61   Pulse: (!) 130 (!) 110  Weight: 278 lb (126.1 kg)     Fetal Status: Fetal Heart Rate (bpm): 160 Fundal Height: 36 cm Movement: Present     General:  Alert, oriented and cooperative. Patient is in no acute distress.  Skin: Skin is warm and dry. No rash noted.   Cardiovascular: Normal heart rate noted  Respiratory: Normal respiratory effort, no problems with respiration noted  Abdomen: Soft, gravid, appropriate for gestational age. Pain/Pressure: Present     Pelvic:  Cervical exam deferred        Extremities: Normal range of motion.  Edema: Trace  Mental Status: Normal mood and affect. Normal behavior. Normal judgment and thought content.   Assessment and Plan:  Pregnancy: G1P0 at 2759w0d  1. Supervision of normal first pregnancy, antepartum  Patient's pulse upon arrival was 130, after sitting and resting her pulse was 111. O2 saturation was 100%. She describes finding it hard to breathe at times; her lungs were clear to auscultation and her RR  while sitting was 17.  Discussed with Dr. Debroah LoopArnold, who agrees patient is stable to leave the clinic and return for her return OB visit next week. GBS swab will be collected at next visit.   Term labor symptoms and general obstetric precautions including but not limited to vaginal bleeding, contractions, leaking of fluid and fetal movement were reviewed in detail with the patient. Please refer to After Visit Summary for other counseling recommendations.  No Follow-up on file.   Marylene LandKathryn Lorraine Andrei Mccook, CNM

## 2016-03-10 NOTE — Progress Notes (Signed)
0.Patient reports difficulty inhaling fully since returning to work

## 2016-03-10 NOTE — Patient Instructions (Signed)
. °Preterm Labor and Birth Information °The normal length of a pregnancy is 39-41 weeks. Preterm labor is when labor starts before 37 completed weeks of pregnancy. °What are the risk factors for preterm labor? °Preterm labor is more likely to occur in women who: °· Have certain infections during pregnancy such as a bladder infection, sexually transmitted infection, or infection inside the uterus (chorioamnionitis). °· Have a shorter-than-normal cervix. °· Have gone into preterm labor before. °· Have had surgery on their cervix. °· Are younger than age 17 or older than age 35. °· Are African American. °· Are pregnant with twins or multiple babies (multiple gestation). °· Take street drugs or smoke while pregnant. °· Do not gain enough weight while pregnant. °· Became pregnant shortly after having been pregnant. °What are the symptoms of preterm labor? °Symptoms of preterm labor include: °· Cramps similar to those that can happen during a menstrual period. The cramps may happen with diarrhea. °· Pain in the abdomen or lower back. °· Regular uterine contractions that may feel like tightening of the abdomen. °· A feeling of increased pressure in the pelvis. °· Increased watery or bloody mucus discharge from the vagina. °· Water breaking (ruptured amniotic sac). °Why is it important to recognize signs of preterm labor? °It is important to recognize signs of preterm labor because babies who are born prematurely may not be fully developed. This can put them at an increased risk for: °· Long-term (chronic) heart and lung problems. °· Difficulty immediately after birth with regulating body systems, including blood sugar, body temperature, heart rate, and breathing rate. °· Bleeding in the brain. °· Cerebral palsy. °· Learning difficulties. °· Death. °These risks are highest for babies who are born before 34 weeks of pregnancy. °How is preterm labor treated? °Treatment depends on the length of your pregnancy, your condition,  and the health of your baby. It may involve: °· Having a stitch (suture) placed in your cervix to prevent your cervix from opening too early (cerclage). °· Taking or being given medicines, such as: °¨ Hormone medicines. These may be given early in pregnancy to help support the pregnancy. °¨ Medicine to stop contractions. °¨ Medicines to help mature the baby’s lungs. These may be prescribed if the risk of delivery is high. °¨ Medicines to prevent your baby from developing cerebral palsy. °If the labor happens before 34 weeks of pregnancy, you may need to stay in the hospital. °What should I do if I think I am in preterm labor? °If you think that you are going into preterm labor, call your health care provider right away. °How can I prevent preterm labor in future pregnancies? °To increase your chance of having a full-term pregnancy: °· Do not use any tobacco products, such as cigarettes, chewing tobacco, and e-cigarettes. If you need help quitting, ask your health care provider. °· Do not use street drugs or medicines that have not been prescribed to you during your pregnancy. °· Talk with your health care provider before taking any herbal supplements, even if you have been taking them regularly. °· Make sure you gain a healthy amount of weight during your pregnancy. °· Watch for infection. If you think that you might have an infection, get it checked right away. °· Make sure to tell your health care provider if you have gone into preterm labor before. °This information is not intended to replace advice given to you by your health care provider. Make sure you discuss any questions you have with your   health care provider. °Document Released: 07/03/2003 Document Revised: 09/23/2015 Document Reviewed: 09/03/2015 °Elsevier Interactive Patient Education © 2017 Elsevier Inc. ° °

## 2016-03-22 ENCOUNTER — Ambulatory Visit (INDEPENDENT_AMBULATORY_CARE_PROVIDER_SITE_OTHER): Payer: Medicaid Other | Admitting: Certified Nurse Midwife

## 2016-03-22 VITALS — BP 120/75 | HR 116 | Wt 281.6 lb

## 2016-03-22 DIAGNOSIS — R609 Edema, unspecified: Secondary | ICD-10-CM

## 2016-03-22 DIAGNOSIS — Z3400 Encounter for supervision of normal first pregnancy, unspecified trimester: Secondary | ICD-10-CM

## 2016-03-22 DIAGNOSIS — O1203 Gestational edema, third trimester: Secondary | ICD-10-CM

## 2016-03-22 DIAGNOSIS — Z3403 Encounter for supervision of normal first pregnancy, third trimester: Secondary | ICD-10-CM

## 2016-03-22 LAB — OB RESULTS CONSOLE GBS: GBS: POSITIVE

## 2016-03-22 MED ORDER — COMFORT FIT MATERNITY SUPP LG MISC: 1.0000 "application " | Freq: Every day | 0 refills | Status: DC

## 2016-03-22 NOTE — Progress Notes (Signed)
Subjective:  Loel RoUriel A Anna is a 25 y.o. G1P0 at 2256w5d being seen today for ongoing prenatal care.  She is currently monitored for the following issues for this low-risk pregnancy and has Chlamydia infection affecting pregnancy in first trimester, antepartum; Anemia affecting pregnancy, antepartum; Supervision of normal pregnancy, antepartum; Obesity affecting pregnancy, antepartum; Migraine headache with aura; Anxiety in pregnancy, antepartum; and Peripartum hip pain on her problem list.  Patient reports edema of hands and feet.  Contractions: Irritability. Vag. Bleeding: None.  Movement: Present. Denies leaking of fluid.   The following portions of the patient's history were reviewed and updated as appropriate: allergies, current medications, past family history, past medical history, past social history, past surgical history and problem list. Problem list updated.  Objective:   Vitals:   03/22/16 0812  BP: 120/75  Pulse: (!) 116  Weight: 281 lb 9.6 oz (127.7 kg)    Fetal Status: Fetal Heart Rate (bpm): 155 Fundal Height: 38 cm Movement: Present  Presentation: Vertex  General:  Alert, oriented and cooperative. Patient is in no acute distress.  Skin: Skin is warm and dry. No rash noted.   Cardiovascular: Normal heart rate noted  Respiratory: Normal respiratory effort, no problems with respiration noted  Abdomen: Soft, gravid, appropriate for gestational age. Pain/Pressure: Present     Pelvic: Vag. Bleeding: None     Cervical exam performed Dilation: 1.5 Effacement (%): 60 Station: -3  Extremities: Normal range of motion.  Edema: 1+ LE L>R  Mental Status: Normal mood and affect. Normal behavior. Normal judgment and thought content.   Urinalysis:      Assessment and Plan:  Pregnancy: G1P0 at 1256w5d  1. Supervision of normal first pregnancy, antepartum  Term labor symptoms and general obstetric precautions including but not limited to vaginal bleeding, contractions, leaking of fluid  and fetal movement were reviewed in detail with the patient. Please refer to After Visit Summary for other counseling recommendations.  Return in about 1 week (around 03/29/2016).   Donette LarryMelanie Arnita Koons, CNM

## 2016-03-23 LAB — CULTURE, BETA STREP (GROUP B ONLY)

## 2016-03-29 ENCOUNTER — Ambulatory Visit (INDEPENDENT_AMBULATORY_CARE_PROVIDER_SITE_OTHER): Payer: Medicaid Other | Admitting: Medical

## 2016-03-29 VITALS — BP 104/78 | HR 86 | Wt 284.4 lb

## 2016-03-29 DIAGNOSIS — A491 Streptococcal infection, unspecified site: Secondary | ICD-10-CM

## 2016-03-29 DIAGNOSIS — Z3403 Encounter for supervision of normal first pregnancy, third trimester: Secondary | ICD-10-CM

## 2016-03-29 DIAGNOSIS — Z3400 Encounter for supervision of normal first pregnancy, unspecified trimester: Secondary | ICD-10-CM

## 2016-03-29 NOTE — Progress Notes (Signed)
   PRENATAL VISIT NOTE  Subjective:  Cynthia Horne is a 25 y.o. G1P0 at 6841w5d being seen today for ongoing prenatal care.  She is currently monitored for the following issues for this low-risk pregnancy and has Chlamydia infection affecting pregnancy in first trimester, antepartum; Anemia affecting pregnancy, antepartum; Supervision of normal pregnancy, antepartum; Obesity affecting pregnancy, antepartum; Migraine headache with aura; Anxiety in pregnancy, antepartum; and Peripartum hip pain on her problem list.  Patient reports contractions since last night, q 10 minutes. Patient also states swelling of hands and feet, worse at night, improves with rest. Denies headache, visual changes. Normotensive today. Contractions: Irregular. Vag. Bleeding: None.  Movement: Present. Denies leaking of fluid.   The following portions of the patient's history were reviewed and updated as appropriate: allergies, current medications, past family history, past medical history, past social history, past surgical history and problem list. Problem list updated.  Objective:   Vitals:   03/29/16 0825  BP: 104/78  Pulse: 86  Weight: 284 lb 6.4 oz (129 kg)    Fetal Status: Fetal Heart Rate (bpm): 150 Fundal Height: 39 cm Movement: Present  Presentation: Vertex  General:  Alert, oriented and cooperative. Patient is in no acute distress.  Skin: Skin is warm and dry. No rash noted.   Cardiovascular: Normal heart rate noted  Respiratory: Normal respiratory effort, no problems with respiration noted  Abdomen: Soft, gravid, appropriate for gestational age. Pain/Pressure: Present     Pelvic:  Cervical exam performed Dilation: 1.5 Effacement (%): 60 Station: -3  Extremities: Normal range of motion.  Edema: Trace non-pitting, equal bilaterally  Mental Status: Normal mood and affect. Normal behavior. Normal judgment and thought content.   Assessment and Plan:  Pregnancy: G1P0 at 7641w5d  1. Supervision of normal first  pregnancy, antepartum  2. GBS (group B streptococcus) infection - Discussed positive results with patient, questions answered. NKDA  Term labor symptoms and general obstetric precautions including but not limited to vaginal bleeding, contractions, leaking of fluid and fetal movement were reviewed in detail with the patient. Please refer to After Visit Summary for other counseling recommendations.  Return in about 1 week (around 04/05/2016) for LROB.   Marny LowensteinJulie N Favor Hackler, PA-C

## 2016-03-29 NOTE — Patient Instructions (Signed)
Braxton Hicks Contractions Contractions of the uterus can occur throughout pregnancy. Contractions are not always a sign that you are in labor.  WHAT ARE BRAXTON HICKS CONTRACTIONS?  Contractions that occur before labor are called Braxton Hicks contractions, or false labor. Toward the end of pregnancy (32-34 weeks), these contractions can develop more often and may become more forceful. This is not true labor because these contractions do not result in opening (dilatation) and thinning of the cervix. They are sometimes difficult to tell apart from true labor because these contractions can be forceful and people have different pain tolerances. You should not feel embarrassed if you go to the hospital with false labor. Sometimes, the only way to tell if you are in true labor is for your health care provider to look for changes in the cervix. If there are no prenatal problems or other health problems associated with the pregnancy, it is completely safe to be sent home with false labor and await the onset of true labor. HOW CAN YOU TELL THE DIFFERENCE BETWEEN TRUE AND FALSE LABOR? False Labor   The contractions of false labor are usually shorter and not as hard as those of true labor.   The contractions are usually irregular.   The contractions are often felt in the front of the lower abdomen and in the groin.   The contractions may go away when you walk around or change positions while lying down.   The contractions get weaker and are shorter lasting as time goes on.   The contractions do not usually become progressively stronger, regular, and closer together as with true labor.  True Labor   Contractions in true labor last 30-70 seconds, become very regular, usually become more intense, and increase in frequency.   The contractions do not go away with walking.   The discomfort is usually felt in the top of the uterus and spreads to the lower abdomen and low back.   True labor can be  determined by your health care provider with an exam. This will show that the cervix is dilating and getting thinner.  WHAT TO REMEMBER  Keep up with your usual exercises and follow other instructions given by your health care provider.   Take medicines as directed by your health care provider.   Keep your regular prenatal appointments.   Eat and drink lightly if you think you are going into labor.   If Braxton Hicks contractions are making you uncomfortable:   Change your position from lying down or resting to walking, or from walking to resting.   Sit and rest in a tub of warm water.   Drink 2-3 glasses of water. Dehydration may cause these contractions.   Do slow and deep breathing several times an hour.  WHEN SHOULD I SEEK IMMEDIATE MEDICAL CARE? Seek immediate medical care if:  Your contractions become stronger, more regular, and closer together.   You have fluid leaking or gushing from your vagina.   You have a fever.   You pass blood-tinged mucus.   You have vaginal bleeding.   You have continuous abdominal pain.   You have low back pain that you never had before.   You feel your baby's head pushing down and causing pelvic pressure.   Your baby is not moving as much as it used to.  This information is not intended to replace advice given to you by your health care provider. Make sure you discuss any questions you have with your health care   provider. Document Released: 04/12/2005 Document Revised: 08/04/2015 Document Reviewed: 01/22/2013 Elsevier Interactive Patient Education  2017 [employer] Introduction Patient Name: ________________________________________________ Patient Due Date: ____________________ What is a fetal movement count? A fetal movement count is the number of times that you feel your baby move during a certain amount of time. This may also be called a fetal kick count. A fetal movement count is recommended for every pregnant  woman. You may be asked to start counting fetal movements as early as week 28 of your pregnancy. Pay attention to when your baby is most active. You may notice your baby's sleep and wake cycles. You may also notice things that make your baby move more. You should do a fetal movement count:  When your baby is normally most active.  At the same time each day. A good time to count movements is while you are resting, after having something to eat and drink. How do I count fetal movements? 1. Find a quiet, comfortable area. Sit, or lie down on your side. 2. Write down the date, the start time and stop time, and the number of movements that you felt between those two times. Take this information with you to your health care visits. 3. For 2 hours, count kicks, flutters, swishes, rolls, and jabs. You should feel at least 10 movements during 2 hours. 4. You may stop counting after you have felt 10 movements. 5. If you do not feel 10 movements in 2 hours, have something to eat and drink. Then, keep resting and counting for 1 hour. If you feel at least 4 movements during that hour, you may stop counting. Contact a health care provider if:  You feel fewer than 4 movements in 2 hours.  Your baby is not moving like he or she usually does. Date: ____________ Start time: ____________ Stop time: ____________ Movements: ____________ Date: ____________ Start time: ____________ Stop time: ____________ Movements: ____________ Date: ____________ Start time: ____________ Stop time: ____________ Movements: ____________ Date: ____________ Start time: ____________ Stop time: ____________ Movements: ____________ Date: ____________ Start time: ____________ Stop time: ____________ Movements: ____________ Date: ____________ Start time: ____________ Stop time: ____________ Movements: ____________ Date: ____________ Start time: ____________ Stop time: ____________ Movements: ____________ Date: ____________ Start time:  ____________ Stop time: ____________ Movements: ____________ Date: ____________ Start time: ____________ Stop time: ____________ Movements: ____________ This information is not intended to replace advice given to you by your health care provider. Make sure you discuss any questions you have with your health care provider. Document Released: 05/12/2006 Document Revised: 12/10/2015 Document Reviewed: 05/22/2015 Elsevier Interactive Patient Education  2017 [employer]  

## 2016-03-31 ENCOUNTER — Encounter (HOSPITAL_COMMUNITY): Payer: Self-pay

## 2016-03-31 ENCOUNTER — Inpatient Hospital Stay (EMERGENCY_DEPARTMENT_HOSPITAL)
Admission: AD | Admit: 2016-03-31 | Discharge: 2016-03-31 | Disposition: A | Discharge status: Home / Self Care | Payer: Medicaid Other | Source: Ambulatory Visit | Attending: Obstetrics and Gynecology | Admitting: Obstetrics and Gynecology

## 2016-03-31 DIAGNOSIS — O99019 Anemia complicating pregnancy, unspecified trimester: Secondary | ICD-10-CM

## 2016-03-31 DIAGNOSIS — O99340 Other mental disorders complicating pregnancy, unspecified trimester: Secondary | ICD-10-CM

## 2016-03-31 DIAGNOSIS — Z3493 Encounter for supervision of normal pregnancy, unspecified, third trimester: Secondary | ICD-10-CM | POA: Insufficient documentation

## 2016-03-31 DIAGNOSIS — O99210 Obesity complicating pregnancy, unspecified trimester: Secondary | ICD-10-CM

## 2016-03-31 DIAGNOSIS — O26893 Other specified pregnancy related conditions, third trimester: Secondary | ICD-10-CM | POA: Diagnosis not present

## 2016-03-31 DIAGNOSIS — N898 Other specified noninflammatory disorders of vagina: Secondary | ICD-10-CM | POA: Diagnosis not present

## 2016-03-31 DIAGNOSIS — Z3A38 38 weeks gestation of pregnancy: Secondary | ICD-10-CM | POA: Insufficient documentation

## 2016-03-31 DIAGNOSIS — F419 Anxiety disorder, unspecified: Secondary | ICD-10-CM

## 2016-03-31 LAB — POCT FERN TEST: POCT FERN TEST: NEGATIVE

## 2016-03-31 NOTE — MAU Note (Signed)
Pt reports leaking fluid since 1830, some contractions.

## 2016-03-31 NOTE — MAU Note (Signed)
Pt states that she was at work and felt like she was urinating on herself around 1815-1830. Has continued to feel leak. Pinkish in color. Denies contractions. Was 2.5cm on Monday. +FM.

## 2016-03-31 NOTE — Discharge Instructions (Signed)
Introduction Patient Name: ________________________________________________ Patient Due Date: ____________________   Cynthia PeltonBraxton Hicks Contractions Contractions of the uterus can occur throughout pregnancy. Contractions are not always a sign that you are in labor.  WHAT ARE BRAXTON HICKS CONTRACTIONS?  Contractions that occur before labor are called Braxton Hicks contractions, or false labor. Toward the end of pregnancy (32-34 weeks), these contractions can develop more often and may become more forceful. This is not true labor because these contractions do not result in opening (dilatation) and thinning of the cervix. They are sometimes difficult to tell apart from true labor because these contractions can be forceful and people have different pain tolerances. You should not feel embarrassed if you go to the hospital with false labor. Sometimes, the only way to tell if you are in true labor is for your health care provider to look for changes in the cervix. If there are no prenatal problems or other health problems associated with the pregnancy, it is completely safe to be sent home with false labor and await the onset of true labor. HOW CAN YOU TELL THE DIFFERENCE BETWEEN TRUE AND FALSE LABOR? False Labor   The contractions of false labor are usually shorter and not as hard as those of true labor.   The contractions are usually irregular.   The contractions are often felt in the front of the lower abdomen and in the groin.   The contractions may go away when you walk around or change positions while lying down.   The contractions get weaker and are shorter lasting as time goes on.   The contractions do not usually become progressively stronger, regular, and closer together as with true labor.  True Labor   Contractions in true labor last 30-70 seconds, become very regular, usually become more intense, and increase in frequency.   The contractions do not go away with walking.   The  discomfort is usually felt in the top of the uterus and spreads to the lower abdomen and low back.   True labor can be determined by your health care provider with an exam. This will show that the cervix is dilating and getting thinner.  WHAT TO REMEMBER  Keep up with your usual exercises and follow other instructions given by your health care provider.   Take medicines as directed by your health care provider.   Keep your regular prenatal appointments.   Eat and drink lightly if you think you are going into labor.   If Braxton Hicks contractions are making you uncomfortable:   Change your position from lying down or resting to walking, or from walking to resting.   Sit and rest in a tub of warm water.   Drink 2-3 glasses of water. Dehydration may cause these contractions.   Do slow and deep breathing several times an hour.  WHEN SHOULD I SEEK IMMEDIATE MEDICAL CARE? Seek immediate medical care if:  Your contractions become stronger, more regular, and closer together.   You have fluid leaking or gushing from your vagina.   You have a fever.   You pass blood-tinged mucus.   You have vaginal bleeding.   You have continuous abdominal pain.   You have low back pain that you never had before.   You feel your baby's head pushing down and causing pelvic pressure.   Your baby is not moving as much as it used to.  This information is not intended to replace advice given to you by your health care provider. Make sure  you discuss any questions you have with your health care provider. Document Released: 04/12/2005 Document Revised: 08/04/2015 Document Reviewed: 01/22/2013 Elsevier Interactive Patient Education  2017 [employer]  What is a fetal movement count? A fetal movement count is the number of times that you feel your baby move during a certain amount of time. This may also be called a fetal kick count. A fetal movement count is recommended for every  pregnant woman. You may be asked to start counting fetal movements as early as week 28 of your pregnancy. Pay attention to when your baby is most active. You may notice your baby's sleep and wake cycles. You may also notice things that make your baby move more. You should do a fetal movement count:  When your baby is normally most active.  At the same time each day. A good time to count movements is while you are resting, after having something to eat and drink. How do I count fetal movements? 1. Find a quiet, comfortable area. Sit, or lie down on your side. 2. Write down the date, the start time and stop time, and the number of movements that you felt between those two times. Take this information with you to your health care visits. 3. For 2 hours, count kicks, flutters, swishes, rolls, and jabs. You should feel at least 10 movements during 2 hours. 4. You may stop counting after you have felt 10 movements. 5. If you do not feel 10 movements in 2 hours, have something to eat and drink. Then, keep resting and counting for 1 hour. If you feel at least 4 movements during that hour, you may stop counting. Contact a health care provider if:  You feel fewer than 4 movements in 2 hours.  Your baby is not moving like he or she usually does. Date: ____________ Start time: ____________ Stop time: ____________ Movements: ____________ Date: ____________ Start time: ____________ Stop time: ____________ Movements: ____________ Date: ____________ Start time: ____________ Stop time: ____________ Movements: ____________ Date: ____________ Start time: ____________ Stop time: ____________ Movements: ____________ Date: ____________ Start time: ____________ Stop time: ____________ Movements: ____________ Date: ____________ Start time: ____________ Stop time: ____________ Movements: ____________ Date: ____________ Start time: ____________ Stop time: ____________ Movements: ____________ Date: ____________ Start  time: ____________ Stop time: ____________ Movements: ____________ Date: ____________ Start time: ____________ Stop time: ____________ Movements: ____________ This information is not intended to replace advice given to you by your health care provider. Make sure you discuss any questions you have with your health care provider. Document Released: 05/12/2006 Document Revised: 12/10/2015 Document Reviewed: 05/22/2015 Elsevier Interactive Patient Education  2017 [employer]

## 2016-03-31 NOTE — MAU Provider Note (Signed)
Patient seen and evaluated for ROM. She reports leaking of thick fluid since earlier tonight. She reports mild contractions. No vaginal bleeding or loss of fluid. She reports regular fetal movement  PE:  Vaginal: no pooling, thick mucus present, fern negative FHTS reactive baseline 140's. No contraction on toco.    A/P:  No rupture of membranes, Liley physiologic mucus discharge. Ok for D/C home.

## 2016-04-01 ENCOUNTER — Encounter (HOSPITAL_COMMUNITY): Payer: Self-pay | Admitting: *Deleted

## 2016-04-01 ENCOUNTER — Inpatient Hospital Stay (HOSPITAL_COMMUNITY)
Admission: AD | Admit: 2016-04-01 | Discharge: 2016-04-04 | DRG: 775 | Disposition: A | Discharge status: Home / Self Care | Payer: Medicaid Other | Source: Ambulatory Visit | Attending: Obstetrics and Gynecology | Admitting: Obstetrics and Gynecology

## 2016-04-01 ENCOUNTER — Encounter (HOSPITAL_COMMUNITY): Payer: Self-pay

## 2016-04-01 ENCOUNTER — Inpatient Hospital Stay (HOSPITAL_COMMUNITY): Payer: Medicaid Other | Admitting: Anesthesiology

## 2016-04-01 ENCOUNTER — Inpatient Hospital Stay (HOSPITAL_COMMUNITY)
Admission: AD | Admit: 2016-04-01 | Discharge: 2016-04-01 | Disposition: A | Discharge status: Home / Self Care | Payer: Medicaid Other | Source: Ambulatory Visit | Attending: Obstetrics and Gynecology | Admitting: Obstetrics and Gynecology

## 2016-04-01 DIAGNOSIS — Z8249 Family history of ischemic heart disease and other diseases of the circulatory system: Secondary | ICD-10-CM

## 2016-04-01 DIAGNOSIS — O99824 Streptococcus B carrier state complicating childbirth: Secondary | ICD-10-CM | POA: Diagnosis present

## 2016-04-01 DIAGNOSIS — Z6841 Body Mass Index (BMI) 40.0 and over, adult: Secondary | ICD-10-CM | POA: Diagnosis not present

## 2016-04-01 DIAGNOSIS — O99214 Obesity complicating childbirth: Secondary | ICD-10-CM | POA: Diagnosis present

## 2016-04-01 DIAGNOSIS — O99340 Other mental disorders complicating pregnancy, unspecified trimester: Secondary | ICD-10-CM

## 2016-04-01 DIAGNOSIS — O99210 Obesity complicating pregnancy, unspecified trimester: Secondary | ICD-10-CM

## 2016-04-01 DIAGNOSIS — Z87891 Personal history of nicotine dependence: Secondary | ICD-10-CM

## 2016-04-01 DIAGNOSIS — F419 Anxiety disorder, unspecified: Secondary | ICD-10-CM

## 2016-04-01 DIAGNOSIS — O99019 Anemia complicating pregnancy, unspecified trimester: Secondary | ICD-10-CM

## 2016-04-01 DIAGNOSIS — Z3493 Encounter for supervision of normal pregnancy, unspecified, third trimester: Secondary | ICD-10-CM | POA: Diagnosis present

## 2016-04-01 DIAGNOSIS — Z3490 Encounter for supervision of normal pregnancy, unspecified, unspecified trimester: Secondary | ICD-10-CM

## 2016-04-01 DIAGNOSIS — Z3A38 38 weeks gestation of pregnancy: Secondary | ICD-10-CM

## 2016-04-01 LAB — TYPE AND SCREEN
ABO/RH(D): O POS
Antibody Screen: NEGATIVE

## 2016-04-01 LAB — CBC
HEMATOCRIT: 41.7 % (ref 36.0–46.0)
HEMOGLOBIN: 14.6 g/dL (ref 12.0–15.0)
MCH: 30.6 pg (ref 26.0–34.0)
MCHC: 35 g/dL (ref 30.0–36.0)
MCV: 87.4 fL (ref 78.0–100.0)
PLATELETS: 185 10*3/uL (ref 150–400)
RBC: 4.77 MIL/uL (ref 3.87–5.11)
RDW: 15 % (ref 11.5–15.5)
WBC: 12 10*3/uL — AB (ref 4.0–10.5)

## 2016-04-01 MED ORDER — LIDOCAINE HCL (PF) 1 % IJ SOLN
30.0000 mL | INTRAMUSCULAR | Status: DC | PRN
  Administered 2016-04-02: 30 mL via SUBCUTANEOUS
  Filled 2016-04-01: qty 30

## 2016-04-01 MED ORDER — EPHEDRINE 5 MG/ML INJ
10.0000 mg | INTRAVENOUS | Status: DC | PRN
  Filled 2016-04-01: qty 4

## 2016-04-01 MED ORDER — MORPHINE SULFATE (PF) 4 MG/ML IV SOLN
4.0000 mg | Freq: Once | INTRAVENOUS | Status: AC
  Administered 2016-04-01: 4 mg via INTRAVENOUS
  Filled 2016-04-01: qty 1

## 2016-04-01 MED ORDER — PENICILLIN G POTASSIUM 5000000 UNITS IJ SOLR
5.0000 10*6.[IU] | Freq: Once | INTRAVENOUS | Status: AC
  Administered 2016-04-01: 5 10*6.[IU] via INTRAVENOUS
  Filled 2016-04-01: qty 5

## 2016-04-01 MED ORDER — OXYCODONE-ACETAMINOPHEN 5-325 MG PO TABS: 1.0000 | ORAL_TABLET | ORAL | Status: DC | PRN

## 2016-04-01 MED ORDER — SOD CITRATE-CITRIC ACID 500-334 MG/5ML PO SOLN: 30.0000 mL | ORAL | Status: DC | PRN

## 2016-04-01 MED ORDER — ACETAMINOPHEN 325 MG PO TABS: 650.0000 mg | ORAL_TABLET | ORAL | Status: DC | PRN

## 2016-04-01 MED ORDER — OXYTOCIN 40 UNITS IN LACTATED RINGERS INFUSION - SIMPLE MED
2.5000 [IU]/h | INTRAVENOUS | Status: DC
  Filled 2016-04-01: qty 1000

## 2016-04-01 MED ORDER — FENTANYL 2.5 MCG/ML BUPIVACAINE 1/10 % EPIDURAL INFUSION (WH - ANES)
14.0000 mL/h | INTRAMUSCULAR | Status: DC | PRN
  Administered 2016-04-01 (×2): 14 mL/h via EPIDURAL
  Filled 2016-04-01 (×2): qty 100

## 2016-04-01 MED ORDER — PHENYLEPHRINE 40 MCG/ML (10ML) SYRINGE FOR IV PUSH (FOR BLOOD PRESSURE SUPPORT)
80.0000 ug | PREFILLED_SYRINGE | INTRAVENOUS | Status: AC | PRN
  Administered 2016-04-01 (×3): 80 ug via INTRAVENOUS

## 2016-04-01 MED ORDER — DIPHENHYDRAMINE HCL 50 MG/ML IJ SOLN: 12.5000 mg | INTRAMUSCULAR | Status: DC | PRN

## 2016-04-01 MED ORDER — OXYTOCIN BOLUS FROM INFUSION
500.0000 mL | Freq: Once | INTRAVENOUS | Status: AC
  Administered 2016-04-02: 500 mL via INTRAVENOUS

## 2016-04-01 MED ORDER — LACTATED RINGERS IV SOLN: 500.0000 mL | INTRAVENOUS | Status: DC | PRN

## 2016-04-01 MED ORDER — LIDOCAINE-EPINEPHRINE (PF) 2 %-1:200000 IJ SOLN
INTRAMUSCULAR | Status: DC | PRN
  Administered 2016-04-01: 9 mL via EPIDURAL

## 2016-04-01 MED ORDER — PHENYLEPHRINE 40 MCG/ML (10ML) SYRINGE FOR IV PUSH (FOR BLOOD PRESSURE SUPPORT)
80.0000 ug | PREFILLED_SYRINGE | INTRAVENOUS | Status: DC | PRN
  Filled 2016-04-01: qty 5
  Filled 2016-04-01 (×2): qty 10

## 2016-04-01 MED ORDER — OXYCODONE-ACETAMINOPHEN 5-325 MG PO TABS: 2.0000 | ORAL_TABLET | ORAL | Status: DC | PRN

## 2016-04-01 MED ORDER — LACTATED RINGERS IV SOLN
INTRAVENOUS | Status: DC
  Administered 2016-04-01: 14:00:00 via INTRAVENOUS

## 2016-04-01 MED ORDER — LIDOCAINE HCL (PF) 1 % IJ SOLN
INTRAMUSCULAR | Status: DC | PRN
  Administered 2016-04-01: 4 mL
  Administered 2016-04-01: 6 mL via EPIDURAL

## 2016-04-01 MED ORDER — PENICILLIN G POT IN DEXTROSE 60000 UNIT/ML IV SOLN
3.0000 10*6.[IU] | INTRAVENOUS | Status: DC
  Administered 2016-04-01 (×2): 3 10*6.[IU] via INTRAVENOUS
  Filled 2016-04-01 (×6): qty 50

## 2016-04-01 MED ORDER — LACTATED RINGERS IV SOLN: 500.0000 mL | Freq: Once | INTRAVENOUS | Status: DC

## 2016-04-01 MED ORDER — BUTORPHANOL TARTRATE 1 MG/ML IJ SOLN: 1.0000 mg | INTRAMUSCULAR | Status: DC | PRN

## 2016-04-01 MED ORDER — ONDANSETRON HCL 4 MG/2ML IJ SOLN: 4.0000 mg | Freq: Four times a day (QID) | INTRAMUSCULAR | Status: DC | PRN

## 2016-04-01 MED ORDER — FLEET ENEMA 7-19 GM/118ML RE ENEM: 1.0000 | ENEMA | RECTAL | Status: DC | PRN

## 2016-04-01 NOTE — MAU Note (Signed)
Pt reports having increased ctxs all day. About 3-5 min apart. 3cm last night was givne morphine and sent home.

## 2016-04-01 NOTE — MAU Note (Signed)
Pt was sent home around 7 pm and reports that contractions started about 45 minutes after she left. Pt reports that the contractions were 6 minutes apart, but became every 3 minutes before coming to the hospital

## 2016-04-01 NOTE — Discharge Instructions (Signed)
Introduction Patient Name: ________________________________________________ Patient Due Date: ____________________ What is a fetal movement count? A fetal movement count is the number of times that you feel your baby move during a certain amount of time. This may also be called a fetal kick count. A fetal movement count is recommended for every pregnant woman. You may be asked to start counting fetal movements as early as week 28 of your pregnancy. Pay attention to when your baby is most active. You may notice your baby's sleep and wake cycles. You may also notice things that make your baby move more. You should do a fetal movement count:  When your baby is normally most active.  At the same time each day. A good time to count movements is while you are resting, after having something to eat and drink. How do I count fetal movements? 1. Find a quiet, comfortable area. Sit, or lie down on your side. 2. Write down the date, the start time and stop time, and the number of movements that you felt between those two times. Take this information with you to your health care visits. 3. For 2 hours, count kicks, flutters, swishes, rolls, and jabs. You should feel at least 10 movements during 2 hours. 4. You may stop counting after you have felt 10 movements. 5. If you do not feel 10 movements in 2 hours, have something to eat and drink. Then, keep resting and counting for 1 hour. If you feel at least 4 movements during that hour, you may stop counting. Contact a health care provider if:  You feel fewer than 4 movements in 2 hours.  Your baby is not moving like he or she usually does. Date: ____________ Start time: ____________ Stop time: ____________ Movements: ____________ Date: ____________ Start time: ____________ Stop time: ____________ Movements: ____________ Date: ____________ Start time: ____________ Stop time: ____________ Movements: ____________ Date: ____________ Start time: ____________  Stop time: ____________ Movements: ____________ Date: ____________ Start time: ____________ Stop time: ____________ Movements: ____________ Date: ____________ Start time: ____________ Stop time: ____________ Movements: ____________ Date: ____________ Start time: ____________ Stop time: ____________ Movements: ____________ Date: ____________ Start time: ____________ Stop time: ____________ Movements: ____________ Date: ____________ Start time: ____________ Stop time: ____________ Movements: ____________ This information is not intended to replace advice given to you by your health care provider. Make sure you discuss any questions you have with your health care provider. Document Released: 05/12/2006 Document Revised: 12/10/2015 Document Reviewed: 05/22/2015 Elsevier Interactive Patient Education  2017 [employer] Braxton Hicks Contractions Contractions of the uterus can occur throughout pregnancy. Contractions are not always a sign that you are in labor.  WHAT ARE BRAXTON HICKS CONTRACTIONS?  Contractions that occur before labor are called Braxton Hicks contractions, or false labor. Toward the end of pregnancy (32-34 weeks), these contractions can develop more often and may become more forceful. This is not true labor because these contractions do not result in opening (dilatation) and thinning of the cervix. They are sometimes difficult to tell apart from true labor because these contractions can be forceful and people have different pain tolerances. You should not feel embarrassed if you go to the hospital with false labor. Sometimes, the only way to tell if you are in true labor is for your health care provider to look for changes in the cervix. If there are no prenatal problems or other health problems associated with the pregnancy, it is completely safe to be sent home with false labor and await the onset of true labor.   HOW CAN YOU TELL THE DIFFERENCE BETWEEN TRUE AND FALSE LABOR? False Labor     The contractions of false labor are usually shorter and not as hard as those of true labor.   The contractions are usually irregular.   The contractions are often felt in the front of the lower abdomen and in the groin.   The contractions may go away when you walk around or change positions while lying down.   The contractions get weaker and are shorter lasting as time goes on.   The contractions do not usually become progressively stronger, regular, and closer together as with true labor.  True Labor   Contractions in true labor last 30-70 seconds, become very regular, usually become more intense, and increase in frequency.   The contractions do not go away with walking.   The discomfort is usually felt in the top of the uterus and spreads to the lower abdomen and low back.   True labor can be determined by your health care provider with an exam. This will show that the cervix is dilating and getting thinner.  WHAT TO REMEMBER  Keep up with your usual exercises and follow other instructions given by your health care provider.   Take medicines as directed by your health care provider.   Keep your regular prenatal appointments.   Eat and drink lightly if you think you are going into labor.   If Braxton Hicks contractions are making you uncomfortable:   Change your position from lying down or resting to walking, or from walking to resting.   Sit and rest in a tub of warm water.   Drink 2-3 glasses of water. Dehydration may cause these contractions.   Do slow and deep breathing several times an hour.  WHEN SHOULD I SEEK IMMEDIATE MEDICAL CARE? Seek immediate medical care if:  Your contractions become stronger, more regular, and closer together.   You have fluid leaking or gushing from your vagina.   You have a fever.   You pass blood-tinged mucus.   You have vaginal bleeding.   You have continuous abdominal pain.   You have low back pain  that you never had before.   You feel your baby's head pushing down and causing pelvic pressure.   Your baby is not moving as much as it used to.  This information is not intended to replace advice given to you by your health care provider. Make sure you discuss any questions you have with your health care provider. Document Released: 04/12/2005 Document Revised: 08/04/2015 Document Reviewed: 01/22/2013 Elsevier Interactive Patient Education  2017 [employer].  

## 2016-04-01 NOTE — Anesthesia Procedure Notes (Signed)

## 2016-04-01 NOTE — Anesthesia Preprocedure Evaluation (Signed)

## 2016-04-01 NOTE — H&P (Signed)
LABOR AND DELIVERY ADMISSION HISTORY AND PHYSICAL NOTE  Cynthia Horne is a 25 y.o. female G1P0 with IUP at 2665w1d presenting for SOL.    She reports positive fetal movement. She denies leakage of fluid. No frank vaginal bleeding, but discharge with blood and mucus that has since cleared up.   Clinic  [facility] Prenatal Labs  Dating LMP consistent with 6 wk us-BEDC 04/14/16 Blood type: O/POS/-- (06/14 1521)   Genetic Screen 1 Screen: Desired, no appt was available;  Quad: neg    Antibody:NEG (06/14 1521)  Anatomic US  nml, posterior placenta Rubella: 3.23 (06/14 1521)  GTT Early: 64              Third trimester: 117 RPR: NON REAC (06/14 1521)   Flu vaccine  Declines HBsAg: NEGATIVE (06/14 1521)   TDaP vaccine    01/23/16                                HIV: NONREACTIVE (06/14 1521)   Baby Food  Breast                                             GBS: (For PCN allergy, check sensitivities) positive  Contraception  Depo Pap: negative (10/08/15)  Circumcision  yes UDS neg  Pediatrician  list given   Support Person  Miguel      Prenatal History/Complications:  Past Medical History: Past Medical History:  Diagnosis Date  . Anemia   . Migraine   . Ovarian cyst     Past Surgical History: Past Surgical History:  Procedure Laterality Date  . OOPHORECTOMY Right     Obstetrical History: OB History    Gravida Para Term Preterm AB Living   1         0   SAB TAB Ectopic Multiple Live Births                  Social History: Social History   Social History  . Marital status: Single    Spouse name: N/A  . Number of children: N/A  . Years of education: N/A   Social History Main Topics  . Smoking status: Former Smoker    Packs/day: 0.50    Types: Cigarettes, Cigars  . Smokeless tobacco: Former NeurosurgeonUser     Comment: quit with preg  . Alcohol use No  . Drug use: No  . Sexual activity: Yes    Birth control/ protection: None   Other Topics Concern  . None   Social History  Narrative  . None    Family History: Family History  Problem Relation Age of Onset  . Hypertension Maternal Grandmother   . Cancer Maternal Grandfather     pancreatic    Allergies: No Known Allergies  Prescriptions Prior to Admission  Medication Sig Dispense Refill Last Dose  . ferrous sulfate (FERROUSUL) 325 (65 FE) MG tablet Take 1 tablet (325 mg total) by mouth 2 (two) times daily with a meal. (Patient taking differently: Take 325 mg by mouth at bedtime. ) 60 tablet 1 03/31/2016 at Unknown time  . Prenat w/o A Vit-FeFum-FePo-FA (CONCEPT OB) 130-92.4-1 MG CAPS Take 1 tablet by mouth daily. 30 capsule 12 03/31/2016 at Unknown time     Review of Systems   All systems reviewed and  negative except as stated in HPI  Blood pressure 130/67, pulse (!) 125, temperature 97.8 F (36.6 C), temperature source Oral, resp. rate 20, last menstrual period 07/13/2015. General appearance: alert, cooperative and morbidly obese Lungs: clear to auscultation bilaterally Heart: regular rate and rhythm Abdomen: soft, non-tender; bowel sounds normal Extremities: No calf swelling or tenderness Presentation: cephalic Fetal monitoring: Cat. I. 145. Moderate variability. No decels.  Uterine activity: external monitor  Dilation: 5 Effacement (%): 100 Station: -2 Exam by:: K.WIlosn,RN   Prenatal labs: ABO, Rh: --/--/O POS (12/07 1410) Antibody: PENDING (12/07 1410) Rubella: !Error! RPR: NON REAC (09/29 1034)  HBsAg: NEGATIVE (06/14 1521)  HIV: NONREACTIVE (09/29 1034)  GBS: Positive (11/27 0000)  1 hr Glucola: wnl 117 Genetic screening:  Quad neg Anatomy US: normal, posterior placenta  Prenatal Transfer Tool  Maternal Diabetes: No Genetic Screening: Normal Maternal Ultrasounds/Referrals: Normal Fetal Ultrasounds or other Referrals:  None Maternal Substance Abuse:  No Significant Maternal Medications:  None Significant Maternal Lab Results: None  Results for orders placed or performed  during the hospital encounter of 04/01/16 (from the past 24 hour(s))  CBC   Collection Time: 04/01/16  2:10 PM  Result Value Ref Range   WBC 12.0 (H) 4.0 - 10.5 K/uL   RBC 4.77 3.87 - 5.11 MIL/uL   Hemoglobin 14.6 12.0 - 15.0 g/dL   HCT 30.841.7 65.736.0 - 84.646.0 %   MCV 87.4 78.0 - 100.0 fL   MCH 30.6 26.0 - 34.0 pg   MCHC 35.0 30.0 - 36.0 g/dL   RDW 96.215.0 95.211.5 - 84.115.5 %   Platelets 185 150 - 400 K/uL  Type and screen [facility]   Collection Time: 04/01/16  2:10 PM  Result Value Ref Range   ABO/RH(D) O POS    Antibody Screen PENDING    Sample Expiration 04/04/2016   Results for orders placed or performed during the hospital encounter of 03/31/16 (from the past 24 hour(s))  Fern Test   Collection Time: 03/31/16  8:36 PM  Result Value Ref Range   POCT Fern Test Negative = intact amniotic membranes     Patient Active Problem List   Diagnosis Date Noted  . Peripartum hip pain 01/06/2016  . Migraine headache with aura 11/05/2015  . Anxiety in pregnancy, antepartum 11/05/2015  . Anemia affecting pregnancy, antepartum 10/08/2015  . Supervision of normal pregnancy, antepartum 10/08/2015  . Obesity affecting pregnancy, antepartum 10/08/2015  . Chlamydia infection affecting pregnancy in first trimester, antepartum 08/22/2015    Assessment: Cynthia Horne is a 25 y.o. G1P0 at 2069w1d here for SOL  #labor: progressing well without augmentation, intact but bulging, anticipate NSVD #Pain: Epidural #FWB: Category I, good fetal movement #ID:  GBS+, intrapartum PCN #MOF: Breast #MOC:Depo  Clearance Cootsndrew Tyson 04/01/2016, 2:59 PM   I have seen and examined the patient and I agree with the above management and plan. Luna KitchensKathryn Veida Spira CNM

## 2016-04-01 NOTE — Progress Notes (Signed)
Patient is 6-7, 90% effaced.  FHR is 135 with moderate variablity and accelerations. Recheck in two hours and consider AROM at that time.

## 2016-04-01 NOTE — Progress Notes (Signed)
Pt AROM'd, moderate amount of clear fluid. Resting comfortably with epidural, surrounded by supportive family.  Category I tracing: FHT 130s-140s, moderate variability, + accels, no decels. Moderate to strong contractions q 2-3 min.   Continue expectant management for NSVD.  Cynthia Horne, SNM

## 2016-04-02 ENCOUNTER — Encounter (HOSPITAL_COMMUNITY): Payer: Self-pay | Admitting: *Deleted

## 2016-04-02 DIAGNOSIS — Z3A38 38 weeks gestation of pregnancy: Secondary | ICD-10-CM

## 2016-04-02 DIAGNOSIS — O99824 Streptococcus B carrier state complicating childbirth: Secondary | ICD-10-CM

## 2016-04-02 LAB — RPR: RPR: NONREACTIVE

## 2016-04-02 MED ORDER — ZOLPIDEM TARTRATE 5 MG PO TABS: 5.0000 mg | ORAL_TABLET | Freq: Every evening | ORAL | Status: DC | PRN

## 2016-04-02 MED ORDER — IBUPROFEN 600 MG PO TABS
600.0000 mg | ORAL_TABLET | Freq: Four times a day (QID) | ORAL | Status: DC
  Administered 2016-04-02 – 2016-04-04 (×9): 600 mg via ORAL
  Filled 2016-04-02 (×9): qty 1

## 2016-04-02 MED ORDER — OXYCODONE HCL 5 MG PO TABS: 10.0000 mg | ORAL_TABLET | ORAL | Status: DC | PRN

## 2016-04-02 MED ORDER — ACETAMINOPHEN 325 MG PO TABS
650.0000 mg | ORAL_TABLET | ORAL | Status: DC | PRN
  Administered 2016-04-02: 650 mg via ORAL
  Filled 2016-04-02: qty 2

## 2016-04-02 MED ORDER — METHYLERGONOVINE MALEATE 0.2 MG PO TABS: 0.2000 mg | ORAL_TABLET | ORAL | Status: DC | PRN

## 2016-04-02 MED ORDER — FERROUS SULFATE 325 (65 FE) MG PO TABS
325.0000 mg | ORAL_TABLET | Freq: Two times a day (BID) | ORAL | Status: DC
  Administered 2016-04-02 – 2016-04-04 (×5): 325 mg via ORAL
  Filled 2016-04-02 (×5): qty 1

## 2016-04-02 MED ORDER — BISACODYL 10 MG RE SUPP: 10.0000 mg | Freq: Every day | RECTAL | Status: DC | PRN

## 2016-04-02 MED ORDER — MEASLES, MUMPS & RUBELLA VAC ~~LOC~~ INJ: 0.5000 mL | INJECTION | Freq: Once | SUBCUTANEOUS | Status: DC

## 2016-04-02 MED ORDER — WITCH HAZEL-GLYCERIN EX PADS: 1.0000 "application " | MEDICATED_PAD | CUTANEOUS | Status: DC | PRN

## 2016-04-02 MED ORDER — METHYLERGONOVINE MALEATE 0.2 MG/ML IJ SOLN
0.2000 mg | INTRAMUSCULAR | Status: DC | PRN
Start: 2016-04-02 — End: 2016-04-04

## 2016-04-02 MED ORDER — DIBUCAINE 1 % RE OINT: 1.0000 "application " | TOPICAL_OINTMENT | RECTAL | Status: DC | PRN

## 2016-04-02 MED ORDER — OXYCODONE HCL 5 MG PO TABS: 5.0000 mg | ORAL_TABLET | ORAL | Status: DC | PRN

## 2016-04-02 MED ORDER — ONDANSETRON HCL 4 MG/2ML IJ SOLN: 4.0000 mg | INTRAMUSCULAR | Status: DC | PRN

## 2016-04-02 MED ORDER — PRENATAL MULTIVITAMIN CH
1.0000 | ORAL_TABLET | Freq: Every day | ORAL | Status: DC
  Administered 2016-04-02 – 2016-04-03 (×2): 1 via ORAL
  Filled 2016-04-02 (×2): qty 1

## 2016-04-02 MED ORDER — FLEET ENEMA 7-19 GM/118ML RE ENEM: 1.0000 | ENEMA | Freq: Every day | RECTAL | Status: DC | PRN

## 2016-04-02 MED ORDER — COCONUT OIL OIL
1.0000 "application " | TOPICAL_OIL | Status: DC | PRN
  Administered 2016-04-03: 1 via TOPICAL
  Filled 2016-04-02: qty 120

## 2016-04-02 MED ORDER — DOCUSATE SODIUM 100 MG PO CAPS
100.0000 mg | ORAL_CAPSULE | Freq: Two times a day (BID) | ORAL | Status: DC
  Filled 2016-04-02 (×3): qty 1

## 2016-04-02 MED ORDER — TETANUS-DIPHTH-ACELL PERTUSSIS 5-2.5-18.5 LF-MCG/0.5 IM SUSP: 0.5000 mL | Freq: Once | INTRAMUSCULAR | Status: DC

## 2016-04-02 MED ORDER — SIMETHICONE 80 MG PO CHEW: 80.0000 mg | CHEWABLE_TABLET | ORAL | Status: DC | PRN

## 2016-04-02 MED ORDER — ONDANSETRON HCL 4 MG PO TABS: 4.0000 mg | ORAL_TABLET | ORAL | Status: DC | PRN

## 2016-04-02 MED ORDER — DIPHENHYDRAMINE HCL 25 MG PO CAPS: 25.0000 mg | ORAL_CAPSULE | Freq: Four times a day (QID) | ORAL | Status: DC | PRN

## 2016-04-02 MED ORDER — BENZOCAINE-MENTHOL 20-0.5 % EX AERO
1.0000 "application " | INHALATION_SPRAY | CUTANEOUS | Status: DC | PRN
  Administered 2016-04-02: 1 via TOPICAL
  Filled 2016-04-02: qty 56

## 2016-04-02 NOTE — Progress Notes (Signed)
Pt comfortable after additional medication from Anesthesia. Poor contraction tracing due to left lateral position. Category I tracing, FHT baseline 145, moderate variability, + accels, no decels. Continue expectant management.  Clayton BiblesSamantha Jakobe Blau, SNM

## 2016-04-02 NOTE — Progress Notes (Signed)
At 1545 pt called me in for breakthrough back pain at a 10/10.  Because of the sudden onset I did a focused physical assessment and checked her blood pressure, which read 88/53. I called Dr. Chales Abrahamsyson to come evaluate the patient, he determined it was best to continue to monitor the blood pressure as the patient had been running low throughout the admission. I checked the BP again at 1600 and it was 104/45, closer to what the patient had been reading throughout admission. It was decided that acetaminophen should be the pain medication given at the time instead of oxycodone due to the abnormal Bp, but oxy could be given if the 10/10 pain continued upon reevaluation.

## 2016-04-02 NOTE — Lactation Note (Signed)
This note was copied from a baby's chart. Lactation Consultation Note  Patient Name: Cynthia Horne ZOXWR'UToday's Date: 04/02/2016 Reason for consult: Initial assessment Breastfeeding consultation services and support information given and reviewed.  This is mom's first baby and newborn is 7010 hours old.  Mom states baby has nursed well twice since birth.  Baby is currently sleeping on mom's chest skin to skin.  Reviewed feeding cues with mom and instructed to put baby to breast with any cue.  Instructed on normal newborn behavior, waking techniques and breast massage.  Encouraged to call for assist/concerns prn.  Maternal Data Does the patient have breastfeeding experience prior to this delivery?: No  Feeding    LATCH Score/Interventions                      Lactation Tools Discussed/Used     Consult Status Consult Status: Follow-up Date: 04/03/16 Follow-up type: In-patient    Huston FoleyMOULDEN, Roman Dubuc S 04/02/2016, 1:14 PM

## 2016-04-02 NOTE — Anesthesia Postprocedure Evaluation (Signed)
Anesthesia Post Note  Patient: Cynthia Horne  Procedure(s) Performed: * No procedures listed *  Patient location during evaluation: Mother Baby Anesthesia Type: Epidural Level of consciousness: awake Pain management: pain level controlled Vital Signs Assessment: post-procedure vital signs reviewed and stable Respiratory status: spontaneous breathing Cardiovascular status: stable Postop Assessment: no headache, no backache and epidural receding Anesthetic complications: no     Last Vitals:  Vitals:   04/02/16 0453 04/02/16 0546  BP: 133/62 (!) 115/56  Pulse: (!) 111 (!) 115  Resp: 18 18  Temp: 36.9 C 36.6 C    Last Pain:  Vitals:   04/02/16 0546  TempSrc: Oral  PainSc:    Pain Goal: Patients Stated Pain Goal: 0 (04/01/16 1326)               Edison PaceWILKERSON,Daeja Helderman

## 2016-04-03 MED ORDER — PNEUMOCOCCAL VAC POLYVALENT 25 MCG/0.5ML IJ INJ
0.5000 mL | INJECTION | INTRAMUSCULAR | Status: AC
Start: 2016-04-04 — End: 2016-04-04
  Administered 2016-04-04: 0.5 mL via INTRAMUSCULAR
  Filled 2016-04-03 (×2): qty 0.5

## 2016-04-03 NOTE — Progress Notes (Signed)
Pt requesting breast pump set up. DEBP set up explained and education provided on cleaning, use, and maintenance. Pt states that she will try to pump after she eats lunch. Encouraged to call out with questions/concerns. Sherald BargeMatthews, Trinidee Schrag L

## 2016-04-03 NOTE — Progress Notes (Signed)
Post Partum Day 1 Subjective: no complaints, up ad lib, voiding and tolerating PO  Objective: Blood pressure (!) 103/44, pulse 84, temperature 98.1 F (36.7 C), temperature source Oral, resp. rate 18, height 5\' 4"  (1.626 m), weight 284 lb (128.8 kg), last menstrual period 07/13/2015, SpO2 100 %, unknown if currently breastfeeding.  Physical Exam:  General: alert, cooperative and no distress Lochia: appropriate Uterine Fundus: firm Incision: healing well DVT Evaluation: No evidence of DVT seen on physical exam.   Recent Labs  04/01/16 1410  HGB 14.6  HCT 41.7    Assessment/Plan: Plan for discharge tomorrow, Breastfeeding and Contraception DepoProvera   LOS: 2 days   [facility] Center[facility]ILLIAMS,Cynthia Guaman 04/03/2016, 5:30 PM

## 2016-04-04 MED ORDER — IBUPROFEN 600 MG PO TABS: 600.0000 mg | ORAL_TABLET | Freq: Four times a day (QID) | ORAL | 0 refills | Status: DC

## 2016-04-04 NOTE — Discharge Instructions (Signed)

## 2016-04-04 NOTE — Discharge Summary (Signed)
Obstetric Discharge Summary Reason for Admission: onset of labor Prenatal Procedures: none Intrapartum Procedures: spontaneous vaginal delivery Postpartum Procedures: none Complications-Operative and Postpartum: 1st degree perineal laceration Hemoglobin  Date Value Ref Range Status  04/01/2016 14.6 12.0 - 15.0 g/dL Final   HCT  Date Value Ref Range Status  04/01/2016 41.7 36.0 - 46.0 % Final    Physical Exam:  General: alert, cooperative and no distress Lochia: appropriate Uterine Fundus: firm Incision: n/a DVT Evaluation: No evidence of DVT seen on physical exam. Negative Homan's sign. No cords or calf tenderness. No significant calf/ankle edema.  Discharge Diagnoses: Term Pregnancy-delivered  Discharge Information: Date: 04/04/2016 Activity: pelvic rest Diet: routine Medications: PNV and Ibuprofen Condition: stable Instructions: refer to practice specific booklet Discharge to: home  Contraception: Depo vs LARCs Follow-up Information    [facility] Schedule an appointment as soon as possible for a visit in 5 week(s).   Specialty:  Obstetrics and Gynecology Contact information: [address] [place] [phone]          Newborn Data: Live born female  Birth Weight: 8 lb 4.8 oz (3765 g) APGAR: 8, 9  Home with mother.  LEFTWICH-KIRBY, Evalie Hargraves 04/04/2016, 10:31 AM

## 2016-04-04 NOTE — Lactation Note (Signed)
This note was copied from a baby's chart. Lactation Consultation Note; Infant is 8556 hours old and at 8% weight loss. Mother has been breastfeeding with feeding cues and at least 8-12 feedings. Mother was sat up with a DEBP to pump and give infant extra calories. Mother unsure how much she is getting when she pump. Mother request a DEBP to take home. Mother states she is active with WIC. Plan is to rent mother a 2 week loaner so mother has time to contact wic for a pump. Mother has rented a Symphony pump from the hospital . Mothers plan is to pump every 2-3 hours and supplement infant after each feeding until milk comes to volume. Advised mother to massage breast when engorged and ice as needed. Mother is aware of available LC resources.   Patient Name: Cynthia Horne Reason for consult: Follow-up assessment   Maternal Data    Feeding Feeding Type: Breast Fed  LATCH Score/Interventions Latch: Grasps breast easily, tongue down, lips flanged, rhythmical sucking.  Audible Swallowing: Spontaneous and intermittent  Type of Nipple: Everted at rest and after stimulation  Comfort (Breast/Nipple): Filling, red/small blisters or bruises, mild/mod discomfort  Problem noted: Mild/Moderate discomfort  Hold (Positioning): Assistance needed to correctly position infant at breast and maintain latch.  LATCH Score: 8  Lactation Tools Discussed/Used     Consult Status      Cynthia Horne, Cynthia Horne Horne, 11:14 AM

## 2016-04-04 NOTE — Clinical Social Work Maternal (Signed)
  CLINICAL SOCIAL WORK MATERNAL/CHILD NOTE  Patient Details  Name: Cynthia Horne MRN: [id number] Date of Birth: 1990/12/19  Date:  04/04/2016  Clinical Social Worker Initiating Note:  Ferdinand Lango Holger Sokolowski, MSW, LCSW-A  Date/ Time Initiated:  04/04/16/1038     Child's Name:  Cynthia Horne    Legal Guardian:  Other (Comment) (Not established by court system; MOB and FOB will parent collectively )   Need for Interpreter:  None   Date of Referral:  04/02/16     Reason for Referral:  Other (Comment) (MOB hx of depression in pregnancy )   Referral Source:  Central Nursery   Address:  [place] [address]  Phone number:  [phone]   Household Members:  Self   Natural Supports (not living in the home):  Immediate Family, Friends, Extended Family   Professional Supports: None   Employment:     Type of Work:     Education:      Pensions consultant:  [employer]   Other Resources:  [employer], Physicist, medical    Cultural/Religious Considerations Which May Impact Care:  None reported at this time.   Strengths:  Ability to meet basic needs , Pediatrician chosen , Compliance with medical plan , Home prepared for child  [facility] )   Risk Factors/Current Problems:  Other (Comment) (FOB is currently in jail for at least 2 years )   Cognitive State:  Alert , Able to Concentrate , Goal Oriented , Insightful    Mood/Affect:  Calm , Comfortable , Interested    CSW Assessment: CSW met with MOB at bedside to complete assessment. At this time, MOB was resting in bed while baby was asleep. This Probation officer explained role and reasoning for visit being due to her hx of depression during pregnancy. MOB notes she did experience some depression due to FOB being in jail for at least the next two years and it was a lot to cope with. MOB notes she is doing much better now and has developed coping skills. She notes never having to be on medication for it  and does not want to ever be. At this time, MOB denies any other psychosocial needs at this time. CSW has no concerns for d/c. Case closed to this CSW.   CSW Plan/Description:  No Further Intervention Required/No Barriers to Discharge   Oda Cogan, MSW, [facility]  Office: [phone]

## 2016-04-06 ENCOUNTER — Encounter: Payer: Self-pay | Admitting: Advanced Practice Midwife

## 2016-04-09 ENCOUNTER — Telehealth: Payer: Self-pay | Admitting: *Deleted

## 2016-04-09 NOTE — Telephone Encounter (Signed)
I called Ala at her number and left a message I was calling her in response to a message from a family member-and wanted to discuss with her - please call us next week / we will try again. If you have an urgent issue may go to [facility].

## 2016-04-09 NOTE — Telephone Encounter (Signed)
Mata's Grandmother Leanord HawkingPatricia Mazurowski left a message yesterday afternoon  That Daniele  has fluids in her feet- States she knows about  Fluids and that they can go anywhere.  States she wants us to call Ylonda and give her some medicine to get the fluid out because we let her leave the hospital with that fluid. States she needs help because of the fluid and wants us to call Rhaelyn.

## 2016-04-12 ENCOUNTER — Encounter: Payer: Self-pay | Admitting: Certified Nurse Midwife

## 2016-04-12 NOTE — Telephone Encounter (Signed)
Called pt and left message stating that this is a second attempt to reach her in regards to a phone call we received last week. A family member called and expressed concerns for her health and well-being. Please call back if you have questions, concerns or are in need of medical advice.

## 2016-04-13 ENCOUNTER — Encounter: Payer: Self-pay | Admitting: Family Medicine

## 2016-04-21 ENCOUNTER — Ambulatory Visit (HOSPITAL_COMMUNITY): Payer: Medicaid Other | Attending: Obstetrics & Gynecology

## 2016-05-12 ENCOUNTER — Ambulatory Visit: Payer: Self-pay | Admitting: Certified Nurse Midwife

## 2016-06-07 ENCOUNTER — Ambulatory Visit (INDEPENDENT_AMBULATORY_CARE_PROVIDER_SITE_OTHER): Payer: Medicaid Other | Admitting: Obstetrics and Gynecology

## 2016-06-07 ENCOUNTER — Encounter: Payer: Self-pay | Admitting: Obstetrics and Gynecology

## 2016-06-07 DIAGNOSIS — Z30013 Encounter for initial prescription of injectable contraceptive: Secondary | ICD-10-CM

## 2016-06-07 DIAGNOSIS — Z3202 Encounter for pregnancy test, result negative: Secondary | ICD-10-CM | POA: Diagnosis not present

## 2016-06-07 LAB — POCT PREGNANCY, URINE: Preg Test, Ur: NEGATIVE

## 2016-06-07 NOTE — Patient Instructions (Signed)
Hormonal Contraception Information Introduction Estrogen and progesterone (progestin) are hormones used in many forms of birth control (contraception). These two hormones make up most hormonal contraceptives. Hormonal contraceptives use either:  A combination of estrogen hormone and progesterone hormone in one of these forms:  Pill. Pills come in various combinations of active hormone pills and nonhormonal pills. Different combinations of pills may give you a period once a month, once every 3 months, or no period at all. It is important to take the pills the same time each day.  Patch. The patch is placed on the lower abdomen every week for 3 weeks. On the fourth week, the patch is not placed.  Vaginal ring. The ring is placed in the vagina and left there for 3 weeks. It is then removed for 1 week.  Progesterone alone in one of these forms:  Pill. Hormone pills are taken every day of the cycle.  Intrauterine device (IUD). The IUD is inserted during a menstrual period and removed or replaced every 5 years or sooner.  Implant. Plastic rods are placed under the skin of the upper arm. They are removed or replaced every 3 years or sooner.  Injection. The injection is given once every 90 days. Pregnancy can still occur with any of these hormonal contraceptive methods. If you have any suspicion that you might be pregnant, take a pregnancy test and talk to your health care provider. Estrogen and progesterone contraceptives Estrogen and progesterone contraceptives can prevent pregnancy by:  Stopping the release of an egg (ovulation).  Thickening the mucus of the cervix, making it difficult for sperm to enter the uterus.  Changing the lining of the uterus. This change makes it more difficult for an egg to implant. Progesterone contraceptives Progesterone-only contraceptives can prevent pregnancy by:  Blocking ovulation. This occurs in many women, but some women will continue to  ovulate.  Preventing the entry of sperm into the uterus by keeping the cervical mucus thick and sticky.  Changing the lining of the uterus. This change makes it more difficult for an egg to implant. Side effects Talk to your health care provider about what side effects may affect you. If you develop persistent side effects or if the effects are severe, talk to your health care provider.  Estrogen. Side effects from estrogen occur more often in the first 2-3 months. They include:  Progesterone. Side effects of progesterone can vary. They include: Questions to ask This information is not intended to replace advice given to you by your health care provider. Make sure you discuss any questions you have with your health care provider. Document Released: 05/02/2007 Document Revised: 01/14/2016 Document Reviewed: 09/24/2012  2017 Elsevier  

## 2016-06-07 NOTE — Progress Notes (Signed)
Subjective:     Cynthia Horne is a 26 y.o. female who presents for a postpartum visit. She is 8 weeks postpartum following a spontaneous vaginal delivery. I have fully reviewed the prenatal and intrapartum course. The delivery was at 38 and 2 gestational weeks. Outcome: spontaneous vaginal delivery. Anesthesia: epidural. Postpartum course has been unremarkable. Baby's course has been unremarkable. Baby is feeding by breast. Bleeding no bleeding and changing a maxi pad every 2 hours. Bowel function is normal. Bladder function is normal. Patient is not sexually active. Contraception method is Depo-Provera injections. Postpartum depression screening: negative.  The following portions of the patient's history were reviewed and updated as appropriate: past family history, past social history and past surgical history.  Review of Systems Pertinent items are noted in HPI.   Objective:    BP (!) 129/50   Pulse 79   Wt 273 lb (123.8 kg)   LMP 06/06/2016 (Exact Date)   Breastfeeding? Yes   BMI 46.86 kg/m   General:  alert, cooperative and appears stated age   Breasts:  not examined in a lactating female  Lungs: clear to auscultation bilaterally  Heart:  regular rate and rhythm, S1, S2 normal, no murmur, click, rub or gallop  Abdomen: soft, non-tender; bowel sounds normal; no masses,  no organomegaly   Vulva:  not evaluated  Vagina: not evaluated  Cervix:  not evaluated  Corpus: not examined  Adnexa:  not evaluated  Rectal Exam: Not performed.        Assessment:     Normal postpartum exam. Pap smear not done at today's visit.  negative at the beginning of pregnancy  Plan:    1. Contraception: Depo-Provera injections. Counseled on concern that progesterone contraception may decrease milk supply.  2. Routine post partum care 3. Follow up in: 1 year or as needed.

## 2016-06-24 ENCOUNTER — Other Ambulatory Visit: Payer: Self-pay | Admitting: Certified Nurse Midwife

## 2016-08-23 ENCOUNTER — Ambulatory Visit: Payer: Self-pay

## 2016-08-26 ENCOUNTER — Ambulatory Visit (INDEPENDENT_AMBULATORY_CARE_PROVIDER_SITE_OTHER): Payer: Medicaid Other | Admitting: *Deleted

## 2016-08-26 DIAGNOSIS — Z3042 Encounter for surveillance of injectable contraceptive: Secondary | ICD-10-CM

## 2016-08-26 MED ORDER — MEDROXYPROGESTERONE ACETATE 150 MG/ML IM SUSP
150.0000 mg | Freq: Once | INTRAMUSCULAR | Status: AC
  Administered 2016-08-26: 150 mg via INTRAMUSCULAR

## 2016-11-10 ENCOUNTER — Ambulatory Visit: Payer: Self-pay

## 2016-12-21 ENCOUNTER — Emergency Department (HOSPITAL_COMMUNITY)
Admission: EM | Admit: 2016-12-21 | Discharge: 2016-12-21 | Disposition: A | Discharge status: Home / Self Care | Payer: No Typology Code available for payment source | Attending: Emergency Medicine | Admitting: Emergency Medicine

## 2016-12-21 ENCOUNTER — Encounter (HOSPITAL_COMMUNITY): Payer: Self-pay

## 2016-12-21 DIAGNOSIS — Y9389 Activity, other specified: Secondary | ICD-10-CM | POA: Insufficient documentation

## 2016-12-21 DIAGNOSIS — S39012A Strain of muscle, fascia and tendon of lower back, initial encounter: Secondary | ICD-10-CM | POA: Insufficient documentation

## 2016-12-21 DIAGNOSIS — Y92410 Unspecified street and highway as the place of occurrence of the external cause: Secondary | ICD-10-CM | POA: Diagnosis not present

## 2016-12-21 DIAGNOSIS — Z87891 Personal history of nicotine dependence: Secondary | ICD-10-CM | POA: Diagnosis not present

## 2016-12-21 DIAGNOSIS — Y999 Unspecified external cause status: Secondary | ICD-10-CM | POA: Diagnosis not present

## 2016-12-21 DIAGNOSIS — S3992XA Unspecified injury of lower back, initial encounter: Secondary | ICD-10-CM | POA: Diagnosis present

## 2016-12-21 MED ORDER — METHOCARBAMOL 500 MG PO TABS: 500.0000 mg | ORAL_TABLET | Freq: Three times a day (TID) | ORAL | 0 refills | Status: DC | PRN
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
## 2016-12-21 NOTE — ED Notes (Signed)
Bed: WTR6 Expected date:  Expected time:  Means of arrival:  Comments: 
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
## 2016-12-21 NOTE — Discharge Instructions (Signed)
You may alternate Tylenol 1000 mg every 6 hours as needed for pain and Ibuprofen 800 mg every 8 hours as needed for pain.  Please take Ibuprofen with food. ° ° ° °To find a primary care or specialty doctor please call [phone] or [phone] to access "[facility] Find a Doctor Service." ° °You may also go on the [facility] website at [email or web address] ° °There are also multiple [facility], [facility] practices throughout the [place] that are frequently accepting new patients. You may find a clinic that is close to your home and contact them. ° ° [facility] -  °[address] °[place] [address] °[phone] ° ° °[facility] -  °[address] °[place] [address] °[phone] ° ° °[facility] - °[address]  °[place] [address] °[phone] ° ° °

## 2017-06-28 ENCOUNTER — Encounter (HOSPITAL_COMMUNITY): Payer: Self-pay

## 2017-06-28 ENCOUNTER — Emergency Department (HOSPITAL_COMMUNITY)
Admission: EM | Admit: 2017-06-28 | Discharge: 2017-06-28 | Disposition: A | Discharge status: Home / Self Care | Payer: Self-pay | Attending: Emergency Medicine | Admitting: Emergency Medicine

## 2017-06-28 ENCOUNTER — Other Ambulatory Visit: Payer: Self-pay

## 2017-06-28 ENCOUNTER — Emergency Department (HOSPITAL_COMMUNITY): Payer: Self-pay

## 2017-06-28 ENCOUNTER — Encounter (HOSPITAL_COMMUNITY): Payer: Self-pay | Admitting: Emergency Medicine

## 2017-06-28 DIAGNOSIS — Z3490 Encounter for supervision of normal pregnancy, unspecified, unspecified trimester: Secondary | ICD-10-CM

## 2017-06-28 DIAGNOSIS — R110 Nausea: Secondary | ICD-10-CM | POA: Insufficient documentation

## 2017-06-28 DIAGNOSIS — R0982 Postnasal drip: Secondary | ICD-10-CM | POA: Insufficient documentation

## 2017-06-28 DIAGNOSIS — Z87891 Personal history of nicotine dependence: Secondary | ICD-10-CM | POA: Insufficient documentation

## 2017-06-28 DIAGNOSIS — J029 Acute pharyngitis, unspecified: Secondary | ICD-10-CM | POA: Insufficient documentation

## 2017-06-28 DIAGNOSIS — Z3201 Encounter for pregnancy test, result positive: Secondary | ICD-10-CM | POA: Insufficient documentation

## 2017-06-28 DIAGNOSIS — R590 Localized enlarged lymph nodes: Secondary | ICD-10-CM | POA: Insufficient documentation

## 2017-06-28 LAB — CBC WITH DIFFERENTIAL/PLATELET
BASOS PCT: 0 %
Basophils Absolute: 0 10*3/uL (ref 0.0–0.1)
Eosinophils Absolute: 0 10*3/uL (ref 0.0–0.7)
Eosinophils Relative: 0 %
HEMATOCRIT: 37.3 % (ref 36.0–46.0)
Hemoglobin: 12.4 g/dL (ref 12.0–15.0)
Lymphocytes Relative: 13 %
Lymphs Abs: 1.8 10*3/uL (ref 0.7–4.0)
MCH: 28.2 pg (ref 26.0–34.0)
MCHC: 33.2 g/dL (ref 30.0–36.0)
MCV: 85 fL (ref 78.0–100.0)
Monocytes Absolute: 1.2 10*3/uL — ABNORMAL HIGH (ref 0.1–1.0)
Monocytes Relative: 8 %
NEUTROS ABS: 11.4 10*3/uL — AB (ref 1.7–7.7)
NEUTROS PCT: 79 %
Platelets: 186 10*3/uL (ref 150–400)
RBC: 4.39 MIL/uL (ref 3.87–5.11)
RDW: 16.4 % — AB (ref 11.5–15.5)
WBC: 14.5 10*3/uL — AB (ref 4.0–10.5)

## 2017-06-28 LAB — BASIC METABOLIC PANEL
ANION GAP: 10 (ref 5–15)
BUN: <5 mg/dL — ABNORMAL LOW (ref 6–20)
CALCIUM: 8.8 mg/dL — AB (ref 8.9–10.3)
CO2: 22 mmol/L (ref 22–32)
Chloride: 105 mmol/L (ref 101–111)
Creatinine, Ser: 0.56 mg/dL (ref 0.44–1.00)
GFR calc non Af Amer: >60 mL/min (ref >60)
Glucose, Bld: 104 mg/dL — ABNORMAL HIGH (ref 65–99)
Potassium: 3.4 mmol/L — ABNORMAL LOW (ref 3.5–5.1)
Sodium: 137 mmol/L (ref 135–145)

## 2017-06-28 LAB — I-STAT BETA HCG BLOOD, ED (MC, WL, AP ONLY): I-stat hCG, quantitative: >2000 m[IU]/mL — ABNORMAL HIGH (ref <5)

## 2017-06-28 LAB — HCG, QUANTITATIVE, PREGNANCY: HCG, BETA CHAIN, QUANT, S: 11689 m[IU]/mL — AB (ref <5)

## 2017-06-28 LAB — RAPID STREP SCREEN (MED CTR MEBANE ONLY): Streptococcus, Group A Screen (Direct): NEGATIVE

## 2017-06-28 MED ORDER — SODIUM CHLORIDE 0.9 % IJ SOLN
INTRAMUSCULAR | Status: AC
  Administered 2017-06-28: 11:00:00
  Filled 2017-06-28: qty 50

## 2017-06-28 MED ORDER — ONDANSETRON HCL 4 MG/2ML IJ SOLN
4.0000 mg | Freq: Once | INTRAMUSCULAR | Status: AC
  Administered 2017-06-28: 4 mg via INTRAVENOUS
  Filled 2017-06-28: qty 2

## 2017-06-28 MED ORDER — IOPAMIDOL (ISOVUE-300) INJECTION 61%
INTRAVENOUS | Status: AC
  Administered 2017-06-28: 100 mL
  Filled 2017-06-28: qty 100

## 2017-06-28 MED ORDER — DEXAMETHASONE SODIUM PHOSPHATE 10 MG/ML IJ SOLN
10.0000 mg | Freq: Once | INTRAMUSCULAR | Status: AC
  Administered 2017-06-28: 10 mg via INTRAVENOUS
  Filled 2017-06-28: qty 1

## 2017-06-28 MED ORDER — ACETAMINOPHEN 325 MG PO TABS
650.0000 mg | ORAL_TABLET | Freq: Once | ORAL | Status: AC | PRN
  Administered 2017-06-28: 650 mg via ORAL
  Filled 2017-06-28: qty 2

## 2017-06-28 MED ORDER — SODIUM CHLORIDE 0.9 % IV BOLUS (SEPSIS)
1000.0000 mL | Freq: Once | INTRAVENOUS | Status: AC
  Administered 2017-06-28: 1000 mL via INTRAVENOUS

## 2017-06-28 MED ORDER — METOCLOPRAMIDE HCL 5 MG/ML IJ SOLN
10.0000 mg | Freq: Once | INTRAMUSCULAR | Status: AC
  Administered 2017-06-28: 10 mg via INTRAVENOUS
  Filled 2017-06-28: qty 2

## 2017-06-28 NOTE — ED Triage Notes (Signed)
Pt reports sore throat and hoarseness for the last. Pt maintaining airway without difficulty.

## 2017-06-28 NOTE — ED Notes (Signed)
Patient is tearful and on the phone due to just receiving word she is pregnant. Will come back for blood work.

## 2017-06-28 NOTE — ED Notes (Signed)
Bed: WTR5 Expected date:  Expected time:  Means of arrival:  Comments: 
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
## 2017-06-28 NOTE — Discharge Instructions (Signed)
Please take Ibuprofen (Advil, motrin) and Tylenol (acetaminophen) to relieve your pain.  You may take up to 600 MG (3 pills) of normal strength ibuprofen every 8 hours as needed.  In between doses of ibuprofen you make take tylenol, up to 1,000 mg (two extra strength pills).  Do not take more than 3,000 mg tylenol in a 24 hour period.  Please check all medication labels as many medications such as pain and cold medications may contain tylenol.  Do not drink alcohol while taking these medications.  Do not take other NSAID'S while taking ibuprofen (such as aleve or naproxen).  Please take ibuprofen with food to decrease stomach upset.  Please make sure you are staying well hydrated.  If you worsen, develop fevers, are unable to eat or drink or have additional concerns please seek additional medical care.

## 2017-06-29 LAB — GC/CHLAMYDIA PROBE AMP (~~LOC~~) NOT AT ARMC
CHLAMYDIA, DNA PROBE: NEGATIVE
NEISSERIA GONORRHEA: NEGATIVE

## 2017-07-01 LAB — CULTURE, GROUP A STREP (THRC)

## 2017-08-01 ENCOUNTER — Encounter: Payer: Self-pay | Admitting: *Deleted

## 2017-09-05 ENCOUNTER — Encounter: Payer: Self-pay | Admitting: Neurology

## 2017-10-17 ENCOUNTER — Ambulatory Visit (HOSPITAL_COMMUNITY)
Admission: EM | Admit: 2017-10-17 | Discharge: 2017-10-17 | Disposition: A | Discharge status: Home / Self Care | Payer: Medicaid Other | Attending: Family Medicine | Admitting: Family Medicine

## 2017-10-17 ENCOUNTER — Encounter (HOSPITAL_COMMUNITY): Payer: Self-pay | Admitting: Emergency Medicine

## 2017-10-17 DIAGNOSIS — G43009 Migraine without aura, not intractable, without status migrainosus: Secondary | ICD-10-CM

## 2017-10-17 MED ORDER — METOCLOPRAMIDE HCL 5 MG/ML IJ SOLN
INTRAMUSCULAR | Status: AC
  Filled 2017-10-17: qty 2

## 2017-10-17 MED ORDER — KETOROLAC TROMETHAMINE 60 MG/2ML IM SOLN
60.0000 mg | Freq: Once | INTRAMUSCULAR | Status: AC
  Administered 2017-10-17: 60 mg via INTRAMUSCULAR

## 2017-10-17 MED ORDER — DEXAMETHASONE SODIUM PHOSPHATE 10 MG/ML IJ SOLN
INTRAMUSCULAR | Status: AC
  Filled 2017-10-17: qty 1

## 2017-10-17 MED ORDER — METOCLOPRAMIDE HCL 5 MG/ML IJ SOLN
5.0000 mg | Freq: Once | INTRAMUSCULAR | Status: AC
  Administered 2017-10-17: 5 mg via INTRAMUSCULAR

## 2017-10-17 MED ORDER — DEXAMETHASONE SODIUM PHOSPHATE 10 MG/ML IJ SOLN
10.0000 mg | Freq: Once | INTRAMUSCULAR | Status: AC
  Administered 2017-10-17: 10 mg via INTRAMUSCULAR

## 2017-10-17 MED ORDER — KETOROLAC TROMETHAMINE 60 MG/2ML IM SOLN
INTRAMUSCULAR | Status: AC
  Filled 2017-10-17: qty 2

## 2017-10-17 NOTE — Discharge Instructions (Signed)
Neurology appointment next month Toradol shot given in office Decadron shot given in office Zofran given in office Rest and drink plenty of fluids Use OTC medications as needed for symptomatic relief Follow up with PCP if symptoms persists Return or go to the ER if you have any new or worsening symptoms

## 2017-10-17 NOTE — ED Provider Notes (Signed)
[facility]   [id number] 10/17/17 Arrival Time: 1745  SUBJECTIVE:  PHILISHA WEINEL is a 27 y.o. female who complains of migraine that began last night.  Denies a precipitating event, or recent head trauma.  Patient localizes her pain to the behind her eyes and neck.  Describes the pain as constant and throbbing in character.  Patient has tried OTC ibuprofen and aleve without relief. Symptoms are made worse with light.  Reports similar symptoms in the past that improved with toradol, reglan, and decadron injections.  This is not the worst headache of their life.  Complains of photophobia.  Patient denies fever, chills, nausea, vomiting, aura, rhinorrhea, watery eyes, chest pain, SOB, abdominal pain, weakness, numbness or tingling.     ROS: As per HPI.  Past Medical History:  Diagnosis Date  . Anemia   . Migraine   . Ovarian cyst    Past Surgical History:  Procedure Laterality Date  . OOPHORECTOMY Right    No Known Allergies No current facility-administered medications on file prior to encounter.    Current Outpatient Medications on File Prior to Encounter  Medication Sig Dispense Refill  . ibuprofen (ADVIL,MOTRIN) 100 MG tablet Take 100 mg by mouth every 6 (six) hours as needed for fever.     Social History   Socioeconomic History  . Marital status: Single    Spouse name: Not on file  . Number of children: Not on file  . Years of education: Not on file  . Highest education level: Not on file  Occupational History  . Not on file  Social Needs  . Financial resource strain: Not on file  . Food insecurity:    Worry: Not on file    Inability: Not on file  . Transportation needs:    Medical: Not on file    Non-medical: Not on file  Tobacco Use  . Smoking status: Former Smoker    Packs/day: 0.50    Types: Cigarettes, Cigars  . Smokeless tobacco: Former Neurosurgeon  . Tobacco comment: quit with preg  Substance and Sexual Activity  . Alcohol use: No  . Drug use: No  .  Sexual activity: Yes    Birth control/protection: None  Lifestyle  . Physical activity:    Days per week: Not on file    Minutes per session: Not on file  . Stress: Not on file  Relationships  . Social connections:    Talks on phone: Not on file    Gets together: Not on file    Attends religious service: Not on file    Active member of club or organization: Not on file    Attends meetings of clubs or organizations: Not on file    Relationship status: Not on file  . Intimate partner violence:    Fear of current or ex partner: Not on file    Emotionally abused: Not on file    Physically abused: Not on file    Forced sexual activity: Not on file  Other Topics Concern  . Not on file  Social History Narrative  . Not on file   Family History  Problem Relation Age of Onset  . Hypertension Maternal Grandmother   . Cancer Maternal Grandfather        pancreatic    OBJECTIVE:  Vitals:   10/17/17 1809 10/17/17 1812  BP:  128/83  Pulse:  81  Resp:  16  Temp:  99.8 F (37.7 C)  TempSrc:  Oral  SpO2:  100%  Weight: 290 lb (131.5 kg)      General appearance: alert; no distress; appears uncomfortable sitting in dark room Eyes: PERRLA; EOMI HENT: normocephalic; atraumatic; mild frontal tenderness Neck: supple with FROM Lungs: clear to auscultation bilaterally Heart: regular rate and rhythm.  Radial pulses 2+ symmetrical bilaterally Extremities: no edema; symmetrical with no gross deformities Skin: warm and dry Neurologic: CN 2-12 grossly intact; Negative pronator drift; RAM without difficulty; normal gait; strength and sensation intact bilaterally about the upper and lower extremities Psychological: alert and cooperative; normal mood and affect  ASSESSMENT & PLAN:  1. Migraine without aura and without status migrainosus, not intractable     Meds ordered this encounter  Medications  . ketorolac (TORADOL) injection 60 mg  . metoCLOPramide (REGLAN) injection 5 mg  .  dexamethasone (DECADRON) injection 10 mg   Neurology appointment next month Toradol shot given in office Decadron shot given in office Zofran given in office Rest and drink plenty of fluids Use OTC medications as needed for symptomatic relief Follow up with PCP if symptoms persists Return or go to the ER if you have any new or worsening symptoms  Reviewed expectations re: course of current medical issues. Questions answered. Outlined signs and symptoms indicating need for more acute intervention. Patient verbalized understanding. After Visit Summary given.   Rennis HardingWurst, Devonne Lalani, PA-C 10/17/17 1841

## 2017-10-17 NOTE — ED Triage Notes (Signed)
PT reports a headache that started last night. PT took ibuprofen and tylenol this AM and it did not improve. PT reports sensitivity to light for the first time with a headache.

## 2017-10-19 ENCOUNTER — Ambulatory Visit (HOSPITAL_COMMUNITY)
Admission: EM | Admit: 2017-10-19 | Discharge: 2017-10-19 | Disposition: A | Discharge status: Home / Self Care | Payer: Medicaid Other | Attending: Family Medicine | Admitting: Family Medicine

## 2017-10-19 ENCOUNTER — Encounter (HOSPITAL_COMMUNITY): Payer: Self-pay | Admitting: Emergency Medicine

## 2017-10-19 ENCOUNTER — Emergency Department (HOSPITAL_COMMUNITY)
Admission: EM | Admit: 2017-10-19 | Discharge: 2017-10-19 | Disposition: A | Discharge status: Home / Self Care | Payer: Medicaid Other | Attending: Emergency Medicine | Admitting: Emergency Medicine

## 2017-10-19 ENCOUNTER — Emergency Department (HOSPITAL_COMMUNITY): Payer: Medicaid Other

## 2017-10-19 ENCOUNTER — Other Ambulatory Visit: Payer: Self-pay

## 2017-10-19 ENCOUNTER — Encounter (HOSPITAL_COMMUNITY): Payer: Self-pay

## 2017-10-19 DIAGNOSIS — R519 Headache, unspecified: Secondary | ICD-10-CM

## 2017-10-19 DIAGNOSIS — R51 Headache: Secondary | ICD-10-CM

## 2017-10-19 DIAGNOSIS — Z87891 Personal history of nicotine dependence: Secondary | ICD-10-CM | POA: Insufficient documentation

## 2017-10-19 DIAGNOSIS — Z79899 Other long term (current) drug therapy: Secondary | ICD-10-CM | POA: Insufficient documentation

## 2017-10-19 MED ORDER — DIPHENHYDRAMINE HCL 50 MG/ML IJ SOLN
25.0000 mg | Freq: Once | INTRAMUSCULAR | Status: AC
  Administered 2017-10-19: 25 mg via INTRAVENOUS
  Filled 2017-10-19: qty 1

## 2017-10-19 MED ORDER — METOCLOPRAMIDE HCL 5 MG/ML IJ SOLN
10.0000 mg | Freq: Once | INTRAMUSCULAR | Status: AC
  Administered 2017-10-19: 10 mg via INTRAVENOUS
  Filled 2017-10-19: qty 2

## 2017-10-19 MED ORDER — SODIUM CHLORIDE 0.9 % IV BOLUS
1000.0000 mL | Freq: Once | INTRAVENOUS | Status: AC
Start: 2017-10-19 — End: 2017-10-19
  Administered 2017-10-19: 1000 mL via INTRAVENOUS

## 2017-10-19 NOTE — ED Provider Notes (Signed)
[facility]    CSN: [id number] Arrival date & time: 10/19/17  1478     History   Chief Complaint Chief Complaint  Patient presents with  . Headache    HPI Cynthia Horne is a 27 y.o. female.   27 year old female with history of migraines, daily headache, anemia comes in for acute onset of headache this morning. She was seen here 2 days ago for migraine and improved with Toradol, Regland, decadron injection. States she woke up this morning, and was photophobic when this and hit her eyes.  When she sat up, felt pain to the whole head, neck, shoulder.  States she usually wakes up with a headache, and occasionally wakes up with migraines, and this is the worst headache in her life.  She has pounding pain behind her eyes, and tightness to the neck and shoulders.  States if she keeps her head aligned to the neck, headache is not as bad, but worsening headache with head movement.  States painful neck movement.  She has photophobia and phonophobia.  Nausea without vomiting.  She denies any head injury/trauma.  Denies any strenuous activity.  Denies URI symptoms such as cough, congestion, sore throat.  Denies fever, chills, night sweats.  States she was able to drive herself to the office, though with difficulty due to photophobia.  When she first stood up from the car, she had lightheadedness, but then was able to walk into the building on her own.  She denies dizziness, weakness.  Denies blurry vision.  Denies alcohol use.  Former smoker.  She took Zyrtec and ibuprofen last night as she does have seasonal allergies.  States she takes ibuprofen to try and prevent headaches in the morning. She has an neurology appointment 7/16     Past Medical History:  Diagnosis Date  . Anemia   . Migraine   . Ovarian cyst     Patient Active Problem List   Diagnosis Date Noted  . Migraine headache with aura 11/05/2015    Past Surgical History:  Procedure Laterality Date  . OOPHORECTOMY Right       OB History    Gravida  2   Para  1   Term  1   Preterm      AB      Living  1     SAB      TAB      Ectopic      Multiple  0   Live Births  1            Home Medications    Prior to Admission medications   Medication Sig Start Date End Date Taking? Authorizing Provider  cetirizine (ZYRTEC) 10 MG tablet Take 10 mg by mouth daily.   Yes [provider]  ibuprofen (ADVIL,MOTRIN) 100 MG tablet Take 100 mg by mouth every 6 (six) hours as needed for fever.   Yes [provider]    Family History Family History  Problem Relation Age of Onset  . Hypertension Maternal Grandmother   . Cancer Maternal Grandfather        pancreatic    Social History Social History   Tobacco Use  . Smoking status: Former Smoker    Packs/day: 0.50    Types: Cigarettes, Cigars  . Smokeless tobacco: Former Neurosurgeon  . Tobacco comment: quit with preg  Substance Use Topics  . Alcohol use: No  . Drug use: No     Allergies   Patient has  no known allergies.   Review of Systems Review of Systems  Reason unable to perform ROS: See HPI as above.     Physical Exam Triage Vital Signs ED Triage Vitals  Enc Vitals Group     BP 10/19/17 1021 123/80     Pulse Rate 10/19/17 1021 76     Resp 10/19/17 1021 18     Temp 10/19/17 1021 98.7 F (37.1 C)     Temp Source 10/19/17 1021 Oral     SpO2 10/19/17 1021 100 %     Weight --      Height --      Head Circumference --      Peak Flow --      Pain Score 10/19/17 1026 10     Pain Loc --      Pain Edu? --      Excl. in GC? --    No data found.  Updated Vital Signs BP 123/80 (BP Location: Left Arm)   Pulse 76   Temp 98.7 F (37.1 C) (Oral)   Resp 18   LMP 10/08/2017   SpO2 100%   Physical Exam  Constitutional: She is oriented to person, place, and time. She appears well-developed and well-nourished.  In mild distress due to pain  HENT:  Head: Normocephalic and atraumatic.  Right Ear: Tympanic  membrane, external ear and ear canal normal. Tympanic membrane is not erythematous and not bulging.  Left Ear: Tympanic membrane, external ear and ear canal normal. Tympanic membrane is not erythematous and not bulging.  Nose: Right sinus exhibits maxillary sinus tenderness. Right sinus exhibits no frontal sinus tenderness. Left sinus exhibits maxillary sinus tenderness and frontal sinus tenderness.  Mouth/Throat: Uvula is midline, oropharynx is clear and moist and mucous membranes are normal.  Eyes: Conjunctivae and EOM are normal.  Unable to assess pupils due to photophobia.   Neck: Normal range of motion. Neck supple.  Tenderness to palpation of bilateral neck.  No tenderness to palpation of spinous processes.  Although with painful movement, patient has full range of motion.  Able to put chin to chest.  Cardiovascular: Normal rate, regular rhythm and normal heart sounds. Exam reveals no gallop and no friction rub.  No murmur heard. Pulmonary/Chest: Effort normal and breath sounds normal. She has no decreased breath sounds. She has no wheezes. She has no rhonchi. She has no rales.  Lymphadenopathy:    She has no cervical adenopathy.  Neurological: She is alert and oriented to person, place, and time. She is not disoriented. Coordination and gait normal. GCS eye subscore is 4. GCS verbal subscore is 5. GCS motor subscore is 6.  Skin: Skin is warm and dry.  Psychiatric: She has a normal mood and affect. Her behavior is normal. Judgment normal.   UC Treatments / Results  Labs (all labs ordered are listed, but only abnormal results are displayed) Labs Reviewed - No data to display  EKG None  Radiology No results found.  Procedures Procedures (including critical care time)  Medications Ordered in UC Medications - No data to display  Initial Impression / Assessment and Plan / UC Course  I have reviewed the triage vital signs and the nursing notes.  Pertinent labs & imaging results  that were available during my care of the patient were reviewed by me and considered in my medical decision making (see chart for details).    Discussed case with Dr. Tracie Harrier.  27 year old female with history of migraine and daily headaches  comes in for acute onset of headache that she states to be worse headache in her life.  Patient was seen here 2 days ago for headache that was treated with Toradol, Reglan, Decadron with good improvement.  States current  headache also came with neck pain and shoulder pain.  She has painful neck movement, but full range of motion.  She is afebrile, without tachycardia or tachypnea.  She does not look comfortable, but  is nontoxic in appearance. She has a neurology appointment 7/16. Discussed options of another trial of headache cocktail with treatment for sinus pressure vs emergency department. Patient would like to go to the emergency department for further evaluation of worse headache in her life. Patient discharged in stable condition to the emergency department for further evaluation needed.  Final Clinical Impressions(s) / UC Diagnoses   Final diagnoses:  Acute nonintractable headache, unspecified headache type    ED Prescriptions    None        Belinda FisherYu, Rheba Diamond V, PA-C 10/19/17 1111

## 2017-10-19 NOTE — ED Provider Notes (Signed)
[facility] EMERGENCY DEPARTMENT Provider Note   CSN: [id number] Arrival date & time: 10/19/17  1121     History   Chief Complaint No chief complaint on file.   HPI Cynthia Horne is a 27 y.o. female.  HPI  27 year old female presents with severe headache.  She has a long-standing history of migraines.  She states 3 years ago she had a migraine that was the severe but usually the migraines are less severe.  She had a migraine headache 2 days ago and went to urgent care and it went away after treatment.  Was fine yesterday but she when she woke up today she has been having a severe headache.  It feels like her typical migraines but is stronger.  She states she wakes up with a daily headache but today as soon she opened her eyes the headache was severe.  When she closes her eyes it improves but does not go away.  She does not have vomiting or fevers.  She states the headache is behind her eyes and otherwise typical of migraines.  She went to urgent care but due to her stating that this headache was 1 of the worst she is ever had, they told her to come to the ER to have a head CT.  No weakness or numbness.  She states her whole body feels stiff from her head to her pelvis. Took ibuprofen this AM.  Symptoms started around 8:30 AM this morning.  Past Medical History:  Diagnosis Date  . Anemia   . Migraine   . Ovarian cyst     Patient Active Problem List   Diagnosis Date Noted  . Migraine headache with aura 11/05/2015    Past Surgical History:  Procedure Laterality Date  . OOPHORECTOMY Right      OB History    Gravida  2   Para  1   Term  1   Preterm      AB      Living  1     SAB      TAB      Ectopic      Multiple  0   Live Births  1            Home Medications    Prior to Admission medications   Medication Sig Start Date End Date Taking? Authorizing Provider  cetirizine (ZYRTEC) 10 MG tablet Take 10 mg by mouth daily.    [provider]  ibuprofen (ADVIL,MOTRIN) 100 MG tablet Take 100 mg by mouth every 6 (six) hours as needed for fever.    [provider]    Family History Family History  Problem Relation Age of Onset  . Hypertension Maternal Grandmother   . Cancer Maternal Grandfather        pancreatic    Social History Social History   Tobacco Use  . Smoking status: Former Smoker    Packs/day: 0.50    Types: Cigarettes, Cigars  . Smokeless tobacco: Former Neurosurgeon  . Tobacco comment: quit with preg  Substance Use Topics  . Alcohol use: No  . Drug use: No     Allergies   Patient has no known allergies.   Review of Systems Review of Systems  Constitutional: Negative for fever.  Eyes: Positive for photophobia.  Gastrointestinal: Negative for vomiting.  Musculoskeletal: Positive for myalgias.  Neurological: Positive for headaches. Negative for weakness and numbness.  All other systems reviewed and are negative.  Physical Exam Updated Vital Signs BP 120/80   Pulse 75   Temp 98.1 F (36.7 C) (Oral)   Resp 18   LMP 10/08/2017   SpO2 100%   Physical Exam  Constitutional: She is oriented to person, place, and time. She appears well-developed and well-nourished. No distress.  HENT:  Head: Normocephalic and atraumatic.  Right Ear: External ear normal.  Left Ear: External ear normal.  Nose: Nose normal.  Eyes: EOM are normal. Right eye exhibits no discharge. Left eye exhibits no discharge.  Pupil testing limited due to severe photophobia  Neck: Normal range of motion. Neck supple.  Cardiovascular: Normal rate, regular rhythm and normal heart sounds.  Pulmonary/Chest: Effort normal and breath sounds normal.  Abdominal: Soft. She exhibits no distension. There is no tenderness.  Neurological: She is alert and oriented to person, place, and time.  CN 3-12 grossly intact. 5/5 strength in all 4 extremities. Grossly normal sensation. Normal finger to nose.   Skin: Skin is warm  and dry. She is not diaphoretic.  Nursing note and vitals reviewed.    ED Treatments / Results  Labs (all labs ordered are listed, but only abnormal results are displayed) Labs Reviewed - No data to display  EKG None  Radiology Ct Head Wo Contrast  Result Date: 10/19/2017 CLINICAL DATA:  Headache. EXAM: CT HEAD WITHOUT CONTRAST TECHNIQUE: Contiguous axial images were obtained from the base of the skull through the vertex without intravenous contrast. COMPARISON:  CT scan of November 29, 2015. FINDINGS: Brain: No evidence of acute infarction, hemorrhage, hydrocephalus, extra-axial collection or mass lesion/mass effect. Vascular: No hyperdense vessel or unexpected calcification. Skull: Normal. Negative for fracture or focal lesion. Sinuses/Orbits: No acute finding. Other: None. IMPRESSION: Normal head CT. Electronically Signed   By: Lupita RaiderJames  Green Jr, M.D.   On: 10/19/2017 13:27    Procedures Procedures (including critical care time)  Medications Ordered in ED Medications  metoCLOPramide (REGLAN) injection 10 mg (10 mg Intravenous Given 10/19/17 1325)  diphenhydrAMINE (BENADRYL) injection 25 mg (25 mg Intravenous Given 10/19/17 1323)  sodium chloride 0.9 % bolus 1,000 mL (0 mLs Intravenous Stopped 10/19/17 1443)     Initial Impression / Assessment and Plan / ED Course  I have reviewed the triage vital signs and the nursing notes.  Pertinent labs & imaging results that were available during my care of the patient were reviewed by me and considered in my medical decision making (see chart for details).     Patient is asking for and states she was sent for a head CT.  I counseled on potential risks of head CT, especially radiation to the brain.  Family however is concerned about a head bleed/aneurysm.  I think the severity of headache is new for the patient but the headache otherwise feels like her typical migraines.  CT head was obtained and is negative.  Given this was obtained within 6  hours of her onset of symptoms I highly doubt subarachnoid hemorrhage.  She has had migraines this severe before.  No fevers to suggest meningitis or other acute CNS infection.  I do not think LP is necessary.  She is feeling significant Nedra HaiLee better after Reglan/Benadryl/fluids and would like to go home.  Return precautions.  She has neuro follow-up on July 16  Final Clinical Impressions(s) / ED Diagnoses   Final diagnoses:  Severe headache    ED Discharge Orders    None       Pricilla LovelessGoldston, Agueda Houpt, MD 10/19/17 1524

## 2017-10-19 NOTE — ED Notes (Signed)
Pt states had a h/ a a few dfays agho and was given steroids and meds and it helped

## 2017-10-19 NOTE — ED Triage Notes (Addendum)
Headache involving entire head.  Patient is light sensitive.  Patient feels dizzy and throbbing, pressure pain.  Pain with attempts to put chin to chest. Seen 6/24 for headache and treated and it did go away.  But pain has reoccurred and is extending into neck and shoulders.    Patient has an appt with neurology for July 16

## 2017-10-19 NOTE — Discharge Instructions (Addendum)
If your headache recurs or worsens or you develop vomiting, fever, blurry vision, confusion, or any other new/concerning symptoms and return to the ER for evaluation.  Otherwise follow-up with the neurologist as scheduled.

## 2017-10-19 NOTE — Discharge Instructions (Signed)
Given your complaint of "worse headache in your life" and painful head movement, please go to the emergency department for further evaluation.

## 2017-10-19 NOTE — ED Triage Notes (Signed)
Patient complains of recurrent headache after being seen at [facility] on Tuesday. Yesterday no pain and this am awoke with head pain and photophobia with nausea, NAD. Alert and oriented

## 2017-11-08 ENCOUNTER — Other Ambulatory Visit: Payer: Self-pay

## 2017-11-08 ENCOUNTER — Encounter: Payer: Self-pay | Admitting: Neurology

## 2017-11-08 ENCOUNTER — Ambulatory Visit: Payer: Medicaid Other | Admitting: Neurology

## 2017-11-08 VITALS — BP 108/68 | HR 97 | Ht 63.0 in | Wt 292.0 lb

## 2017-11-08 DIAGNOSIS — G43009 Migraine without aura, not intractable, without status migrainosus: Secondary | ICD-10-CM | POA: Diagnosis not present

## 2017-11-08 DIAGNOSIS — R51 Headache: Secondary | ICD-10-CM

## 2017-11-08 DIAGNOSIS — R519 Headache, unspecified: Secondary | ICD-10-CM

## 2017-11-08 NOTE — Progress Notes (Signed)
NEUROLOGY CONSULTATION NOTE  Cynthia Horne MRN: [id number] DOB: 02-Aug-1990  Referring provider: Catalina Pizzaynthia Warden, NP Primary care provider: Catalina Pizzaynthia Warden, NP  Reason for consult:  headache  HISTORY OF PRESENT ILLNESS: Cynthia Horne is a 27 year old right-handed female who presents for migraines.  She is accompanied by her mother who supplements history.   History supplemented by ED and referring provider's notes.  Onset:  Since teenager.  They resolved after she had her daughter in 2017, however she had a recurrence of two severe migraines last month (6/24 and 6/26) requiring ED visits.  Possible trigger included caffeine withdrawal (she discontinued drinking coffee in the morning).  For many years, she has had daily diffuse mild to moderate non-throbbing headache.  As for the migraines: Location:  Bilateral retro-orbital/frontal/temporal, radiates into the neck and shoulders Quality:  pounding Intensity:  severe.  She denies new headache, thunderclap headache. Aura:  no Prodrome:  no Postdrome:  no Associated symptoms:  Nausea, photophobia, phonophobia.  She denies associated visual disturbance, vomiting, unilateral numbness or weakness. Duration:  1 day (2 hours with naproxen) Frequency:  2 migraines last month.   Frequency of abortive medication: she was taking  Triggers/exacerbating factors:  stress Relieving factors:  rest Activity:  aggravates  CT head without contrast from 10/19/17 was personally reviewed and is normal.  Current NSAIDS:  Naproxen 500mg  Current analgesics:  no Current triptans:  no Current anti-emetic:  no Current muscle relaxants:  no Current anti-anxiolytic:  no Current sleep aide:  no Current Antihypertensive medications:  no Current Antidepressant medications:  no Current Anticonvulsant medications:  no Current Vitamins/Herbal/Supplements:  no Current Antihistamines/Decongestants:  no Other therapy:  no  Past NSAIDS:  ibuprofen Past  analgesics:  Excedrin Migraine, Fioricet, BC Past abortive triptans:  sumatriptan 50mg  Past muscle relaxants:  Robaxin Past anti-emetic:  Reglan 10mg  Past antihypertensive medications:  no Past antidepressant medications:  no Past anticonvulsant medications:  no Past vitamins/Herbal/Supplements:  no Past antihistamines/decongestants:  no Other past therapies:  no  Caffeine:  soda Alcohol:  no Smoker:  no Diet:  hydrates Exercise:  no Depression:  no; Anxiety:  yes Other pain:  no Sleep hygiene:  varies Family history of headache:  Maternal grandmother  BMP from 06/28/17 showed Na 137, K 3.4, glucose 104, BUN less than 5, Cr 0.56  PAST MEDICAL HISTORY: Past Medical History:  Diagnosis Date  . Anemia   . Migraine   . Ovarian cyst     PAST SURGICAL HISTORY: Past Surgical History:  Procedure Laterality Date  . OOPHORECTOMY Right     MEDICATIONS: Current Outpatient Medications on File Prior to Visit  Medication Sig Dispense Refill  . cetirizine (ZYRTEC) 10 MG tablet Take 10 mg by mouth daily.    Marland Kitchen. ibuprofen (ADVIL,MOTRIN) 100 MG tablet Take 100 mg by mouth every 6 (six) hours as needed for fever.     No current facility-administered medications on file prior to visit.     ALLERGIES: No Known Allergies  FAMILY HISTORY: Family History  Problem Relation Age of Onset  . Hypertension Maternal Grandmother   . Cancer Maternal Grandfather        pancreatic    SOCIAL HISTORY: Social History   Socioeconomic History  . Marital status: Single    Spouse name: Not on file  . Number of children: 1  . Years of education: Not on file  . Highest education level: Some college, no degree  Occupational History  . Occupation: Company secretaryWarehouse worker  Social Needs  . Financial resource strain: Not on file  . Food insecurity:    Worry: Not on file    Inability: Not on file  . Transportation needs:    Medical: Not on file    Non-medical: Not on file  Tobacco Use  . Smoking  status: Former Smoker    Packs/day: 0.50    Types: Cigarettes, Cigars  . Smokeless tobacco: Former Neurosurgeon  . Tobacco comment: quit with preg  Substance and Sexual Activity  . Alcohol use: No  . Drug use: No  . Sexual activity: Yes    Birth control/protection: None  Lifestyle  . Physical activity:    Days per week: Not on file    Minutes per session: Not on file  . Stress: Not on file  Relationships  . Social connections:    Talks on phone: Not on file    Gets together: Not on file    Attends religious service: Not on file    Active member of club or organization: Not on file    Attends meetings of clubs or organizations: Not on file    Relationship status: Not on file  . Intimate partner violence:    Fear of current or ex partner: Not on file    Emotionally abused: Not on file    Physically abused: Not on file    Forced sexual activity: Not on file  Other Topics Concern  . Not on file  Social History Narrative   Patient is right-handed.She lives in a one story house. Seh drinks 2 cups of coffee a day, and a glass of tea or soda  QOD. She walks extensively at work.    REVIEW OF SYSTEMS: Constitutional: No fevers, chills, or sweats, no generalized fatigue, change in appetite Eyes: No visual changes, double vision, eye pain Ear, nose and throat: No hearing loss, ear pain, nasal congestion, sore throat Cardiovascular: No chest pain, palpitations Respiratory:  No shortness of breath at rest or with exertion, wheezes GastrointestinaI: No nausea, vomiting, diarrhea, abdominal pain, fecal incontinence Genitourinary:  No dysuria, urinary retention or frequency Musculoskeletal:  No neck pain, back pain Integumentary: No rash, pruritus, skin lesions Neurological: as above Psychiatric: No depression, insomnia, anxiety Endocrine: No palpitations, fatigue, diaphoresis, mood swings, change in appetite, change in weight, increased thirst Hematologic/Lymphatic:  No purpura,  petechiae. Allergic/Immunologic: no itchy/runny eyes, nasal congestion, recent allergic reactions, rashes  PHYSICAL EXAM: Vitals:   11/08/17 1117  BP: 108/68  Pulse: 97  SpO2: 98%   General: No acute distress.  Patient appears well-groomed.  Head:  Normocephalic/atraumatic Eyes:  fundi examined but not visualized Neck: supple, no paraspinal tenderness, full range of motion Back: No paraspinal tenderness Heart: regular rate and rhythm Lungs: Clear to auscultation bilaterally. Vascular: No carotid bruits. Neurological Exam: Mental status: alert and oriented to person, place, and time, recent and remote memory intact, fund of knowledge intact, attention and concentration intact, speech fluent and not dysarthric, language intact. Cranial nerves: CN I: not tested CN II: pupils equal, round and reactive to light, visual fields intact CN III, IV, VI:  full range of motion, no nystagmus, no ptosis CN V: facial sensation intact CN VII: upper and lower face symmetric CN VIII: hearing intact CN IX, X: gag intact, uvula midline CN XI: sternocleidomastoid and trapezius muscles intact CN XII: tongue midline Bulk & Tone: normal, no fasciculations. Motor:  5/5 throughout  Sensation: temperature and vibration sensation intact. Deep Tendon Reflexes:  2+ throughout, toes downgoing.  Finger to nose testing:  Without dysmetria.  Heel to shin:  Without dysmetria.  Gait:  Normal station and stride.  Romberg negative.  IMPRESSION: 1.  Chronic daily headache, possibly secondary to medication overuse.  She traditionally would take ibuprofen 5 days a week for years.  More recently, she has stopped ibuprofen and instead limited naproxen to no more than once a week. 2.  Migraine without aura, without status migrainosus, not intractable.  2 migraines over the course of 2 days last month, possibly triggered by caffeine withdrawal. 3.  Morbid obesity (BMI 51.73 kg/m2)  PLAN: 1.  We discussed daily  preventatives, such as beta blocker, antidepressants or antiepileptic medications.  At this time, she defers starting a medication and wants to work on lifestyle modification first.  If she changes her mind, she will contact me and we will start a preventative and will have her follow up. 2.  Limit naproxen to no more than 2 days out of week to prevent rebound headache 3.  Keep headache diary 4.  Stop all caffeine, exercise, increase water, stop soda, sleep hygiene, diet and weight loss 5.  Consider magnesium citrate, B2 and CoQ10 6.  Recommend formal eye exam.  Thank you for allowing me to take part in the care of this patient.  Shon Millet, DO  CC:  Cynthia Pizza, NP

## 2017-11-08 NOTE — Patient Instructions (Signed)
Migraine Recommendations: 1.  We will hold off on starting a daily preventative medication.  If you wish to start one, then contact me 2.  Take naproxen 500mg  every 12 hours as needed for headache, but limit to no more than 2 days out of week. 3.  Limit use of pain relievers to no more than 2 days out of the week.  These medications include acetaminophen, ibuprofen, triptans and narcotics.  This will help reduce risk of rebound headaches. 4.  Be aware of common food triggers such as processed sweets, processed foods with nitrites (such as deli meat, hot dogs, sausages), foods with MSG, alcohol (such as wine), chocolate, certain cheeses, certain fruits (dried fruits, bananas, some citrus fruit), vinegar, diet soda. 4.  Avoid caffeine 5.  Routine exercise 6.  Proper sleep hygiene 7.  Stay adequately hydrated with water 8.  Keep a headache diary. 9.  Maintain proper stress management. 10.  Do not skip meals. 11.  Consider supplements:  Magnesium citrate 400mg  to 600mg  daily, riboflavin 400mg , Coenzyme Q 10 100mg  three times daily 12.  Please see the eye doctor for an eye exam.   Migraine Headache A migraine headache is a very strong throbbing pain on one side or both sides of your head. Migraines can also cause other symptoms. Talk with your doctor about what things may bring on (trigger) your migraine headaches. Follow these instructions at home: Medicines  Take over-the-counter and prescription medicines only as told by your doctor.  Do not drive or use heavy machinery while taking prescription pain medicine.  To prevent or treat constipation while you are taking prescription pain medicine, your doctor may recommend that you: ? Drink enough fluid to keep your pee (urine) clear or pale yellow. ? Take over-the-counter or prescription medicines. ? Eat foods that are high in fiber. These include fresh fruits and vegetables, whole grains, and beans. ? Limit foods that are high in fat and  processed sugars. These include fried and sweet foods. Lifestyle  Avoid alcohol.  Do not use any products that contain nicotine or tobacco, such as cigarettes and e-cigarettes. If you need help quitting, ask your doctor.  Get at least 8 hours of sleep every night.  Limit your stress. General instructions   Keep a journal to find out what may bring on your migraines. For example, write down: ? What you eat and drink. ? How much sleep you get. ? Any change in what you eat or drink. ? Any change in your medicines.  If you have a migraine: ? Avoid things that make your symptoms worse, such as bright lights. ? It may help to lie down in a dark, quiet room. ? Do not drive or use heavy machinery. ? Ask your doctor what activities are safe for you.  Keep all follow-up visits as told by your doctor. This is important. Contact a doctor if:  You get a migraine that is different or worse than your usual migraines. Get help right away if:  Your migraine gets very bad.  You have a fever.  You have a stiff neck.  You have trouble seeing.  Your muscles feel weak or like you cannot control them.  You start to lose your balance a lot.  You start to have trouble walking.  You pass out (faint). This information is not intended to replace advice given to you by your health care provider. Make sure you discuss any questions you have with your health care provider. Document Released:  01/20/2008 Document Revised: 10/31/2015 Document Reviewed: 09/29/2015 Elsevier Interactive Patient Education  [employer]2018 Elsevier Inc.

## 2018-10-02 ENCOUNTER — Emergency Department (HOSPITAL_COMMUNITY)
Admission: EM | Admit: 2018-10-02 | Discharge: 2018-10-03 | Disposition: A | Discharge status: Home / Self Care | Payer: Medicaid Other | Attending: Emergency Medicine | Admitting: Emergency Medicine

## 2018-10-02 ENCOUNTER — Emergency Department (HOSPITAL_COMMUNITY): Payer: Medicaid Other

## 2018-10-02 ENCOUNTER — Encounter (HOSPITAL_COMMUNITY): Payer: Self-pay

## 2018-10-02 ENCOUNTER — Other Ambulatory Visit: Payer: Self-pay

## 2018-10-02 DIAGNOSIS — Z87891 Personal history of nicotine dependence: Secondary | ICD-10-CM | POA: Insufficient documentation

## 2018-10-02 DIAGNOSIS — Y9389 Activity, other specified: Secondary | ICD-10-CM | POA: Diagnosis not present

## 2018-10-02 DIAGNOSIS — R51 Headache: Secondary | ICD-10-CM | POA: Diagnosis not present

## 2018-10-02 DIAGNOSIS — S0083XA Contusion of other part of head, initial encounter: Secondary | ICD-10-CM | POA: Insufficient documentation

## 2018-10-02 DIAGNOSIS — Y999 Unspecified external cause status: Secondary | ICD-10-CM | POA: Insufficient documentation

## 2018-10-02 DIAGNOSIS — M542 Cervicalgia: Secondary | ICD-10-CM | POA: Diagnosis not present

## 2018-10-02 DIAGNOSIS — Z79899 Other long term (current) drug therapy: Secondary | ICD-10-CM | POA: Diagnosis not present

## 2018-10-02 DIAGNOSIS — Y92410 Unspecified street and highway as the place of occurrence of the external cause: Secondary | ICD-10-CM | POA: Insufficient documentation

## 2018-10-02 DIAGNOSIS — S0993XA Unspecified injury of face, initial encounter: Secondary | ICD-10-CM | POA: Diagnosis present

## 2018-10-02 DIAGNOSIS — R519 Headache, unspecified: Secondary | ICD-10-CM

## 2018-10-02 LAB — POC URINE PREG, ED: Preg Test, Ur: NEGATIVE

## 2018-10-02 MED ORDER — METOCLOPRAMIDE HCL 5 MG/ML IJ SOLN
10.0000 mg | Freq: Once | INTRAMUSCULAR | Status: AC
  Administered 2018-10-02: 10 mg via INTRAVENOUS
  Filled 2018-10-02: qty 2

## 2018-10-02 MED ORDER — KETOROLAC TROMETHAMINE 15 MG/ML IJ SOLN
15.0000 mg | Freq: Once | INTRAMUSCULAR | Status: AC
  Administered 2018-10-02: 15 mg via INTRAVENOUS
  Filled 2018-10-02: qty 1
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
## 2018-10-02 NOTE — ED Notes (Signed)
Bed: WTR5 Expected date:  Expected time:  Means of arrival:  Comments: 

## 2018-10-03 MED ORDER — METHOCARBAMOL 500 MG PO TABS: 500.0000 mg | ORAL_TABLET | Freq: Two times a day (BID) | ORAL | 0 refills | Status: DC

## 2020-05-18 ENCOUNTER — Other Ambulatory Visit: Payer: Self-pay

## 2020-05-18 ENCOUNTER — Encounter (HOSPITAL_COMMUNITY): Payer: Self-pay

## 2020-05-18 ENCOUNTER — Inpatient Hospital Stay (HOSPITAL_COMMUNITY)
Admission: AD | Admit: 2020-05-18 | Discharge: 2020-05-18 | Disposition: A | Discharge status: Home / Self Care | Payer: Medicaid Other | Attending: Obstetrics and Gynecology | Admitting: Obstetrics and Gynecology

## 2020-05-18 DIAGNOSIS — O210 Mild hyperemesis gravidarum: Secondary | ICD-10-CM | POA: Diagnosis not present

## 2020-05-18 DIAGNOSIS — R519 Headache, unspecified: Secondary | ICD-10-CM | POA: Diagnosis not present

## 2020-05-18 DIAGNOSIS — R109 Unspecified abdominal pain: Secondary | ICD-10-CM | POA: Insufficient documentation

## 2020-05-18 DIAGNOSIS — Z3A01 Less than 8 weeks gestation of pregnancy: Secondary | ICD-10-CM | POA: Diagnosis not present

## 2020-05-18 DIAGNOSIS — Z87891 Personal history of nicotine dependence: Secondary | ICD-10-CM | POA: Diagnosis not present

## 2020-05-18 DIAGNOSIS — Z3A08 8 weeks gestation of pregnancy: Secondary | ICD-10-CM | POA: Diagnosis not present

## 2020-05-18 DIAGNOSIS — O26891 Other specified pregnancy related conditions, first trimester: Secondary | ICD-10-CM | POA: Diagnosis present

## 2020-05-18 LAB — URINALYSIS, ROUTINE W REFLEX MICROSCOPIC
Bilirubin Urine: NEGATIVE
Glucose, UA: NEGATIVE mg/dL
Hgb urine dipstick: NEGATIVE
Ketones, ur: NEGATIVE mg/dL
Leukocytes,Ua: NEGATIVE
Nitrite: NEGATIVE
Protein, ur: NEGATIVE mg/dL
Specific Gravity, Urine: 1.01 (ref 1.005–1.030)
pH: 8 (ref 5.0–8.0)

## 2020-05-18 LAB — POCT PREGNANCY, URINE: Preg Test, Ur: POSITIVE — AB

## 2020-05-18 MED ORDER — ONDANSETRON 8 MG PO TBDP: 8.0000 mg | ORAL_TABLET | Freq: Three times a day (TID) | ORAL | 0 refills | Status: DC | PRN

## 2020-05-18 MED ORDER — ONDANSETRON 4 MG PO TBDP
8.0000 mg | ORAL_TABLET | Freq: Once | ORAL | Status: AC
  Administered 2020-05-18: 8 mg via ORAL
  Filled 2020-05-18: qty 2

## 2020-05-18 MED ORDER — BUTALBITAL-APAP-CAFFEINE 50-325-40 MG PO TABS: 1.0000 | ORAL_TABLET | Freq: Four times a day (QID) | ORAL | 0 refills | Status: DC | PRN

## 2020-05-18 MED ORDER — BUTALBITAL-APAP-CAFFEINE 50-325-40 MG PO TABS
2.0000 | ORAL_TABLET | ORAL | Status: DC | PRN
  Administered 2020-05-18: 2 via ORAL
  Filled 2020-05-18: qty 2
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
## 2020-05-18 NOTE — MAU Provider Note (Signed)
Patient Cynthia Horne is a 30 y.o. G2P1001  at [redacted]w[redacted]d here with complaints of nausea and vomiting and a headache. She denies vaginal bleeding, vaginal discharge, pelvic pain. She reports a headache for 4 days; denies fever, SOB, chills, body aches. She has a history of migraines. She denies URI symptoms, congestion.   She reports doing well with her hydration; even while vomiting food she is able to tolerate drinking water.  History     CSN: [id number]  Arrival date and time: 05/18/20 0945   None     Chief Complaint  Patient presents with  . Abdominal Pain  . Emesis  . Nausea   Emesis  This is a new problem. The current episode started in the past 7 days. The problem occurs 2 to 4 times per day. The problem has been unchanged. The emesis has an appearance of stomach contents and bile. There has been no fever. Associated symptoms include headaches. Pertinent negatives include no coughing or fever.  Headache  This is a new problem. The current episode started in the past 7 days. The problem occurs constantly. The pain is located in the retro-orbital region. The pain does not radiate. The pain quality is similar to prior headaches. The quality of the pain is described as aching. Associated symptoms include nausea, photophobia and vomiting. Pertinent negatives include no blurred vision, coughing, fever, loss of balance, rhinorrhea, scalp tenderness, sore throat or swollen glands. The symptoms are aggravated by bright light. She has tried nothing for the symptoms.    OB History    Gravida  2   Para  1   Term  1   Preterm      AB      Living  1     SAB      IAB      Ectopic      Multiple  0   Live Births  1           Past Medical History:  Diagnosis Date  . Anemia   . Migraine   . Ovarian cyst     Past Surgical History:  Procedure Laterality Date  . OOPHORECTOMY Right     Family History  Problem Relation Age of Onset  . Hypertension Maternal Grandmother   .  Cancer Maternal Grandfather        pancreatic    Social History   Tobacco Use  . Smoking status: Former Smoker    Packs/day: 0.50    Types: Cigarettes, Cigars  . Smokeless tobacco: Former Neurosurgeon  . Tobacco comment: quit with preg  Substance Use Topics  . Alcohol use: No  . Drug use: No    Allergies: No Known Allergies  Medications Prior to Admission  Medication Sig Dispense Refill Last Dose  . promethazine (PHENERGAN) 12.5 MG tablet Take 12.5 mg by mouth every 6 (six) hours as needed for nausea or vomiting.   05/14/2020 at Unknown time  . cetirizine (ZYRTEC) 10 MG tablet Take 10 mg by mouth daily.   More than a month at Unknown time  . ibuprofen (ADVIL,MOTRIN) 100 MG tablet Take 100 mg by mouth every 6 (six) hours as needed for fever.     . methocarbamol (ROBAXIN) 500 MG tablet Take 1 tablet (500 mg total) by mouth 2 (two) times daily. 20 tablet 0   . naproxen (NAPROSYN) 500 MG tablet TAKE 1 TABLET BY MOUTH EVERY 12 HOURS AS NEEDED FOR 30 DAYS  0     Review  of Systems  Constitutional: Negative.  Negative for fever.  HENT: Negative.  Negative for rhinorrhea and sore throat.   Eyes: Positive for photophobia. Negative for blurred vision.  Respiratory: Negative.  Negative for cough.   Gastrointestinal: Positive for nausea and vomiting.  Neurological: Positive for headaches. Negative for loss of balance.  Hematological: Negative.   Psychiatric/Behavioral: Negative.    Physical Exam   Blood pressure 130/79, pulse (!) 118, temperature 98 F (36.7 C), temperature source Oral, resp. rate 18, height 5' (1.524 m), weight 134.4 kg, last menstrual period 03/24/2020, SpO2 100 %, unknown if currently breastfeeding.  Physical Exam Constitutional:      Appearance: She is well-developed.  Abdominal:     General: Abdomen is flat. Bowel sounds are normal.     Palpations: Abdomen is soft.  Neurological:     Mental Status: She is alert.     MAU Course  Procedures  MDM -patient UA was  normal, no signs of dehydration.  -two tabs of fioricet and zofran given>PO challenge, tolerated well.  Patient reports complete resolution of HA in MAU.  Assessment and Plan   1. Morning sickness    -Patient sent home with RZ for Zofran, constipation prevention advice and RX for Fioricet as a rescue medicine.  -Keep follow up appt with [facility]  -Return to MAU if cannot keep down liquids; first trimester precautions given  Charlesetta Garibaldi Nikola Marone 05/18/2020, 11:08 AM

## 2020-10-24 ENCOUNTER — Encounter: Payer: Self-pay | Admitting: Neurology

## 2020-11-03 ENCOUNTER — Other Ambulatory Visit: Payer: Self-pay

## 2020-11-03 DIAGNOSIS — M542 Cervicalgia: Secondary | ICD-10-CM

## 2020-11-15 ENCOUNTER — Ambulatory Visit
Admission: RE | Admit: 2020-11-15 | Discharge: 2020-11-15 | Disposition: A | Discharge status: Home / Self Care | Payer: Medicaid Other | Source: Ambulatory Visit | Attending: Family Medicine | Admitting: Family Medicine

## 2020-11-15 ENCOUNTER — Other Ambulatory Visit: Payer: Self-pay

## 2020-11-15 DIAGNOSIS — M542 Cervicalgia: Secondary | ICD-10-CM

## 2020-12-29 ENCOUNTER — Telehealth (HOSPITAL_BASED_OUTPATIENT_CLINIC_OR_DEPARTMENT_OTHER): Payer: Self-pay | Admitting: Orthopaedic Surgery

## 2020-12-29 ENCOUNTER — Encounter (HOSPITAL_COMMUNITY): Payer: Self-pay

## 2020-12-29 ENCOUNTER — Emergency Department (HOSPITAL_COMMUNITY): Payer: Medicaid Other

## 2020-12-29 ENCOUNTER — Other Ambulatory Visit: Payer: Self-pay

## 2020-12-29 ENCOUNTER — Emergency Department (HOSPITAL_COMMUNITY)
Admission: EM | Admit: 2020-12-29 | Discharge: 2020-12-29 | Disposition: A | Discharge status: Home / Self Care | Payer: Medicaid Other | Attending: Emergency Medicine | Admitting: Emergency Medicine

## 2020-12-29 DIAGNOSIS — Y9301 Activity, walking, marching and hiking: Secondary | ICD-10-CM | POA: Insufficient documentation

## 2020-12-29 DIAGNOSIS — W010XXA Fall on same level from slipping, tripping and stumbling without subsequent striking against object, initial encounter: Secondary | ICD-10-CM | POA: Insufficient documentation

## 2020-12-29 DIAGNOSIS — S9031XA Contusion of right foot, initial encounter: Secondary | ICD-10-CM

## 2020-12-29 DIAGNOSIS — Z87891 Personal history of nicotine dependence: Secondary | ICD-10-CM | POA: Insufficient documentation

## 2020-12-29 DIAGNOSIS — S8391XA Sprain of unspecified site of right knee, initial encounter: Secondary | ICD-10-CM

## 2020-12-29 DIAGNOSIS — W19XXXA Unspecified fall, initial encounter: Secondary | ICD-10-CM

## 2020-12-29 DIAGNOSIS — S82851A Displaced trimalleolar fracture of right lower leg, initial encounter for closed fracture: Secondary | ICD-10-CM | POA: Insufficient documentation

## 2020-12-29 DIAGNOSIS — M25571 Pain in right ankle and joints of right foot: Secondary | ICD-10-CM | POA: Diagnosis not present

## 2020-12-29 DIAGNOSIS — Y9289 Other specified places as the place of occurrence of the external cause: Secondary | ICD-10-CM | POA: Insufficient documentation

## 2020-12-29 DIAGNOSIS — R52 Pain, unspecified: Secondary | ICD-10-CM

## 2020-12-29 DIAGNOSIS — S99911A Unspecified injury of right ankle, initial encounter: Secondary | ICD-10-CM | POA: Diagnosis present

## 2020-12-29 MED ORDER — OXYCODONE-ACETAMINOPHEN 5-325 MG PO TABS
1.0000 | ORAL_TABLET | Freq: Once | ORAL | Status: AC
  Administered 2020-12-29: 1 via ORAL
  Filled 2020-12-29: qty 1

## 2020-12-29 MED ORDER — KETOROLAC TROMETHAMINE 30 MG/ML IJ SOLN
15.0000 mg | Freq: Once | INTRAMUSCULAR | Status: AC
  Administered 2020-12-29: 15 mg via INTRAVENOUS
  Filled 2020-12-29: qty 1

## 2020-12-29 MED ORDER — PROPOFOL 10 MG/ML IV BOLUS
0.5000 mg/kg | Freq: Once | INTRAVENOUS | Status: AC
  Administered 2020-12-29: 60 mg via INTRAVENOUS
  Filled 2020-12-29: qty 20

## 2020-12-29 MED ORDER — PROPOFOL 10 MG/ML IV BOLUS
INTRAVENOUS | Status: AC | PRN
  Administered 2020-12-29: 67.2 mg via INTRAVENOUS

## 2020-12-29 MED ORDER — FENTANYL CITRATE PF 50 MCG/ML IJ SOSY
50.0000 ug | PREFILLED_SYRINGE | INTRAMUSCULAR | Status: DC | PRN
  Administered 2020-12-29: 50 ug via INTRAVENOUS
  Filled 2020-12-29: qty 1

## 2020-12-29 MED ORDER — FENTANYL CITRATE PF 50 MCG/ML IJ SOSY
100.0000 ug | PREFILLED_SYRINGE | Freq: Once | INTRAMUSCULAR | Status: AC
  Administered 2020-12-29: 50 ug via INTRAVENOUS
  Filled 2020-12-29: qty 2

## 2020-12-29 MED ORDER — FENTANYL CITRATE (PF) 100 MCG/2ML IJ SOLN
INTRAMUSCULAR | Status: AC | PRN
  Administered 2020-12-29: 50 ug via INTRAVENOUS

## 2020-12-29 MED ORDER — SODIUM CHLORIDE 0.9 % IV SOLN
INTRAVENOUS | Status: AC | PRN
  Administered 2020-12-29: 10 mL/h via INTRAVENOUS

## 2020-12-29 MED ORDER — OXYCODONE HCL 5 MG PO TABS: 5.0000 mg | ORAL_TABLET | Freq: Three times a day (TID) | ORAL | 0 refills | Status: AC | PRN

## 2020-12-29 NOTE — Discharge Instructions (Addendum)
I have put the information for an orthopedic doctor who will see you in the next couple days.  Please be on the look out for a phone call from their office.  I have given you instructions on how to alternate between Tylenol and ibuprofen below.  I have also given you 6 tablets of Roxicodone which is a stronger pain medicine to use as needed for breakthrough pain.  Please rest ice and elevate.  Use the crutches to get around do not weight-bear on this leg.  Please use Tylenol or ibuprofen for pain.  You may use 600 mg ibuprofen every 6 hours or 1000 mg of Tylenol every 6 hours.  You may choose to alternate between the 2.  This would be most effective.  Not to exceed 4 g of Tylenol within 24 hours.  Not to exceed 3200 mg ibuprofen 24 hours.

## 2020-12-29 NOTE — ED Provider Notes (Signed)
.  Sedation  Date/Time: 12/29/2020 6:16 PM Performed by: Pollyann Savoy, MD Authorized by: Pollyann Savoy, MD   Consent:    Consent obtained:  Verbal   Consent given by:  Patient Universal protocol:    Immediately prior to procedure, a time out was called: yes   Indications:    Procedure necessitating sedation performed by:  Different physician Pre-sedation assessment:    Time since last food or drink:  8 hours   ASA classification: class 1 - normal, healthy patient     Mouth opening:  3 or more finger widths   Thyromental distance:  4 finger widths   Mallampati score:  I - soft palate, uvula, fauces, pillars visible   Neck mobility: normal     Pre-sedation assessments completed and reviewed: airway patency, cardiovascular function, hydration status, mental status, nausea/vomiting, pain level, respiratory function and temperature   Immediate pre-procedure details:    Reassessment: Patient reassessed immediately prior to procedure     Reviewed: vital signs     Verified: bag valve mask available, intubation equipment available, oxygen available, reversal medications available and suction available   Procedure details (see MAR for exact dosages):    Preoxygenation:  Nasal cannula   Sedation:  Propofol   Intended level of sedation: deep   Analgesia:  Fentanyl   Intra-procedure monitoring:  Blood pressure monitoring, cardiac monitor, continuous capnometry, continuous pulse oximetry, frequent LOC assessments and frequent vital sign checks   Intra-procedure events: none     Total Provider sedation time (minutes):  15 Post-procedure details:    Post-sedation assessment completed:  12/29/2020 6:18 PM   Attendance: Constant attendance by certified staff until patient recovered     Recovery: Patient returned to pre-procedure baseline     Procedure completion:  Tolerated well, no immediate complications    Pollyann Savoy, MD 12/29/20 1818

## 2020-12-29 NOTE — ED Notes (Signed)
O2/ [place] removed

## 2020-12-29 NOTE — ED Provider Notes (Signed)
[facility]-EMERGENCY DEPT Provider Note   CSN: [id number] Arrival date & time: 12/29/20  1528     History Chief Complaint  Patient presents with   Ankle Pain    Cynthia Horne is a 30 y.o. female.  HPI Patient is a 30 year old female with past medical history significant for anemia, migraine, ovarian cyst  She is presented to the ER today of a fall that occurred prior to arrival.  She states that she was walking in crocs on the wet pavement and states that she is slid her right foot twisted and she fell awkwardly onto her right ankle.  She states that she had sudden onset of severe 10/10 ankle pain.  She states that EMS was called to scene and she was given pain medicine which improved her pain but did not completely ameliorated.  She states that she did not hit her head or lose consciousness denies any upper extremity chest abdomen or pelvic pain.  She states she has some right knee pain and right shin pain she states that she has good sensation in her feet and can wiggle her toes however.  Denies any bleeding, lacerations.  Denies any loss of consciousness.  States that the fall was due to slipping on wet pavement.  Denies any symptoms prior to falling.  Denies any pain anywhere other than her right lower extremity.     Past Medical History:  Diagnosis Date   Anemia    Migraine    Ovarian cyst     Patient Active Problem List   Diagnosis Date Noted   Migraine headache with aura 11/05/2015    Past Surgical History:  Procedure Laterality Date   OOPHORECTOMY Right      OB History     Gravida  2   Para  1   Term  1   Preterm      AB      Living  1      SAB      IAB      Ectopic      Multiple  0   Live Births  1           Family History  Problem Relation Age of Onset   Hypertension Maternal Grandmother    Cancer Maternal Grandfather        pancreatic    Social History   Tobacco Use   Smoking status: Former    Packs/day:  0.50    Types: Cigarettes, Cigars   Smokeless tobacco: Former   Tobacco comments:    quit with preg  Substance Use Topics   Alcohol use: No   Drug use: No    Home Medications Prior to Admission medications   Medication Sig Start Date End Date Taking? Authorizing Provider  butalbital-acetaminophen-caffeine (FIORICET) [phone] MG tablet Take 1-2 tablets by mouth every 6 (six) hours as needed for headache. 05/18/20 05/18/21  Marylene Land, CNM  cetirizine (ZYRTEC) 10 MG tablet Take 10 mg by mouth daily.    [provider]  ibuprofen (ADVIL,MOTRIN) 100 MG tablet Take 100 mg by mouth every 6 (six) hours as needed for fever.    [provider]  methocarbamol (ROBAXIN) 500 MG tablet Take 1 tablet (500 mg total) by mouth 2 (two) times daily. 10/03/18   Carlyle Basques P, PA-C  ondansetron (ZOFRAN ODT) 8 MG disintegrating tablet Take 1 tablet (8 mg total) by mouth every 8 (eight) hours as needed for nausea or vomiting. 05/18/20  Marylene Land, CNM  promethazine (PHENERGAN) 12.5 MG tablet Take 12.5 mg by mouth every 6 (six) hours as needed for nausea or vomiting.    [provider]    Allergies    Patient has no known allergies.  Review of Systems   Review of Systems  Constitutional:  Negative for fever.  HENT:  Negative for congestion.   Respiratory:  Negative for shortness of breath.   Cardiovascular:  Negative for chest pain.  Gastrointestinal:  Negative for abdominal distention.  Genitourinary:  Negative for dysuria.  Musculoskeletal:  Positive for arthralgias.       R ankle pain  Skin:  Negative for wound.  Neurological:  Negative for dizziness and headaches.  Psychiatric/Behavioral:  Negative for agitation.    Physical Exam Updated Vital Signs BP (!) 153/94 (BP Location: Right Arm)   Pulse 89   Temp 98.4 F (36.9 C) (Oral)   Resp (!) 22   Ht 5\' 4"  (1.626 m)   Wt 134.4 kg   LMP 12/19/2020 (Exact Date)   SpO2 100%    Breastfeeding Unknown   BMI 50.86 kg/m   Physical Exam Vitals and nursing note reviewed.  Constitutional:      General: She is not in acute distress.    Appearance: She is obese.  HENT:     Head: Normocephalic and atraumatic.     Nose: Nose normal.  Eyes:     General: No scleral icterus. Cardiovascular:     Rate and Rhythm: Normal rate and regular rhythm.     Pulses: Normal pulses.     Heart sounds: Normal heart sounds.  Pulmonary:     Effort: Pulmonary effort is normal. No respiratory distress.     Breath sounds: No wheezing.  Abdominal:     Palpations: Abdomen is soft.     Tenderness: There is no abdominal tenderness. There is no guarding or rebound.  Musculoskeletal:     Cervical back: Normal range of motion.     Right lower leg: No edema.     Left lower leg: No edema.     Comments: Right foot is deviated laterally.  There is a deformity at the right ankle joint.  Patient has palpable dorsalis pedis which was confirmed to be biphasic with Doppler.  Cap refill less than 3 seconds in both great toes.  Sensation intact and able to wiggle toes. Patient is diffuse and palpation of the right knee on the lateral part of the joint also has some tenderness to palpation of the shin seems to be worse approximately and distally.  There is also symptoms palpation at the base of the right fifth metatarsal but also diffuse tenderness to the entirety of foot.  Skin:    General: Skin is warm and dry.     Capillary Refill: Capillary refill takes less than 2 seconds.  Neurological:     Mental Status: She is alert. Mental status is at baseline.  Psychiatric:        Mood and Affect: Mood normal.        Behavior: Behavior normal.    ED Results / Procedures / Treatments   Labs (all labs ordered are listed, but only abnormal results are displayed) Labs Reviewed - No data to display  EKG None  Radiology No results found.  Procedures Reduction of fracture  Date/Time: 12/29/2020 5:33  PM Performed by: 02/28/2021, PA Authorized by: Gailen Shelter, PA  Consent: Verbal consent obtained. Risks and benefits: risks, benefits  and alternatives were discussed Consent given by: patient Patient understanding: patient states understanding of the procedure being performed Patient consent: the patient's understanding of the procedure matches consent given Relevant documents: relevant documents present and verified Test results: test results available and properly labeled Imaging studies: imaging studies available Patient identity confirmed: verbally with patient and arm band Time out: Immediately prior to procedure a "time out" was called to verify the correct patient, procedure, equipment, support staff and site/side marked as required. Local anesthesia used: no  Anesthesia: Local anesthesia used: no  Sedation: Patient sedated: yes Sedation type: (Fentanyl and propofol see note by Dr. Bernette Mayers)  Patient tolerance: patient tolerated the procedure well with no immediate complications Comments: Reduction of right ankle fracture and ankle dislocation.  Bimalleolar fracture with dislocation     Medications Ordered in ED Medications  propofol (DIPRIVAN) 10 mg/mL bolus/IV push 67.2 mg (has no administration in time range)  fentaNYL (SUBLIMAZE) injection 100 mcg (has no administration in time range)  ketorolac (TORADOL) 30 MG/ML injection 15 mg (15 mg Intravenous Given 12/29/20 1603)    ED Course  I have reviewed the triage vital signs and the nursing notes.  Pertinent labs & imaging results that were available during my care of the patient were reviewed by me and considered in my medical decision making (see chart for details).  Patient is a 30 year old female with a past medical history of anemia no other pertinent medical problems.  She is presenting to the ER today after she slipped and fell prior to arrival.  She injured her right ankle.  On my evaluation her right  foot is deviated laterally.  It appears dislocated I suspect associated fractures.  X-ray images obtained I have personally reviewed them.  Clinical Course as of 12/29/20 1943  Mon Dec 29, 2020  1703 IMPRESSION: 1. Probable avulsion fracture off the dorsal navicular 2. Fracture dislocation deformity at the ankle   [WF]  1703 IMPRESSION: Comminuted distal tibial and fibular fractures with lateral and posterior dislocation of the talus with respect to the distal tibia. [WF]  1703 Tib-fib/knee x-ray unremarkable. [WF]    Clinical Course User Index [WF] Gailen Shelter, PA   I personally reduced the fractured dislocated right ankle.  Applied splint afterwards with the help of Ortho tech.  Ortho-Glass was used.  Patient is distally neurovascularly intact with good cap refill sensation able to wiggle toes after reduction and splinting.  Patient tolerated procedure well.  States that her pain is much improved now.  I discussed this case with my attending physician who cosigned this note including patient's presenting symptoms, physical exam, and planned diagnostics and interventions. Attending physician stated agreement with plan or made changes to plan which were implemented.   Attending physician assessed patient at bedside.   Patient on reevaluation continues to have NTTP anywhere other than the right lower extremity.  Discussed with Dr. Steward Drone -- Pt scheduled for follow up appt tomorrow.   MDM Rules/Calculators/A&P                           Final Clinical Impression(s) / ED Diagnoses Final diagnoses:  Closed trimalleolar fracture of ankle, right, initial encounter  Sprain of right knee, unspecified ligament, initial encounter  Contusion of right foot, initial encounter  Fall, initial encounter    Rx / DC Orders ED Discharge Orders     None        Gailen Shelter, [place] 12/29/20  1945    Pollyann SavoySheldon, Charles B, MD 12/29/20 2300

## 2020-12-29 NOTE — ED Notes (Signed)
80mg  of Propofol wasted into sharp container by . Witnessed by [employer] RN

## 2020-12-29 NOTE — Progress Notes (Signed)
Orthopedic Tech Progress Note Patient Details:  Cynthia Horne 1990-09-14 [id number]    Appliance/Device Applied: One-step fiberglass (ortho glass) splint  Procedure: Application of posterior short leg splint w/ stirrup Extremity Area: Right lower extremity Comments: Application followed conscious sedation and reduction of Rt ankle. Pt tolerated application well.    Ortho Devices Type of Ortho Device: Crutches Ortho Device/Splint Location: Left at bedside Ortho Device/Splint Interventions: (P) Adjustment, Ordered   Post Interventions Patient Tolerated: (P) Unable to use device properly (pt in bed) Instructions Provided: (P) Poper ambulation with device, Adjustment of device, Care of device  [name] Carmine Savoy 12/29/2020, 6:05 PM

## 2020-12-29 NOTE — ED Triage Notes (Signed)
BIBA Per EMS: Pt was walking today and fell and slid down a grassy bank. Pt has obvious ankle deformity. Pt has pedal pulses and only c/o the right ankle injury. given by EMS. 16G L AC.  Vitals  148/80 97 HR  100% RA

## 2020-12-30 ENCOUNTER — Ambulatory Visit (INDEPENDENT_AMBULATORY_CARE_PROVIDER_SITE_OTHER): Payer: Medicaid Other | Admitting: Orthopaedic Surgery

## 2020-12-30 ENCOUNTER — Encounter (HOSPITAL_BASED_OUTPATIENT_CLINIC_OR_DEPARTMENT_OTHER): Payer: Self-pay | Admitting: Orthopaedic Surgery

## 2020-12-30 ENCOUNTER — Ambulatory Visit (HOSPITAL_BASED_OUTPATIENT_CLINIC_OR_DEPARTMENT_OTHER): Payer: Self-pay | Admitting: Orthopaedic Surgery

## 2020-12-30 ENCOUNTER — Encounter (HOSPITAL_COMMUNITY): Payer: Self-pay

## 2020-12-30 ENCOUNTER — Other Ambulatory Visit (HOSPITAL_BASED_OUTPATIENT_CLINIC_OR_DEPARTMENT_OTHER): Payer: Self-pay

## 2020-12-30 VITALS — BP 139/67 | Ht 65.0 in | Wt 290.0 lb

## 2020-12-30 DIAGNOSIS — S82841A Displaced bimalleolar fracture of right lower leg, initial encounter for closed fracture: Secondary | ICD-10-CM

## 2020-12-30 MED ORDER — IBUPROFEN 800 MG PO TABS
800.0000 mg | ORAL_TABLET | Freq: Three times a day (TID) | ORAL | 0 refills | Status: AC
  Filled 2020-12-30: qty 30, 10d supply, fill #0

## 2020-12-30 MED ORDER — ACETAMINOPHEN 500 MG PO TABS
500.0000 mg | ORAL_TABLET | Freq: Three times a day (TID) | ORAL | 0 refills | Status: AC
  Filled 2020-12-30: qty 100, 34d supply, fill #0

## 2020-12-30 MED ORDER — WHEEL CHAIR K1 BASIC DESK ARM MISC: 1.0000 [IU] | Freq: Once | 0 refills | Status: DC

## 2020-12-30 MED ORDER — OXYCODONE HCL 5 MG PO TABS
5.0000 mg | ORAL_TABLET | ORAL | 0 refills | Status: DC | PRN
  Filled 2020-12-30: qty 20, 4d supply, fill #0

## 2020-12-30 MED ORDER — ASPIRIN EC 325 MG PO TBEC
325.0000 mg | DELAYED_RELEASE_TABLET | Freq: Every day | ORAL | 0 refills | Status: DC
  Filled 2020-12-30: qty 100, 100d supply, fill #0

## 2020-12-30 NOTE — H&P (View-Only) (Signed)
Chief Complaint: Right ankle pain     History of Present Illness:   Pain Score: 8/10 SANE: 0/100  Cynthia Horne is a 30 y.o. female with right ankle pain after a trip and fall on grass.  She denies any previous injuries to this ankle.  She is currently working from home for [employer].  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  She presented to the urgent care on 12/29/2020 for a dislocated right ankle.  She was provisionally splinted and sent for follow-up today.  She is having persistent pain in the right ankle.  She denies any other prominent medical history.    Surgical History:   None  PMH/PSH/Family History/Social History/Meds/Allergies:    Past Medical History:  Diagnosis Date   Anemia    Migraine    Ovarian cyst    Past Surgical History:  Procedure Laterality Date   OOPHORECTOMY Right    Social History   Socioeconomic History   Marital status: Single    Spouse name: Not on file   Number of children: 1   Years of education: Not on file   Highest education level: Some college, no degree  Occupational History   Occupation: Company secretary  Tobacco Use   Smoking status: Former    Packs/day: 0.50    Types: Cigarettes, Cigars   Smokeless tobacco: Former   Tobacco comments:    quit with preg  Substance and Sexual Activity   Alcohol use: No   Drug use: No   Sexual activity: Yes    Birth control/protection: None  Other Topics Concern   Not on file  Social History Narrative   Patient is right-handed.She lives in a one story house. Seh drinks 2 cups of coffee a day, and a glass of tea or soda  QOD. She walks extensively at work.   Social Determinants of Health   Financial Resource Strain: Not on file  Food Insecurity: Not on file  Transportation Needs: Not on file  Physical Activity: Not on file  Stress: Not on file  Social Connections: Not on file   Family History  Problem Relation Age of Onset    Hypertension Maternal Grandmother    Cancer Maternal Grandfather        pancreatic   No Known Allergies Current Outpatient Medications  Medication Sig Dispense Refill   acetaminophen (TYLENOL) 500 MG tablet Take 1,500 mg by mouth daily as needed (migraines).     butalbital-acetaminophen-caffeine (FIORICET) 50-325-40 MG tablet Take 1-2 tablets by mouth every 6 (six) hours as needed for headache. (Patient not taking: No sig reported) 20 tablet 0   cetirizine (ZYRTEC) 10 MG tablet Take 10 mg by mouth every other day.     oxyCODONE (ROXICODONE) 5 MG immediate release tablet Take 1 tablet (5 mg total) by mouth every 8 (eight) hours as needed for up to 3 days for severe pain. 6 tablet 0   No current facility-administered medications for this visit.   DG Tibia/Fibula Right  Result Date: 12/29/2020 CLINICAL DATA:  Ankle injury EXAM: RIGHT TIBIA AND FIBULA - 2 VIEW COMPARISON:  None. FINDINGS: No fracture or malalignment involving the proximal to mid tibia or fibula. Incompletely visualized ankle fractures. IMPRESSION: No acute osseous abnormality of the proximal and mid tibia and fibula. Electronically Signed   By:  Jasmine Pang M.D.   On: 12/29/2020 16:55   DG Ankle 2 Views Right  Result Date: 12/29/2020 CLINICAL DATA:  Reduction EXAM: RIGHT ANKLE - 2 VIEW COMPARISON:  12/29/2020 FINDINGS: Interval casting of the ankle. Reduction of previously noted ankle dislocation. Comminuted distal fibular and medial malleolar fractures with 1/4 shaft residual posterior displacement of distal fibular fracture fragment. Intra-articular fracture off the distal anterior tibia. IMPRESSION: Reduction of previously noted ankle dislocation with decreased displacement distal fibular and medial malleolar fractures. Electronically Signed   By: Jasmine Pang M.D.   On: 12/29/2020 17:54   DG Ankle Complete Right  Result Date: 12/29/2020 CLINICAL DATA:  Injury fall EXAM: RIGHT ANKLE - COMPLETE 3+ VIEW COMPARISON:  None.  FINDINGS: Acute markedly comminuted distal fibular fracture. Acute comminuted displaced medial malleolar fracture. Limited evaluation of posterior malleolus due to osseous overlap. There is posterior and lateral dislocation of the talus bone with moderate lateral angulation of the foot with respect to the distal shaft of the tibia and fibula. IMPRESSION: Comminuted distal tibial and fibular fractures with lateral and posterior dislocation of the talus with respect to the distal tibia. Electronically Signed   By: Jasmine Pang M.D.   On: 12/29/2020 16:57   DG Knee Complete 4 Views Right  Result Date: 12/29/2020 CLINICAL DATA:  Ankle fracture EXAM: RIGHT KNEE - COMPLETE 4+ VIEW COMPARISON:  None. FINDINGS: No fracture or malalignment. No significant knee effusion. The joint spaces are patent IMPRESSION: Negative. Electronically Signed   By: Jasmine Pang M.D.   On: 12/29/2020 16:56   DG Foot 2 Views Right  Result Date: 12/29/2020 CLINICAL DATA:  Fall with deformity EXAM: RIGHT FOOT - 2 VIEW COMPARISON:  None. FINDINGS: Fracture dislocation deformity at the ankle. Probable dorsal cortical avulsion off the navicular bone. IMPRESSION: 1. Probable avulsion fracture off the dorsal navicular 2. Fracture dislocation deformity at the ankle Electronically Signed   By: Jasmine Pang M.D.   On: 12/29/2020 16:55    Review of Systems:   A ROS was performed including pertinent positives and negatives as documented in the HPI.  Physical Exam :   Constitutional: NAD and appears stated age Neurological: Alert and oriented Psych: Appropriate affect and cooperative Blood pressure 139/67, height 5\' 5"  (1.651 m), weight 290 lb (131.5 kg), last menstrual period 12/19/2020, unknown if currently breastfeeding.   Comprehensive Musculoskeletal Exam:   Right ankle is splinted.  Exposed toes she has sensation intact to light touch.  She is able to plantarflex and dorsiflex the toes.  2+ dorsalis pedis pulse.  Toes are warm and  well-perfused  Imaging:   Xray (right ankle 3 views): Bimalleolar right ankle fracture  I personally reviewed and interpreted the radiographs.   Assessment:   30 year old female with right bimalleolar ankle fracture after a fall and slipped on grass.  I discussed that ultimately I would recommend open reduction internal fixation to optimize her alignment.  I have advised that given her young age and activity level that this would give her the best option for long-term function of the ankle and in order to optimize her native anatomy.  She understands this and would like to proceed with surgery  Plan :    -Plan for right ankle open reduction internal fixation   After a lengthy discussion of treatment options, including risks, benefits, alternatives, complications of surgical and nonsurgical conservative options, the patient elected surgical repair.   The patient  is aware of the material risks  and complications  including, but not limited to injury to adjacent structures, neurovascular injury, infection, numbness, bleeding, implant failure, thermal burns, stiffness, persistent pain, failure to heal, disease transmission from allograft, need for further surgery, dislocation, anesthetic risks, blood clots, risks of death,and others. The probabilities of surgical success and failure discussed with patient given their particular co-morbidities.The time and nature of expected rehabilitation and recovery was discussed.The patient's questions were all answered preoperatively.  No barriers to understanding were noted. I explained the natural history of the disease process and Rx rationale.  I explained to the patient what I considered to be reasonable expectations given their personal situation.  The final treatment plan was arrived at through a shared patient decision making process model.   I personally saw and evaluated the patient, and participated in the management and treatment plan.  Huel Cote, MD Attending Physician, Orthopedic Surgery  This document was dictated using Dragon voice recognition software. A reasonable attempt at proof reading has been made to minimize errors.

## 2020-12-30 NOTE — Progress Notes (Signed)
EKG: na CXR: na ECHO: denies Stress Test: denies Cardiac Cath: denies  Fasting Blood Sugar- na Checks Blood Sugar__na_ times a day  OSA/CPAP: No  ASA/Blood Thinner: No  Covid test not needed  Anesthesia Review: No  Patient denies shortness of breath, fever, cough, and chest pain at PAT appointment.  Patient verbalized understanding of instructions provided today at the PAT appointment.  Patient asked to review instructions at home and day of surgery.   

## 2020-12-30 NOTE — Progress Notes (Signed)
Chief Complaint: Right ankle pain     History of Present Illness:   Pain Score: 8/10 SANE: 0/100  Cynthia Horne is a 30 y.o. female with right ankle pain after a trip and fall on grass.  She denies any previous injuries to this ankle.  She is currently working from home for [employer].  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  She presented to the urgent care on 12/29/2020 for a dislocated right ankle.  She was provisionally splinted and sent for follow-up today.  She is having persistent pain in the right ankle.  She denies any other prominent medical history.    Surgical History:   None  PMH/PSH/Family History/Social History/Meds/Allergies:    Past Medical History:  Diagnosis Date   Anemia    Migraine    Ovarian cyst    Past Surgical History:  Procedure Laterality Date   OOPHORECTOMY Right    Social History   Socioeconomic History   Marital status: Single    Spouse name: Not on file   Number of children: 1   Years of education: Not on file   Highest education level: Some college, no degree  Occupational History   Occupation: Company secretary  Tobacco Use   Smoking status: Former    Packs/day: 0.50    Types: Cigarettes, Cigars   Smokeless tobacco: Former   Tobacco comments:    quit with preg  Substance and Sexual Activity   Alcohol use: No   Drug use: No   Sexual activity: Yes    Birth control/protection: None  Other Topics Concern   Not on file  Social History Narrative   Patient is right-handed.She lives in a one story house. Seh drinks 2 cups of coffee a day, and a glass of tea or soda  QOD. She walks extensively at work.   Social Determinants of Health   Financial Resource Strain: Not on file  Food Insecurity: Not on file  Transportation Needs: Not on file  Physical Activity: Not on file  Stress: Not on file  Social Connections: Not on file   Family History  Problem Relation Age of Onset    Hypertension Maternal Grandmother    Cancer Maternal Grandfather        pancreatic   No Known Allergies Current Outpatient Medications  Medication Sig Dispense Refill   acetaminophen (TYLENOL) 500 MG tablet Take 1,500 mg by mouth daily as needed (migraines).     butalbital-acetaminophen-caffeine (FIORICET) 50-325-40 MG tablet Take 1-2 tablets by mouth every 6 (six) hours as needed for headache. (Patient not taking: No sig reported) 20 tablet 0   cetirizine (ZYRTEC) 10 MG tablet Take 10 mg by mouth every other day.     oxyCODONE (ROXICODONE) 5 MG immediate release tablet Take 1 tablet (5 mg total) by mouth every 8 (eight) hours as needed for up to 3 days for severe pain. 6 tablet 0   No current facility-administered medications for this visit.   DG Tibia/Fibula Right  Result Date: 12/29/2020 CLINICAL DATA:  Ankle injury EXAM: RIGHT TIBIA AND FIBULA - 2 VIEW COMPARISON:  None. FINDINGS: No fracture or malalignment involving the proximal to mid tibia or fibula. Incompletely visualized ankle fractures. IMPRESSION: No acute osseous abnormality of the proximal and mid tibia and fibula. Electronically Signed   By:  Jasmine Pang M.D.   On: 12/29/2020 16:55   DG Ankle 2 Views Right  Result Date: 12/29/2020 CLINICAL DATA:  Reduction EXAM: RIGHT ANKLE - 2 VIEW COMPARISON:  12/29/2020 FINDINGS: Interval casting of the ankle. Reduction of previously noted ankle dislocation. Comminuted distal fibular and medial malleolar fractures with 1/4 shaft residual posterior displacement of distal fibular fracture fragment. Intra-articular fracture off the distal anterior tibia. IMPRESSION: Reduction of previously noted ankle dislocation with decreased displacement distal fibular and medial malleolar fractures. Electronically Signed   By: Jasmine Pang M.D.   On: 12/29/2020 17:54   DG Ankle Complete Right  Result Date: 12/29/2020 CLINICAL DATA:  Injury fall EXAM: RIGHT ANKLE - COMPLETE 3+ VIEW COMPARISON:  None.  FINDINGS: Acute markedly comminuted distal fibular fracture. Acute comminuted displaced medial malleolar fracture. Limited evaluation of posterior malleolus due to osseous overlap. There is posterior and lateral dislocation of the talus bone with moderate lateral angulation of the foot with respect to the distal shaft of the tibia and fibula. IMPRESSION: Comminuted distal tibial and fibular fractures with lateral and posterior dislocation of the talus with respect to the distal tibia. Electronically Signed   By: Jasmine Pang M.D.   On: 12/29/2020 16:57   DG Knee Complete 4 Views Right  Result Date: 12/29/2020 CLINICAL DATA:  Ankle fracture EXAM: RIGHT KNEE - COMPLETE 4+ VIEW COMPARISON:  None. FINDINGS: No fracture or malalignment. No significant knee effusion. The joint spaces are patent IMPRESSION: Negative. Electronically Signed   By: Jasmine Pang M.D.   On: 12/29/2020 16:56   DG Foot 2 Views Right  Result Date: 12/29/2020 CLINICAL DATA:  Fall with deformity EXAM: RIGHT FOOT - 2 VIEW COMPARISON:  None. FINDINGS: Fracture dislocation deformity at the ankle. Probable dorsal cortical avulsion off the navicular bone. IMPRESSION: 1. Probable avulsion fracture off the dorsal navicular 2. Fracture dislocation deformity at the ankle Electronically Signed   By: Jasmine Pang M.D.   On: 12/29/2020 16:55    Review of Systems:   A ROS was performed including pertinent positives and negatives as documented in the HPI.  Physical Exam :   Constitutional: NAD and appears stated age Neurological: Alert and oriented Psych: Appropriate affect and cooperative Blood pressure 139/67, height 5\' 5"  (1.651 m), weight 290 lb (131.5 kg), last menstrual period 12/19/2020, unknown if currently breastfeeding.   Comprehensive Musculoskeletal Exam:   Right ankle is splinted.  Exposed toes she has sensation intact to light touch.  She is able to plantarflex and dorsiflex the toes.  2+ dorsalis pedis pulse.  Toes are warm and  well-perfused  Imaging:   Xray (right ankle 3 views): Bimalleolar right ankle fracture  I personally reviewed and interpreted the radiographs.   Assessment:   30 year old female with right bimalleolar ankle fracture after a fall and slipped on grass.  I discussed that ultimately I would recommend open reduction internal fixation to optimize her alignment.  I have advised that given her young age and activity level that this would give her the best option for long-term function of the ankle and in order to optimize her native anatomy.  She understands this and would like to proceed with surgery  Plan :    -Plan for right ankle open reduction internal fixation   After a lengthy discussion of treatment options, including risks, benefits, alternatives, complications of surgical and nonsurgical conservative options, the patient elected surgical repair.   The patient  is aware of the material risks  and complications  including, but not limited to injury to adjacent structures, neurovascular injury, infection, numbness, bleeding, implant failure, thermal burns, stiffness, persistent pain, failure to heal, disease transmission from allograft, need for further surgery, dislocation, anesthetic risks, blood clots, risks of death,and others. The probabilities of surgical success and failure discussed with patient given their particular co-morbidities.The time and nature of expected rehabilitation and recovery was discussed.The patient's questions were all answered preoperatively.  No barriers to understanding were noted. I explained the natural history of the disease process and Rx rationale.  I explained to the patient what I considered to be reasonable expectations given their personal situation.  The final treatment plan was arrived at through a shared patient decision making process model.   I personally saw and evaluated the patient, and participated in the management and treatment plan.  Huel Cote, MD Attending Physician, Orthopedic Surgery  This document was dictated using Dragon voice recognition software. A reasonable attempt at proof reading has been made to minimize errors.

## 2020-12-31 ENCOUNTER — Encounter (HOSPITAL_COMMUNITY)
Admission: RE | Disposition: A | Discharge status: Home / Self Care | Payer: Self-pay | Source: Home / Self Care | Attending: Orthopaedic Surgery

## 2020-12-31 ENCOUNTER — Other Ambulatory Visit: Payer: Self-pay

## 2020-12-31 ENCOUNTER — Ambulatory Visit (HOSPITAL_COMMUNITY): Payer: Medicaid Other

## 2020-12-31 ENCOUNTER — Ambulatory Visit (HOSPITAL_COMMUNITY): Payer: Medicaid Other | Admitting: Certified Registered"

## 2020-12-31 ENCOUNTER — Ambulatory Visit (HOSPITAL_COMMUNITY)
Admission: RE | Admit: 2020-12-31 | Discharge: 2020-12-31 | Disposition: A | Discharge status: Home / Self Care | Payer: Medicaid Other | Attending: Orthopaedic Surgery | Admitting: Orthopaedic Surgery

## 2020-12-31 ENCOUNTER — Encounter (HOSPITAL_COMMUNITY): Payer: Self-pay

## 2020-12-31 DIAGNOSIS — Z87891 Personal history of nicotine dependence: Secondary | ICD-10-CM | POA: Insufficient documentation

## 2020-12-31 DIAGNOSIS — S82841A Displaced bimalleolar fracture of right lower leg, initial encounter for closed fracture: Secondary | ICD-10-CM

## 2020-12-31 DIAGNOSIS — W010XXA Fall on same level from slipping, tripping and stumbling without subsequent striking against object, initial encounter: Secondary | ICD-10-CM | POA: Diagnosis not present

## 2020-12-31 DIAGNOSIS — Z87898 Personal history of other specified conditions: Secondary | ICD-10-CM

## 2020-12-31 DIAGNOSIS — S82851A Displaced trimalleolar fracture of right lower leg, initial encounter for closed fracture: Secondary | ICD-10-CM | POA: Diagnosis present

## 2020-12-31 DIAGNOSIS — Z419 Encounter for procedure for purposes other than remedying health state, unspecified: Secondary | ICD-10-CM

## 2020-12-31 HISTORY — PX: ORIF ANKLE FRACTURE: SHX5408

## 2020-12-31 LAB — CBC
HCT: 29.7 % — ABNORMAL LOW (ref 36.0–46.0)
Hemoglobin: 8.7 g/dL — ABNORMAL LOW (ref 12.0–15.0)
MCH: 22 pg — ABNORMAL LOW (ref 26.0–34.0)
MCHC: 29.3 g/dL — ABNORMAL LOW (ref 30.0–36.0)
MCV: 75 fL — ABNORMAL LOW (ref 80.0–100.0)
Platelets: 269 10*3/uL (ref 150–400)
RBC: 3.96 MIL/uL (ref 3.87–5.11)
RDW: 18.6 % — ABNORMAL HIGH (ref 11.5–15.5)
WBC: 8.3 10*3/uL (ref 4.0–10.5)
nRBC: 0 % (ref 0.0–0.2)

## 2020-12-31 LAB — SURGICAL PCR SCREEN
MRSA, PCR: NEGATIVE
Staphylococcus aureus: NEGATIVE

## 2020-12-31 SURGERY — OPEN REDUCTION INTERNAL FIXATION (ORIF) ANKLE FRACTURE
Anesthesia: General | Site: Ankle | Laterality: Right

## 2020-12-31 MED ORDER — PHENYLEPHRINE HCL-NACL 20-0.9 MG/250ML-% IV SOLN
INTRAVENOUS | Status: DC | PRN
  Administered 2020-12-31: 40 ug/min via INTRAVENOUS

## 2020-12-31 MED ORDER — PROMETHAZINE HCL 25 MG/ML IJ SOLN: 6.2500 mg | INTRAMUSCULAR | Status: DC | PRN

## 2020-12-31 MED ORDER — CHLORHEXIDINE GLUCONATE 0.12 % MT SOLN
15.0000 mL | Freq: Once | OROMUCOSAL | Status: AC
  Administered 2020-12-31: 15 mL via OROMUCOSAL

## 2020-12-31 MED ORDER — LIDOCAINE 2% (20 MG/ML) 5 ML SYRINGE
INTRAMUSCULAR | Status: DC | PRN
  Administered 2020-12-31: 80 mg via INTRAVENOUS

## 2020-12-31 MED ORDER — MIDAZOLAM HCL 2 MG/2ML IJ SOLN
2.0000 mg | Freq: Once | INTRAMUSCULAR | Status: AC
  Administered 2020-12-31: 2 mg via INTRAVENOUS

## 2020-12-31 MED ORDER — OXYCODONE HCL 5 MG/5ML PO SOLN
5.0000 mg | Freq: Once | ORAL | Status: AC | PRN
Start: 2020-12-31 — End: 2020-12-31

## 2020-12-31 MED ORDER — ACETAMINOPHEN 500 MG PO TABS: 1000.0000 mg | ORAL_TABLET | Freq: Once | ORAL | Status: DC

## 2020-12-31 MED ORDER — PROPOFOL 10 MG/ML IV BOLUS
INTRAVENOUS | Status: DC | PRN
  Administered 2020-12-31: 200 mg via INTRAVENOUS

## 2020-12-31 MED ORDER — ROCURONIUM BROMIDE 10 MG/ML (PF) SYRINGE
PREFILLED_SYRINGE | INTRAVENOUS | Status: AC
  Filled 2020-12-31: qty 10

## 2020-12-31 MED ORDER — FENTANYL CITRATE (PF) 100 MCG/2ML IJ SOLN
INTRAMUSCULAR | Status: AC
  Filled 2020-12-31: qty 2

## 2020-12-31 MED ORDER — TRANEXAMIC ACID-NACL 1000-0.7 MG/100ML-% IV SOLN
1000.0000 mg | INTRAVENOUS | Status: AC
  Administered 2020-12-31: 1000 mg via INTRAVENOUS

## 2020-12-31 MED ORDER — TRANEXAMIC ACID-NACL 1000-0.7 MG/100ML-% IV SOLN
INTRAVENOUS | Status: AC
  Filled 2020-12-31: qty 100

## 2020-12-31 MED ORDER — CEFAZOLIN IN SODIUM CHLORIDE 3-0.9 GM/100ML-% IV SOLN
3.0000 g | INTRAVENOUS | Status: AC
  Administered 2020-12-31: 3 g via INTRAVENOUS

## 2020-12-31 MED ORDER — ROPIVACAINE HCL 5 MG/ML IJ SOLN
INTRAMUSCULAR | Status: DC | PRN
  Administered 2020-12-31: 20 mL via PERINEURAL
  Administered 2020-12-31: 30 mL via PERINEURAL

## 2020-12-31 MED ORDER — GABAPENTIN 300 MG PO CAPS
300.0000 mg | ORAL_CAPSULE | Freq: Once | ORAL | Status: AC
  Administered 2020-12-31: 300 mg via ORAL

## 2020-12-31 MED ORDER — OXYCODONE HCL 5 MG PO TABS
5.0000 mg | ORAL_TABLET | Freq: Once | ORAL | Status: AC | PRN
  Administered 2020-12-31: 5 mg via ORAL

## 2020-12-31 MED ORDER — CHLORHEXIDINE GLUCONATE 0.12 % MT SOLN
OROMUCOSAL | Status: AC
  Filled 2020-12-31: qty 15

## 2020-12-31 MED ORDER — MIDAZOLAM HCL 2 MG/2ML IJ SOLN
INTRAMUSCULAR | Status: AC
  Filled 2020-12-31: qty 2

## 2020-12-31 MED ORDER — FENTANYL CITRATE (PF) 100 MCG/2ML IJ SOLN
100.0000 ug | Freq: Once | INTRAMUSCULAR | Status: AC
  Administered 2020-12-31: 100 ug via INTRAVENOUS

## 2020-12-31 MED ORDER — DEXAMETHASONE SODIUM PHOSPHATE 10 MG/ML IJ SOLN
INTRAMUSCULAR | Status: DC | PRN
  Administered 2020-12-31: 10 mg via INTRAVENOUS

## 2020-12-31 MED ORDER — DROPERIDOL 2.5 MG/ML IJ SOLN: 0.6250 mg | Freq: Once | INTRAMUSCULAR | Status: DC | PRN

## 2020-12-31 MED ORDER — SCOPOLAMINE 1 MG/3DAYS TD PT72
MEDICATED_PATCH | TRANSDERMAL | Status: AC
  Filled 2020-12-31: qty 1

## 2020-12-31 MED ORDER — PHENYLEPHRINE 40 MCG/ML (10ML) SYRINGE FOR IV PUSH (FOR BLOOD PRESSURE SUPPORT)
PREFILLED_SYRINGE | INTRAVENOUS | Status: AC
  Filled 2020-12-31: qty 10

## 2020-12-31 MED ORDER — ACETAMINOPHEN 500 MG PO TABS
ORAL_TABLET | ORAL | Status: AC
  Filled 2020-12-31: qty 2

## 2020-12-31 MED ORDER — LACTATED RINGERS IV SOLN: INTRAVENOUS | Status: DC | PRN

## 2020-12-31 MED ORDER — ROCURONIUM BROMIDE 10 MG/ML (PF) SYRINGE
PREFILLED_SYRINGE | INTRAVENOUS | Status: DC | PRN
Start: 2020-12-31 — End: 2020-12-31
  Administered 2020-12-31: 40 mg via INTRAVENOUS
  Administered 2020-12-31: 20 mg via INTRAVENOUS

## 2020-12-31 MED ORDER — LIDOCAINE 2% (20 MG/ML) 5 ML SYRINGE
INTRAMUSCULAR | Status: AC
  Filled 2020-12-31: qty 5

## 2020-12-31 MED ORDER — EPHEDRINE SULFATE-NACL 50-0.9 MG/10ML-% IV SOSY
PREFILLED_SYRINGE | INTRAVENOUS | Status: DC | PRN
Start: 2020-12-31 — End: 2020-12-31
  Administered 2020-12-31: 5 mg via INTRAVENOUS

## 2020-12-31 MED ORDER — PROPOFOL 10 MG/ML IV BOLUS
INTRAVENOUS | Status: AC
  Filled 2020-12-31: qty 20

## 2020-12-31 MED ORDER — SCOPOLAMINE 1 MG/3DAYS TD PT72
1.0000 | MEDICATED_PATCH | TRANSDERMAL | Status: DC
  Administered 2020-12-31: 1.5 mg via TRANSDERMAL

## 2020-12-31 MED ORDER — OXYCODONE HCL 5 MG PO TABS
ORAL_TABLET | ORAL | Status: AC
  Filled 2020-12-31: qty 1

## 2020-12-31 MED ORDER — ONDANSETRON HCL 4 MG/2ML IJ SOLN
INTRAMUSCULAR | Status: DC | PRN
  Administered 2020-12-31: 4 mg via INTRAVENOUS

## 2020-12-31 MED ORDER — LACTATED RINGERS IV SOLN: INTRAVENOUS | Status: DC

## 2020-12-31 MED ORDER — ORAL CARE MOUTH RINSE: 15.0000 mL | Freq: Once | OROMUCOSAL | Status: AC

## 2020-12-31 MED ORDER — FENTANYL CITRATE (PF) 100 MCG/2ML IJ SOLN
25.0000 ug | INTRAMUSCULAR | Status: DC | PRN
  Administered 2020-12-31: 50 ug via INTRAVENOUS

## 2020-12-31 MED ORDER — PHENYLEPHRINE 40 MCG/ML (10ML) SYRINGE FOR IV PUSH (FOR BLOOD PRESSURE SUPPORT)
PREFILLED_SYRINGE | INTRAVENOUS | Status: DC | PRN
  Administered 2020-12-31: 80 ug via INTRAVENOUS
  Administered 2020-12-31: 40 ug via INTRAVENOUS

## 2020-12-31 MED ORDER — SUCCINYLCHOLINE CHLORIDE 200 MG/10ML IV SOSY
PREFILLED_SYRINGE | INTRAVENOUS | Status: DC | PRN
  Administered 2020-12-31: 180 mg via INTRAVENOUS

## 2020-12-31 MED ORDER — CEFAZOLIN IN SODIUM CHLORIDE 3-0.9 GM/100ML-% IV SOLN
INTRAVENOUS | Status: AC
  Filled 2020-12-31: qty 100

## 2020-12-31 MED ORDER — SUGAMMADEX SODIUM 200 MG/2ML IV SOLN
INTRAVENOUS | Status: DC | PRN
  Administered 2020-12-31: 275 mg via INTRAVENOUS

## 2020-12-31 MED ORDER — FENTANYL CITRATE (PF) 250 MCG/5ML IJ SOLN
INTRAMUSCULAR | Status: DC | PRN
  Administered 2020-12-31: 50 ug via INTRAVENOUS
  Administered 2020-12-31: 100 ug via INTRAVENOUS
  Administered 2020-12-31 (×2): 50 ug via INTRAVENOUS

## 2020-12-31 MED ORDER — 0.9 % SODIUM CHLORIDE (POUR BTL) OPTIME
TOPICAL | Status: DC | PRN
  Administered 2020-12-31: 1000 mL

## 2020-12-31 MED ORDER — GABAPENTIN 300 MG PO CAPS
ORAL_CAPSULE | ORAL | Status: AC
  Filled 2020-12-31: qty 1

## 2020-12-31 MED ORDER — FENTANYL CITRATE (PF) 250 MCG/5ML IJ SOLN
INTRAMUSCULAR | Status: AC
  Filled 2020-12-31: qty 5

## 2020-12-31 MED ORDER — SUCCINYLCHOLINE CHLORIDE 200 MG/10ML IV SOSY
PREFILLED_SYRINGE | INTRAVENOUS | Status: AC
  Filled 2020-12-31: qty 10

## 2020-12-31 SURGICAL SUPPLY — 63 items
APL PRP STRL LF DISP 70% ISPRP (MISCELLANEOUS) ×1
BAG COUNTER SPONGE SURGICOUNT (BAG) ×2 IMPLANT
BAG SPNG CNTER NS LX DISP (BAG) ×1
BANDAGE ESMARK 6X9 LF (GAUZE/BANDAGES/DRESSINGS) ×1 IMPLANT
BIT DRILL 2.5 CANN LNG (BIT) ×1 IMPLANT
BIT DRILL 2.5 CANN STRL (BIT) ×1 IMPLANT
BIT DRILL 2.6 CANN (BIT) ×1 IMPLANT
BIT DRILL 3.5 CANN STRL (BIT) ×1 IMPLANT
BNDG CMPR 9X6 STRL LF SNTH (GAUZE/BANDAGES/DRESSINGS) ×1
BNDG CMPR MED 10X6 ELC LF (GAUZE/BANDAGES/DRESSINGS) ×1
BNDG COHESIVE 4X5 TAN ST LF (GAUZE/BANDAGES/DRESSINGS) ×1 IMPLANT
BNDG COHESIVE 4X5 TAN STRL (GAUZE/BANDAGES/DRESSINGS) ×2 IMPLANT
BNDG COHESIVE 6X5 TAN ST LF (GAUZE/BANDAGES/DRESSINGS) ×1 IMPLANT
BNDG ELASTIC 4X5.8 VLCR STR LF (GAUZE/BANDAGES/DRESSINGS) ×1 IMPLANT
BNDG ELASTIC 6X10 VLCR STRL LF (GAUZE/BANDAGES/DRESSINGS) ×1 IMPLANT
BNDG ELASTIC 6X5.8 VLCR STR LF (GAUZE/BANDAGES/DRESSINGS) IMPLANT
BNDG ESMARK 6X9 LF (GAUZE/BANDAGES/DRESSINGS) ×2
BRUSH SCRUB EZ PLAIN DRY (MISCELLANEOUS) ×4 IMPLANT
CHLORAPREP W/TINT 26 (MISCELLANEOUS) ×2 IMPLANT
COVER SURGICAL LIGHT HANDLE (MISCELLANEOUS) ×2 IMPLANT
DRAPE C-ARM 42X72 X-RAY (DRAPES) ×2 IMPLANT
DRAPE C-ARMOR (DRAPES) ×2 IMPLANT
DRAPE DERMATAC (DRAPES) ×4 IMPLANT
DRAPE U-SHAPE 47X51 STRL (DRAPES) ×2 IMPLANT
DRSG ADAPTIC 3X8 NADH LF (GAUZE/BANDAGES/DRESSINGS) IMPLANT
ELECT REM PT RETURN 9FT ADLT (ELECTROSURGICAL) ×2
ELECTRODE REM PT RTRN 9FT ADLT (ELECTROSURGICAL) ×1 IMPLANT
GAUZE SPONGE 4X4 12PLY STRL (GAUZE/BANDAGES/DRESSINGS) IMPLANT
GLOVE SURG UNDER POLY LF SZ7.5 (GLOVE) ×2 IMPLANT
GOWN STRL REUS W/ TWL LRG LVL3 (GOWN DISPOSABLE) ×2 IMPLANT
GOWN STRL REUS W/TWL LRG LVL3 (GOWN DISPOSABLE) ×4
GUIDEWIRE 1.35MM (WIRE) ×2 IMPLANT
KIT TURNOVER KIT B (KITS) ×2 IMPLANT
NDL HYPO 21X1.5 SAFETY (NEEDLE) IMPLANT
NEEDLE HYPO 21X1.5 SAFETY (NEEDLE) IMPLANT
NS IRRIG 1000ML POUR BTL (IV SOLUTION) ×2 IMPLANT
PACK ORTHO EXTREMITY (CUSTOM PROCEDURE TRAY) ×1 IMPLANT
PAD ARMBOARD 7.5X6 YLW CONV (MISCELLANEOUS) ×4 IMPLANT
PAD CAST 4YDX4 CTTN HI CHSV (CAST SUPPLIES) IMPLANT
PADDING CAST COTTON 4X4 STRL (CAST SUPPLIES) ×2
PADDING CAST COTTON 6X4 STRL (CAST SUPPLIES) ×1 IMPLANT
PLATE THIRD TUBULAR 8 HOLE (Plate) ×1 IMPLANT
PREVENA RESTOR AXIOFORM 29X28 (GAUZE/BANDAGES/DRESSINGS) ×1 IMPLANT
SCREW CANN 4.0X60 (Screw) ×2 IMPLANT
SCREW LOCK T15 FT 16X3.5XST (Screw) IMPLANT
SCREW LOCKING 3.5X16MM (Screw) ×4 IMPLANT
SCREW LOW PROFILE 3.5X14 (Screw) ×2 IMPLANT
SCREW LOW PROFILE 3.5X16 (Screw) ×1 IMPLANT
SCREW NLOCK T15 FT 18X3.5XST (Screw) IMPLANT
SCREW NON LOCK 3.5X18MM (Screw) ×2 IMPLANT
SCREW NON-LOCKING 3.5X12MM (Screw) ×3 IMPLANT
SPLINT PLASTER CAST XFAST 5X30 (CAST SUPPLIES) IMPLANT
SPLINT PLASTER XFAST SET 5X30 (CAST SUPPLIES) ×2
SUCTION FRAZIER HANDLE 10FR (MISCELLANEOUS) ×2
SUCTION TUBE FRAZIER 10FR DISP (MISCELLANEOUS) ×1 IMPLANT
SUT ETHILON 3 0 PS 1 (SUTURE) ×3 IMPLANT
SUT MON AB 2-0 CT1 36 (SUTURE) ×2 IMPLANT
SUT VIC AB 0 CT1 27 (SUTURE) ×2
SUT VIC AB 0 CT1 27XBRD ANBCTR (SUTURE) IMPLANT
TOWEL GREEN STERILE (TOWEL DISPOSABLE) ×4 IMPLANT
TOWEL GREEN STERILE FF (TOWEL DISPOSABLE) ×2 IMPLANT
UNDERPAD 30X36 HEAVY ABSORB (UNDERPADS AND DIAPERS) ×2 IMPLANT
WATER STERILE IRR 1000ML POUR (IV SOLUTION) ×2 IMPLANT

## 2020-12-31 NOTE — Anesthesia Procedure Notes (Addendum)
Anesthesia Regional Block: Popliteal block   Pre-Anesthetic Checklist: , timeout performed,  Correct Patient, Correct Site, Correct Laterality,  Correct Procedure, Correct Position, site marked,  Risks and benefits discussed,  Surgical consent,  Pre-op evaluation,  At surgeon's request and post-op pain management  Laterality: Right  Prep: chloraprep       Needles:  Injection technique: Single-shot  Needle Type: Echogenic Stimulator Needle     Needle Length: 10cm  Needle Gauge: 20     Additional Needles:   Procedures:,,,, ultrasound used (permanent image in chart),,    Narrative:  Start time: 12/31/2020 10:15 AM End time: 12/31/2020 10:20 AM Injection made incrementally with aspirations every 5 mL.  Performed by: Personally  Anesthesiologist: Mellody Dance, MD  Additional Notes: A functioning IV was confirmed and monitors were applied.  Sterile prep and drape, hand hygiene and sterile gloves were used.  Negative aspiration and test dose prior to incremental administration of local anesthetic. The patient tolerated the procedure well.Ultrasound  guidance: relevant anatomy identified, needle position confirmed, local anesthetic spread visualized around nerve(s), vascular puncture avoided.  Image printed for medical record.

## 2020-12-31 NOTE — Discharge Instructions (Signed)
     Discharge Instructions    Attending Surgeon: Huel Cote, MD Office Phone Number: [phone]   Diagnosis and Procedures:    Surgeries Performed: Right Ankle open reduction internal fixatoin  Discharge Plan:    Diet: Resume usual diet. Begin with light or bland foods.  Drink plenty of fluids.  Activity:  Non-weightbearing, unless otherwise instructed, utilizing crutches, until seen at postoperative Physical Therapy visit this week. Please keep your brace locked until follow-up. You are advised to go home directly from the hospital or surgical center. Restrict your activities.  GENERAL INSTRUCTIONS: 1.  Keep your surgical site elevated above your heart for at least 5-7 days or longer to prevent swelling. This will improve your comfort and your overall recovery following surgery.     2. Please call Dr. Serena Croissant office at [phone] with questions Monday-Friday during business hours. If no one answers, please leave a message and someone should get back to the patient within 24 hours. For emergencies please call 911 or proceed to the emergency room.   3. Patient to notify surgical team if experiences any of the following: Bowel/Bladder dysfunction, uncontrolled pain, nerve/muscle weakness, incision with increased drainage or redness, nausea/vomiting and Fever greater than 101.0 F.  Be alert for signs of infection including redness, streaking, odor, fever or chills. Be alert for excessive pain or bleeding and notify your surgeon immediately.  WOUND INSTRUCTIONS:   Leave your dressing/cast/splint in place until your post operative visit.  Keep it clean and dry.  Always keep the incision clean and dry until the staples/sutures are removed. If there is no drainage from the incision you should keep it open to air. If there is drainage from the incision you must keep it covered at all times until the drainage stops  Do not soak in a bath tub, hot tub, pool, lake or other body  of water until 21 days after your surgery and your incision is completely dry and healed.  If you have removable sutures (or staples) they must be removed 10-14 days (unless otherwise instructed) from the day of your surgery.     1)  Elevate the extremity as much as possible.  2)  Keep the dressing clean and dry.  3)  Please call us if the dressing becomes wet or dirty.  4)  If you are experiencing worsening pain or worsening swelling, please call.     MEDICATIONS: Resume all previous home medications at the previous prescribed dose and frequency unless otherwise noted Start taking the  pain medications on an as-needed basis as prescribed  Please taper down pain medication over the next week following surgery.  Ideally you should not require a refill of any narcotic pain medication.  Take pain medication with food to minimize nausea. In addition to the prescribed pain medication, you may take over-the-counter pain relievers such as Tylenol.  Do NOT take additional tylenol if your pain medication already has tylenol in it.  Aspirin 325mg  daily for four weeks.      FOLLOWUP INSTRUCTIONS: 1. Follow up at the Physical Therapy Clinic 3-4 days following surgery. This appointment should be scheduled unless other arrangements have been made.The Physical Therapy scheduling number is [phone] if an appointment has not already been arranged.  2. Contact Dr. [phone] office during office hours at [phone] or the practice after hours line at [phone] for non-emergencies. For medical emergencies call 911.   Discharge Location: Home

## 2020-12-31 NOTE — Interval H&P Note (Signed)
History and Physical Interval Note:   Cynthia Horne  has presented today for surgery, with the diagnosis of Right Bimalleolar Ankle Fracture.  The various methods of treatment have been discussed with the patient and family. After consideration of risks, benefits and other options for treatment, the patient has consented to  Procedure(s): OPEN REDUCTION INTERNAL FIXATION (ORIF) RIGHT ANKLE FRACTURE (Right) as a surgical intervention.  The patient's history has been reviewed, patient examined, no change in status, stable for surgery.  I have reviewed the patient's chart and labs.  Questions were answered to the patient's satisfaction.     Huel Cote

## 2020-12-31 NOTE — Op Note (Signed)
Date of Surgery: 12/31/2020  INDICATIONS: Cynthia Horne is a 30 y.o.-year-old female with right trimalleolar ankle fracture.  The risk and benefits of the procedure with discussed in detail and documented in the pre-operative evaluation.  PREOPERATIVE DIAGNOSIS: Right trimalleolar ankle fracture  POSTOPERATIVE DIAGNOSIS: Same.  PROCEDURE:  Open reduction internal fixation medial and lateral malleolus right ankle Placement of incisional wound VAC  SURGEON: Cynthia Deeds MD  ASSISTANT: Cynthia Horne, ATC; necessary for the timely completion of procedure and due to complexity of procedure.  ANESTHESIA:  general and block  IV FLUIDS AND URINE: See anesthesia record.  ANTIBIOTICS: Ancef 2 g  ESTIMATED BLOOD LOSS: 25 mL.  IMPLANTS:  Implant Name Type Inv. Item Serial No. Manufacturer Lot No. LRB No. Used Action  SCREW LOW PROFILE 3.5X14 - [id number] Screw SCREW LOW PROFILE 3.5X14  [employer]  Right 2 Implanted  SCREW NON-LOCKING 3.5X12MM - [id number] Screw SCREW NON-LOCKING 3.5X12MM  [employer]  Right 3 Implanted  PLATE THIRD TUBULAR 8 HOLE - [id number] Plate PLATE THIRD TUBULAR 8 HOLE  [employer]  Right 1 Implanted  SCREW LOCKING 3.5X16MM - [id number] Screw SCREW LOCKING 3.5X16MM  [employer]  Right 2 Implanted  SCREW NON LOCK 3.5X18MM - [id number] Screw SCREW NON LOCK 3.5X18MM  [employer]  Right 1 Implanted  SCREW CANN 4.0X60 - [id number] Screw SCREW CANN 4.0X60  [employer]  Right 2 Implanted    DRAINS: None  CULTURES: None  COMPLICATIONS: none  DESCRIPTION OF PROCEDURE:   Patient was identified in the preoperative holding area.  The correct site was marked and confirmed with nursing staff according to universal protocol.  A preoperative block was performed by anesthesia.  The patient was taken back to the operating room.  Again the correct site was confirmed.  The patient was prepped and draped in the usual sterile fashion.  A tourniquet was applied but not inflated.  We began  initially with the approach to the lateral malleolus.  Dissection was carried down sharply through the skin with a 15 blade.  Electrocautery was utilized to cauterize crossing veins.  Metzenbaum scissors were utilized to dissect down to the level of the fracture.  A Glorious Peach was then utilized to elevate and cleared the fracture edges of the surrounding periosteum.  We open the fracture and irrigated thoroughly to remove all hematoma.  We then performed reduction using a pointed clamp between the butterfly and proximal fat fragments.  This achieved anatomic reduction proximally.  A 3.5 mm lag screw was placed by technique.  We then used a 8 hole one third tubular plate in a posterior lateral fashion to reduce the remaining fracture.  This was provisionally held in place by a lobster clamp and the buttress screw was then placed.  Following this fluoroscopy then again confirmed anatomic reduction of the fibula which was out to length.  As result we placed an additional 3 proximal screws in the shaft.  3 screws were placed distally in the distalmost fragment.  We then turned our attention to the medial malleolus.  An incision was made medially about the central portion of the medial malleolus longitudinally.  Again dissection was carried down through skin sharply.  All bleeding branches of the saphenous were cauterized using electrocautery.  The fracture was exposed and the fracture edges were cleaned with a 15 blade.  The medial malleolar fracture was provisionally pinned in place percutaneously using 2 K wires.  We then utilized cannulated screws over these wires after confirming  anatomic reduction and placement with AP and lateral fluoroscopy.  Finally a stress examination was performed on the ankle and mortise view which did not show any gapping of the mortise.  As result no additional mortise fixation was utilized.  The wounds were thoroughly irrigated closed in layers of 0 Vicryl 2-0 Monocryl and 3-0 nylon.  An  incisional wound VAC was placed over the lateral 10 cm incision as well as medial 3 cm incision using a Prevena wound VAC.  A bulky splint was applied.  All counts were correct patient tolerated the procedure well.  Patient was taken to the PACU in hemodynamically stable form.    Cynthia Horne ATC was necessary for opening, closing, retracting, limb positioning and overall facilitation and timely completion of the procedure.     POSTOPERATIVE PLAN: She will be strict nonweightbearing on the right lower extremity.  We have provided her with a prescription for both a wheelchair and a knee scooter to help with mobilization.  She will placed on aspirin 325 mg for DVT prophylaxis.  We will see her back in 1 to 2 weeks for follow-up check.  She will be educated on use of the incisional wound VAC  Cynthia Deeds, MD 1:57 PM

## 2020-12-31 NOTE — Anesthesia Preprocedure Evaluation (Addendum)
Anesthesia Evaluation  Patient identified by MRN, date of birth, ID band Patient awake    Reviewed: Allergy & Precautions, NPO status , Patient's Chart, lab work & pertinent test results  Airway Mallampati: III  TM Distance: >3 FB Neck ROM: Full    Dental no notable dental hx.    Pulmonary former smoker,    Pulmonary exam normal breath sounds clear to auscultation       Cardiovascular Exercise Tolerance: Good negative cardio ROS Normal cardiovascular exam Rhythm:Regular Rate:Normal     Neuro/Psych  Headaches, negative psych ROS   GI/Hepatic negative GI ROS, Neg liver ROS,   Endo/Other  negative endocrine ROS  Renal/GU negative Renal ROS  negative genitourinary   Musculoskeletal negative musculoskeletal ROS (+)   Abdominal   Peds negative pediatric ROS (+)  Hematology  (+) anemia ,   Anesthesia Other Findings   Reproductive/Obstetrics negative OB ROS                             Anesthesia Physical Anesthesia Plan  ASA: 3  Anesthesia Plan: General   Post-op Pain Management: GA combined w/ Regional for post-op pain   Induction: Intravenous  PONV Risk Score and Plan: Treatment may vary due to age or medical condition, Ondansetron, Scopolamine patch - Pre-op and Midazolam  Airway Management Planned: Oral ETT  Additional Equipment: None  Intra-op Plan:   Post-operative Plan: Extubation in OR  Informed Consent: I have reviewed the patients History and Physical, chart, labs and discussed the procedure including the risks, benefits and alternatives for the proposed anesthesia with the patient or authorized representative who has indicated his/her understanding and acceptance.     Dental advisory given  Plan Discussed with: CRNA and Anesthesiologist  Anesthesia Plan Comments: (Patient unable to pee for urine pregnancy test. States she is NOT p There is no formal policy for urine  pregnancy testing prior to surgery per preop charge nurse. Will proceed to surgery given patient needs open fixation of her fracture. Tanna Furry, MD  )      Anesthesia Quick Evaluation

## 2020-12-31 NOTE — Anesthesia Procedure Notes (Signed)
Procedure Name: Intubation Date/Time: 12/31/2020 11:21 AM Performed by: Lanell Matar, CRNA Pre-anesthesia Checklist: Patient identified, Emergency Drugs available, Suction available and Patient being monitored Patient Re-evaluated:Patient Re-evaluated prior to induction Oxygen Delivery Method: Circle System Utilized Preoxygenation: Pre-oxygenation with 100% oxygen Induction Type: IV induction Ventilation: Mask ventilation without difficulty Laryngoscope Size: Miller and 2 Grade View: Grade I Tube type: Oral Tube size: 7.0 mm Number of attempts: 1 Airway Equipment and Method: Stylet and Oral airway Placement Confirmation: ETT inserted through vocal cords under direct vision, positive ETCO2 and breath sounds checked- equal and bilateral Secured at: 22 cm Tube secured with: Tape Dental Injury: Teeth and Oropharynx as per pre-operative assessment  Comments: Metal tongue piercing in place and according to pt cannot be removed. Piercing in place and tongue remains unchanged after intubation.

## 2020-12-31 NOTE — Anesthesia Procedure Notes (Addendum)
Anesthesia Regional Block: Popliteal block   Pre-Anesthetic Checklist: , timeout performed,  Correct Patient, Correct Site, Correct Laterality,  Correct Procedure, Correct Position, site marked,  Risks and benefits discussed,  Surgical consent,  Pre-op evaluation,  At surgeon's request and post-op pain management  Laterality: Right  Prep: chloraprep       Needles:  Injection technique: Single-shot  Needle Type: Echogenic Stimulator Needle     Needle Length: 10cm  Needle Gauge: 20     Additional Needles:   Procedures:,,,, ultrasound used (permanent image in chart),,    Narrative:  Start time: 12/31/2020 10:25 AM End time: 12/31/2020 10:30 AM Injection made incrementally with aspirations every 5 mL.  Performed by: Personally  Anesthesiologist: Mellody Dance, MD  Additional Notes: A functioning IV was confirmed and monitors were applied.  Sterile prep and drape, hand hygiene and sterile gloves were used.  Negative aspiration and test dose prior to incremental administration of local anesthetic. The patient tolerated the procedure well.Ultrasound  guidance: relevant anatomy identified, needle position confirmed, local anesthetic spread visualized around nerve(s), vascular puncture avoided.  Image printed for medical record.

## 2020-12-31 NOTE — Anesthesia Postprocedure Evaluation (Signed)
Anesthesia Post Note  Patient: Cynthia Horne  Procedure(s) Performed: OPEN REDUCTION INTERNAL FIXATION (ORIF) RIGHT ANKLE FRACTURE (Right: Ankle)     Patient location during evaluation: PACU Anesthesia Type: General Level of consciousness: awake and alert Pain management: pain level controlled Vital Signs Assessment: post-procedure vital signs reviewed and stable Respiratory status: spontaneous breathing, nonlabored ventilation and respiratory function stable Cardiovascular status: blood pressure returned to baseline and stable Postop Assessment: no apparent nausea or vomiting Anesthetic complications: no   No notable events documented.  Last Vitals:  Vitals:   12/31/20 1445 12/31/20 1459  BP: 109/83 119/75  Pulse: (!) 103 97  Resp: 17 15  Temp:  36.6 C  SpO2: 100% 99%    Last Pain:  Vitals:   12/31/20 1459  TempSrc:   PainSc: 3                  Candra R Press [place]

## 2020-12-31 NOTE — Transfer of Care (Signed)
Immediate Anesthesia Transfer of Care Note  Patient: Cynthia Horne  Procedure(s) Performed: OPEN REDUCTION INTERNAL FIXATION (ORIF) RIGHT ANKLE FRACTURE (Right: Ankle)  Patient Location: PACU  Anesthesia Type:GA combined with regional for post-op pain  Level of Consciousness: awake, drowsy and patient cooperative  Airway & Oxygen Therapy: Patient Spontanous Breathing and Patient connected to nasal cannula oxygen  Post-op Assessment: Report given to RN, Post -op Vital signs reviewed and stable and Patient moving all extremities X 4  Post vital signs: Reviewed and stable  Last Vitals:  Vitals Value Taken Time  BP 148/78 12/31/20 1345  Temp    Pulse 113 12/31/20 1347  Resp 23 12/31/20 1347  SpO2 98 % 12/31/20 1347  Vitals shown include unvalidated device data.  Last Pain:  Vitals:   12/31/20 0941  TempSrc: Oral         Complications: No notable events documented.

## 2020-12-31 NOTE — Progress Notes (Signed)
Notified Dr. Donavan Foil of pt's Hgb 8.7.HCT 29.7. Also informed Dr. Donavan Foil that I was unable to obtain urine specimen for urine pregnancy test. Pt's LMP was 12/19/20 and pt states she has not had sexual intercourse since April. No orders at this time.

## 2020-12-31 NOTE — Brief Op Note (Signed)
12/31/2020  1:42 PM  PATIENT:  Cynthia Horne  30 y.o. female  PRE-OPERATIVE DIAGNOSIS:  Right Bimalleolar Ankle Fracture  POST-OPERATIVE DIAGNOSIS:  Right Bimalleolar Ankle Fracture  PROCEDURE:  Procedure(s): OPEN REDUCTION INTERNAL FIXATION (ORIF) RIGHT ANKLE FRACTURE (Right)  SURGEON:  Surgeon(s) and Role:    Huel Cote, MD - Primary  PHYSICIAN ASSISTANT:   ASSISTANTS: Kerby Less, LAT, ATC  ANESTHESIA:   general  EBL:  50 mL   BLOOD ADMINISTERED:none  DRAINS: none   LOCAL MEDICATIONS USED:  NONE  SPECIMEN:  No Specimen  DISPOSITION OF SPECIMEN:  N/A  COUNTS:  YES  TOURNIQUET:  * No tourniquets in log *  DICTATION: .Dragon Dictation  PLAN OF CARE: Admit to inpatient   PATIENT DISPOSITION:  PACU - hemodynamically stable.   Delay start of Pharmacological VTE agent (>24hrs) due to surgical blood loss or risk of bleeding: not applicable

## 2021-01-01 ENCOUNTER — Encounter (HOSPITAL_COMMUNITY): Payer: Self-pay | Admitting: Orthopaedic Surgery

## 2021-01-01 ENCOUNTER — Other Ambulatory Visit (HOSPITAL_BASED_OUTPATIENT_CLINIC_OR_DEPARTMENT_OTHER): Payer: Self-pay | Admitting: Orthopaedic Surgery

## 2021-01-01 ENCOUNTER — Encounter (HOSPITAL_BASED_OUTPATIENT_CLINIC_OR_DEPARTMENT_OTHER): Payer: Self-pay | Admitting: Orthopaedic Surgery

## 2021-01-01 DIAGNOSIS — S82841A Displaced bimalleolar fracture of right lower leg, initial encounter for closed fracture: Secondary | ICD-10-CM

## 2021-01-01 NOTE — Addendum Note (Signed)
Addendum  created 01/01/21 1332 by Mellody Dance, MD   Clinical Note Signed, Intraprocedure Blocks edited, SmartForm saved

## 2021-01-02 ENCOUNTER — Telehealth (HOSPITAL_BASED_OUTPATIENT_CLINIC_OR_DEPARTMENT_OTHER): Payer: Self-pay | Admitting: Orthopaedic Surgery

## 2021-01-02 ENCOUNTER — Other Ambulatory Visit (HOSPITAL_BASED_OUTPATIENT_CLINIC_OR_DEPARTMENT_OTHER): Payer: Self-pay | Admitting: Orthopaedic Surgery

## 2021-01-02 MED ORDER — OXYCODONE HCL 5 MG PO TABS: 5.0000 mg | ORAL_TABLET | ORAL | 0 refills | Status: DC | PRN

## 2021-01-02 NOTE — Telephone Encounter (Signed)
Told patient Dr. Steward Drone will follow up on her request.

## 2021-01-02 NOTE — Telephone Encounter (Signed)
Patient asked for information to be faxed to local DME store. Action was completed 12/30/2020

## 2021-01-06 ENCOUNTER — Ambulatory Visit (HOSPITAL_BASED_OUTPATIENT_CLINIC_OR_DEPARTMENT_OTHER): Payer: Medicaid Other | Attending: Orthopaedic Surgery | Admitting: Physical Therapy

## 2021-01-06 ENCOUNTER — Other Ambulatory Visit: Payer: Self-pay

## 2021-01-06 ENCOUNTER — Encounter (HOSPITAL_BASED_OUTPATIENT_CLINIC_OR_DEPARTMENT_OTHER): Payer: Self-pay | Admitting: Physical Therapy

## 2021-01-06 DIAGNOSIS — M25671 Stiffness of right ankle, not elsewhere classified: Secondary | ICD-10-CM | POA: Insufficient documentation

## 2021-01-06 DIAGNOSIS — M25571 Pain in right ankle and joints of right foot: Secondary | ICD-10-CM | POA: Insufficient documentation

## 2021-01-06 DIAGNOSIS — R262 Difficulty in walking, not elsewhere classified: Secondary | ICD-10-CM | POA: Diagnosis present

## 2021-01-06 DIAGNOSIS — M6281 Muscle weakness (generalized): Secondary | ICD-10-CM | POA: Diagnosis present

## 2021-01-07 NOTE — Therapy (Signed)
[facility] [address] [place], [place], [address] Phone: [phone]   Fax:  [phone]  Physical Therapy Evaluation  Patient Details  Name: Cynthia Horne MRN: [id number] Date of Birth: [birth year] Referring Provider (PT): Huel Cote, MD   Encounter Date: 28/13/2022   PT End of Session - 28/13/22 2149     Visit Number 1    Number of Visits 28    Date for PT Re-Evaluation 04/01/21    Authorization Type [employer]    Progress Note Due on Visit --   02/04/21   PT Start Time 2848    PT Stop Time 2828    PT Time Calculation (min) 40 min    Equipment Utilized During Treatment Other (comment)   W/C and bilat crutches   Activity Tolerance Patient tolerated treatment well    Behavior During Therapy [facility] for tasks assessed/performed             Past Medical History:  Diagnosis Date   Anemia    Migraine    Ovarian cyst     Past Surgical History:  Procedure Laterality Date   OOPHORECTOMY Right    ORIF ANKLE FRACTURE Right 12/31/2020   Procedure: OPEN REDUCTION INTERNAL FIXATION (ORIF) RIGHT ANKLE FRACTURE;  Surgeon: Huel Cote, MD;  Location: [facility] OR;  Service: Orthopedics;  Laterality: Right;    There were no vitals filed for this visit.    Subjective Assessment - 01/06/21 0958     Subjective Pt fell on 12/29/20 when she was walking down a wet hill and heard an immediate pop.  Pt was taken by ambulance to ER.  Pt states she sprained her knee and fractured and dislocated her ankle.  At the ER, Pt had x rays and a reduction performed of her ankle dislocation.  Pt underwent ORIF of R ankle medial and lateral malleolus fractures on 12/31/20.  Post op Plan:  strict NWB'ing R LE.  prescription for W/C and knee scooter.  Return to MD in 1-2 weeks.  Her ankle pain is the worst 1st thing in AM when she sits up and leg is hanging down.  Pt has R ankle pain and adjusts herself a lot during the day to prop up ankle and then let  it hang down some due to her pain.  Pt is renting a W/C and is primarily using a W/C at home.  Pt is waiting for insurance to authorize a knee scooter.  Pt is using bilat crutches for a few steps in her home to go to B/R and to get in bed.  Pt is compliant with NWB'ing precautions. Pt is limited with all functional mobility skills including ambulation and transfers.  Pt unable to perform stairs and is limited with standing duration and performing household chores.  She is unable to drive.    Pertinent History Pt is NWB'ing.  Received message from MD:  hip strengthening/work and gait help for first 2 weeks. Splint to be removed next week and place her in CAM boot, then work on PROM and AROM.  NWB for 6 weeks. PMHx:  anemia    Limitations Standing;Walking;House hold activities    How long can you stand comfortably? Very limited    How long can you walk comfortably? unable    Diagnostic tests x rays:  distal tibal and fibula fractures with lateral and posterior dislocation of talus.  negative for knee.    Patient Stated Goals to be able to use R LE  as soon as possible.  to be able to put pressure on it without pain.  return to PLOF.  to be secure on L side    Currently in Pain? Yes    Pain Score 7    10/10 worst; 4-5/10 best   Pain Location Ankle    Pain Orientation Right    Aggravating Factors  leg hanging down    Pain Relieving Factors medications                [facility] PT Assessment - 01/07/21 0001       Assessment   Medical Diagnosis s/p ORIF for bimalleolar R ankle fracture   See referral under "other orders"   Referring Provider (PT) Huel Cote, MD    Onset Date/Surgical Date 12/31/20   surgical date   Next MD Visit 01/15/21      Precautions   Precautions --   per MD orders and healing constraints per surgery.  MD message indicated hip strengthening/work and gait help for first 2 weeks.  PROM and AROM after splint is removed.  NWB'ing for 6 weeks   Required Braces or Orthoses  Other Brace/Splint    Other Brace/Splint Splint on R distal LE/ankle      Restrictions   Weight Bearing Restrictions Yes    RLE Weight Bearing Non weight bearing      Home Environment   Living Environment Private residence    Additional Comments 1 story apartment.  no steps, but a large hill to ascend      Prior Function   Level of Independence Independent   Pt was able to perform all of her normal functional mobility skills including ambulation, transfers, and stairs without difficulty or pain.  Pt ambulated without an AD.   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  Pt's mother thought that pt may have a cast and splint.  ACE wrap  around distal R LE and ankle.  wound vac present.  Pt is in W/C and brought bilat crutches.    Other Surveys  Lower Extremity Functional Scale    Lower Extremity Functional Scale  4/80      ROM / Strength   AROM / PROM / Strength AROM      AROM   AROM Assessment Site Ankle    Right/Left Ankle Left    Left Ankle Dorsiflexion 18    Left Ankle Plantar Flexion 52    Left Ankle Inversion 29    Left Ankle Eversion 14      Transfers   Comments Pt able to transer from W/C to table safely independently while maintaining NWB'ing precautions.      Ambulation/Gait   Gait Comments PT instructed pt in hop to gait pattern and NWB'ing restrictions.  PT adjusted handles of crutch to appropriate height.  Pt was able to perform a hop to gait pattern with bilat crutches for approx 8-10 ft with SBA/CGA                        Objective measurements completed on examination: See above findings.  PT Education - 01/06/21 2119      Education Details Educated pt and mother concerning relevant anatomy and POC.  Instructed pt in NWB'ing restrictions and instructed her to remain compliant.  Answered Pt and mother's questions.  Pt's mother asked to check the fitting for crutches.  Educated pt and mother with proper height and fit for crutches.  PT adjusted crutch handles to appropriate height and pt stated it felt better.  PT demonstrated and Instructed pt in hop to gait pattern with bilat crutches and maintaining NWB'ing.    Person(s) Educated Patient;Parent(s)    Methods Explanation;Demonstration;Verbal cues    Comprehension Verbalized understanding;Returned demonstration              PT Short Term Goals - 01/07/21 0835       PT SHORT TERM GOAL #1   Title Pt will be independent and compliant with HEP for improved pain, strength, ROM, and function.    Time 4    Period Weeks    Status New    Target Date 02/03/21      PT SHORT TERM GOAL #2   Title Pt will perform daily mobility safely while maintaining NWB'ing restrictions    Time 6    Period Weeks    Status New    Target Date 02/17/21      PT SHORT TERM GOAL #3   Title Pt will demo R anke AROM/PROM to be at least 5/10 deg in DF, 35/40 deg in PF, 15/20 deg in inversion, and 7/10 deg in eversion for improved ankle stiffness and mobility.    Time 6    Period Weeks    Status New    Target Date 02/17/21      PT SHORT TERM GOAL #4   Title Pt will demo at least 12 deg of DF AROM for improved stiffness and gait.    Time 9    Period Weeks    Status New    Target Date 03/10/21      PT SHORT TERM GOAL #5   Title Pt will progress with WB'ing per MD orders without adverse effects    Time 8    Period Weeks    Status New    Target Date 03/03/21      Additional Short Term Goals   Additional Short Term Goals Yes      PT SHORT TERM GOAL #6   Title Pt will ambulate short to intermediate distance without AD without significant pain or difficulty.    Time 11     Period Weeks    Status New    Target Date 03/24/21      PT SHORT TERM GOAL #7   Title Pt will be able to perform her normal daily transfers without difficulty.    Time 11    Period Weeks    Status New    Target Date 03/24/21               PT Long Term Goals - 01/07/21 1423       PT LONG TERM GOAL #1   Title Pt will ambulate with a normalized heel to toe gait pattern without limping.    Time 16    Period Weeks    Status New    Target Date 04/28/21      PT LONG TERM GOAL #2   Title Pt will ambulate extended community distance without an AD without increased pain or difficulty.    Time 16  Period Weeks    Status New    Target Date 04/28/21      PT LONG TERM GOAL #3   Title Pt will be able to perform stairs with a reciprocal gait without using the rail.    Time 16    Period Weeks    Status New    Target Date 04/28/21      PT LONG TERM GOAL #4   Title Pt will demo 5/5 strength in R ankle DF, Inv, and Eve and WFL for PF tested in sitting for improved tolerance to and performance of functional mobility skills.    Time 16    Period Weeks    Status New    Target Date 04/28/21      PT LONG TERM GOAL #5   Title Pt will be able to ambulate up her hill to enter her home without significant pain or difficulty.    Time 16    Period Weeks    Status New    Target Date 04/28/21      Additional Long Term Goals   Additional Long Term Goals Yes      PT LONG TERM GOAL #6   Title Pt will be able to perform her normal standing activities and IADLs including household chores without significant pain or limitation.    Time 16    Period Weeks    Status New    Target Date 04/28/21                    Plan - 01/06/21 0944     Clinical Impression Statement Evaluation began late due to pt arriving 12 mins late.  Pt is a 30 y/o female 6 days s/p ORIF of R ankle medial and lateral malleolus fractures presenting to the clinic with expected post op findings of R ankle  pain, muscle weakness in R LE, difficulty in walking, and limited mobility.  Pt is NWB'ing for 6 weeks and is primarily using W/C at home though has used crutches for a few steps in her home to go to B/R and to get in bed.  Pt is compliant with NWB'ing precautions.  Pt was instructed in using crutches appropriately with NWB'ing precautions and also educated in appropriate crutch fit.  Pt is waiting for insurance to authorize a knee scooter.  Her ankle pain is the worst 1st thing in AM when she sits up and leg is hanging down.  Pt is limited with all functional mobility skills including ambulation and transfers.  Pt is unable to perform stairs and is limited with standing duration and performing household chores. She is unable to drive.  ROM not assessed due to healing constraints of recent surgery.  Pt should benefit from skilled PT services to address goals, improve pain, ROM, and strength, and to restore functional mobility.    Examination-Activity Limitations Stairs;Squat;Sit;Dressing;Carry;Stand;Locomotion Level;Transfers    Examination-Participation Restrictions Cleaning;Community Activity;Driving;Laundry    Stability/Clinical Decision Making Stable/Uncomplicated    Clinical Decision Making Low    Rehab Potential Good    PT Frequency --   1x/wk x 4 weeks, 1-2x/wk x 2 weeks, and 2x/wk x 10 weeks   PT Duration --   16 weeks   PT Treatment/Interventions ADLs/Self Care Home Management;Cryotherapy;Electrical Stimulation;DME Instruction;Gait training;Stair training;Functional mobility training;Therapeutic activities;Therapeutic exercise;Balance training;Patient/family education;Neuromuscular re-education;Manual techniques;Scar mobilization;Passive range of motion;Vasopneumatic Device    PT Next Visit Plan Pt is NWB'ing for 6 weeks.  Received message from MD: hip strengthening/work and gait help  for first 2 weeks. Splint to be removed next week and place her in CAM boot, then work on PROM and AROM.    PT  Home Exercise Plan Will give HEP next visit including quad sets    Consulted and Agree with Plan of Care Patient             Patient will benefit from skilled therapeutic intervention in order to improve the following deficits and impairments:     Visit Diagnosis: Pain in right ankle and joints of right foot  Muscle weakness (generalized)  Difficulty in walking, not elsewhere classified  Stiffness of right ankle, not elsewhere classified     Problem List Patient Active Problem List   Diagnosis Date Noted   Migraine headache with aura 11/05/2015    Audie Clear III PT, DPT 01/07/21 2:33 PM   [facility] [address] [place], [place], [address] Phone: [phone]   Fax:  [phone]  Name: SHERRICE CREEKMORE MRN: [id number] Date of Birth: 10/13/1990

## 2021-01-09 ENCOUNTER — Other Ambulatory Visit (HOSPITAL_BASED_OUTPATIENT_CLINIC_OR_DEPARTMENT_OTHER): Payer: Self-pay

## 2021-01-12 ENCOUNTER — Telehealth: Payer: Self-pay

## 2021-01-12 NOTE — Telephone Encounter (Signed)
Pt would like a call back regarding her apt for the neurologist tomorrow. She wanted to know if Dr. Steward Drone still wanted her to go.   Please advise
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
## 2021-01-13 ENCOUNTER — Other Ambulatory Visit (HOSPITAL_BASED_OUTPATIENT_CLINIC_OR_DEPARTMENT_OTHER): Payer: Self-pay | Admitting: Orthopaedic Surgery

## 2021-01-13 ENCOUNTER — Ambulatory Visit: Payer: Medicaid Other | Admitting: Neurology

## 2021-01-13 MED ORDER — METHOCARBAMOL 500 MG PO TABS: 500.0000 mg | ORAL_TABLET | Freq: Four times a day (QID) | ORAL | 2 refills | Status: DC

## 2021-01-14 ENCOUNTER — Other Ambulatory Visit (HOSPITAL_BASED_OUTPATIENT_CLINIC_OR_DEPARTMENT_OTHER): Payer: Self-pay | Admitting: Orthopaedic Surgery

## 2021-01-14 DIAGNOSIS — S82841A Displaced bimalleolar fracture of right lower leg, initial encounter for closed fracture: Secondary | ICD-10-CM

## 2021-01-15 ENCOUNTER — Encounter (HOSPITAL_BASED_OUTPATIENT_CLINIC_OR_DEPARTMENT_OTHER): Payer: Medicaid Other | Admitting: Orthopaedic Surgery

## 2021-01-16 ENCOUNTER — Other Ambulatory Visit: Payer: Self-pay

## 2021-01-16 ENCOUNTER — Ambulatory Visit (HOSPITAL_BASED_OUTPATIENT_CLINIC_OR_DEPARTMENT_OTHER): Payer: Medicaid Other | Admitting: Physical Therapy

## 2021-01-16 ENCOUNTER — Ambulatory Visit (HOSPITAL_BASED_OUTPATIENT_CLINIC_OR_DEPARTMENT_OTHER)
Admission: RE | Admit: 2021-01-16 | Discharge: 2021-01-16 | Disposition: A | Discharge status: Home / Self Care | Payer: Medicaid Other | Source: Ambulatory Visit | Attending: Orthopaedic Surgery | Admitting: Orthopaedic Surgery

## 2021-01-16 ENCOUNTER — Other Ambulatory Visit (HOSPITAL_BASED_OUTPATIENT_CLINIC_OR_DEPARTMENT_OTHER): Payer: Self-pay

## 2021-01-16 ENCOUNTER — Ambulatory Visit (INDEPENDENT_AMBULATORY_CARE_PROVIDER_SITE_OTHER): Payer: Medicaid Other | Admitting: Orthopaedic Surgery

## 2021-01-16 DIAGNOSIS — S82841A Displaced bimalleolar fracture of right lower leg, initial encounter for closed fracture: Secondary | ICD-10-CM

## 2021-01-16 MED ORDER — CEPHALEXIN 500 MG PO CAPS
500.0000 mg | ORAL_CAPSULE | Freq: Four times a day (QID) | ORAL | 0 refills | Status: AC
  Filled 2021-01-16: qty 20, 5d supply, fill #0

## 2021-01-16 NOTE — Progress Notes (Signed)
Post Operative Evaluation    Procedure/Date of Surgery: 12/31/20  Interval History:  Presents today for follow-up status post right ankle ORIF.  She has been compliant with nonweightbearing status.  She has been utilizing her wheelchair at home.  Her pain is slowly improving.  She is having some difficulty sleeping.  She has been compliant with anticoagulation.  Denies any fevers or chills   PMH/PSH/Family History/Social History/Meds/Allergies:    Past Medical History:  Diagnosis Date   Anemia    Migraine    Ovarian cyst    Past Surgical History:  Procedure Laterality Date   OOPHORECTOMY Right    ORIF ANKLE FRACTURE Right 12/31/2020   Procedure: OPEN REDUCTION INTERNAL FIXATION (ORIF) RIGHT ANKLE FRACTURE;  Surgeon: Huel Cote, MD;  Location: [facility] OR;  Service: Orthopedics;  Laterality: Right;   Social History   Socioeconomic History   Marital status: Single    Spouse name: Not on file   Number of children: 1   Years of education: Not on file   Highest education level: Some college, no degree  Occupational History   Occupation: Company secretary  Tobacco Use   Smoking status: Former    Packs/day: 0.50    Types: Cigarettes, Cigars   Smokeless tobacco: Former   Tobacco comments:    quit with preg  Vaping Use   Vaping Use: Never used  Substance and Sexual Activity   Alcohol use: No   Drug use: No   Sexual activity: Yes    Birth control/protection: None  Other Topics Concern   Not on file  Social History Narrative   Patient is right-handed.She lives in a one story house. Seh drinks 2 cups of coffee a day, and a glass of tea or soda  QOD. She walks extensively at work.   Social Determinants of Health   Financial Resource Strain: Not on file  Food Insecurity: Not on file  Transportation Needs: Not on file  Physical Activity: Not on file  Stress: Not on file  Social Connections: Not on file   Family History  Problem Relation  Age of Onset   Hypertension Maternal Grandmother    Cancer Maternal Grandfather        pancreatic   No Known Allergies Current Outpatient Medications  Medication Sig Dispense Refill   acetaminophen (TYLENOL) 500 MG tablet Take 1 tablet (500 mg total) by mouth every 8 (eight) hours for 10 days. 100 tablet 0   aspirin EC 325 MG tablet Take 1 tablet (325 mg total) by mouth daily for 30 days. 100 tablet 0   cetirizine (ZYRTEC) 10 MG tablet Take 10 mg by mouth every other day.     methocarbamol (ROBAXIN) 500 MG tablet Take 1 tablet (500 mg total) by mouth 4 (four) times daily. 30 tablet 2   oxyCODONE (OXY IR/ROXICODONE) 5 MG immediate release tablet Take 1 tablet (5 mg total) by mouth every 4 (four) hours as needed (severe pain). 20 tablet 0   No current facility-administered medications for this visit.   No results found.  Review of Systems:   A ROS was performed including pertinent positives and negatives as documented in the HPI.   Musculoskeletal Exam:    Incisions are well-healing.  There is 1 small area of serous staining drainage distally.  There is no frank purulence  or redness at the site.  Sensation is intact in all sensory distributions of the right foot.  She is able to dorsiflex all of the toes as well as plantarflex.  Range of motion testing otherwise deferred due to fracture   Imaging:    Status post open reduction internal fixation of right ankle without any evidence of hardware failure  I personally reviewed and interpreted the radiographs.   Assessment:   30 year old female 2-week status post right ankle ORIF overall doing well.  Given the small amount of serosanguineous drainage I have advised that I would like to put her on a 5-day course of Keflex.  If anything this is per cautious although I would like to be conservative here.  She will continue to be nonweightbearing.  We will transition her to a cam boot today.  We will provide her with dressing changes supplies  so that she can perform these.  Plan :    -Sutures removed today -Transition to cam boot today -5 days of Keflex provided -Follow-up in 2 weeks   I personally saw and evaluated the patient, and participated in the management and treatment plan.  Huel Cote, MD Attending Physician, Orthopedic Surgery  This document was dictated using Dragon voice recognition software. A reasonable attempt at proof reading has been made to minimize errors.

## 2021-01-19 ENCOUNTER — Ambulatory Visit (HOSPITAL_BASED_OUTPATIENT_CLINIC_OR_DEPARTMENT_OTHER): Payer: Self-pay | Admitting: Orthopaedic Surgery

## 2021-01-19 ENCOUNTER — Other Ambulatory Visit (HOSPITAL_BASED_OUTPATIENT_CLINIC_OR_DEPARTMENT_OTHER): Payer: Self-pay | Admitting: Orthopaedic Surgery

## 2021-01-19 ENCOUNTER — Telehealth (HOSPITAL_BASED_OUTPATIENT_CLINIC_OR_DEPARTMENT_OTHER): Payer: Self-pay | Admitting: Orthopaedic Surgery

## 2021-01-19 ENCOUNTER — Telehealth: Payer: Self-pay | Admitting: Orthopaedic Surgery

## 2021-01-19 MED ORDER — IBUPROFEN 800 MG PO TABS: 800.0000 mg | ORAL_TABLET | Freq: Three times a day (TID) | ORAL | 3 refills | Status: AC

## 2021-01-19 MED ORDER — OXYCODONE HCL 5 MG PO CAPS: 5.0000 mg | ORAL_CAPSULE | ORAL | 0 refills | Status: DC | PRN

## 2021-01-19 NOTE — Telephone Encounter (Signed)
Pt wants refill on 800 mg ibuprofen

## 2021-01-19 NOTE — Telephone Encounter (Signed)
Pt called and also wants a refill on oxycodone. Also want nurse to call her.   CB [phone]

## 2021-01-20 ENCOUNTER — Telehealth: Payer: Self-pay

## 2021-01-20 NOTE — Telephone Encounter (Signed)
Patient was given Dr. Serena Croissant office number to call.

## 2021-01-20 NOTE — Telephone Encounter (Signed)
Discussed with pt Dr. Steward Drone will visit her in PT on Friday 01/23/21

## 2021-01-21 ENCOUNTER — Other Ambulatory Visit: Payer: Self-pay

## 2021-01-21 ENCOUNTER — Ambulatory Visit (INDEPENDENT_AMBULATORY_CARE_PROVIDER_SITE_OTHER): Payer: Medicaid Other | Admitting: Orthopaedic Surgery

## 2021-01-21 VITALS — Ht 65.0 in | Wt 290.0 lb

## 2021-01-21 DIAGNOSIS — S82841A Displaced bimalleolar fracture of right lower leg, initial encounter for closed fracture: Secondary | ICD-10-CM

## 2021-01-22 NOTE — Progress Notes (Signed)
Post Operative Evaluation    Procedure/Date of Surgery: 12/31/20  Interval History:  Presents today for follow-up status post right ankle ORIF.  She has been compliant with nonweightbearing status.  She has been utilizing her wheelchair at home.  Her pain is slowly improving. Presents today with complaints of drainage from the wound.  PMH/PSH/Family History/Social History/Meds/Allergies:    Past Medical History:  Diagnosis Date   Anemia    Migraine    Ovarian cyst    Past Surgical History:  Procedure Laterality Date   OOPHORECTOMY Right    ORIF ANKLE FRACTURE Right 12/31/2020   Procedure: OPEN REDUCTION INTERNAL FIXATION (ORIF) RIGHT ANKLE FRACTURE;  Surgeon: Huel Cote, MD;  Location: [facility] OR;  Service: Orthopedics;  Laterality: Right;   Social History   Socioeconomic History   Marital status: Single    Spouse name: Not on file   Number of children: 1   Years of education: Not on file   Highest education level: Some college, no degree  Occupational History   Occupation: Company secretary  Tobacco Use   Smoking status: Former    Packs/day: 0.50    Types: Cigarettes, Cigars   Smokeless tobacco: Former   Tobacco comments:    quit with preg  Vaping Use   Vaping Use: Never used  Substance and Sexual Activity   Alcohol use: No   Drug use: No   Sexual activity: Yes    Birth control/protection: None  Other Topics Concern   Not on file  Social History Narrative   Patient is right-handed.She lives in a one story house. Seh drinks 2 cups of coffee a day, and a glass of tea or soda  QOD. She walks extensively at work.   Social Determinants of Health   Financial Resource Strain: Not on file  Food Insecurity: Not on file  Transportation Needs: Not on file  Physical Activity: Not on file  Stress: Not on file  Social Connections: Not on file   Family History  Problem Relation Age of Onset   Hypertension Maternal Grandmother    Cancer  Maternal Grandfather        pancreatic   No Known Allergies Current Outpatient Medications  Medication Sig Dispense Refill   acetaminophen (TYLENOL) 500 MG tablet Take 1 tablet (500 mg total) by mouth every 8 (eight) hours for 10 days. 100 tablet 0   aspirin EC 325 MG tablet Take 1 tablet (325 mg total) by mouth daily for 30 days. 100 tablet 0   cetirizine (ZYRTEC) 10 MG tablet Take 10 mg by mouth every other day.     ibuprofen (ADVIL) 800 MG tablet Take 1 tablet (800 mg total) by mouth every 8 (eight) hours for 10 days. Please take with food, please alternate with acetaminophen 30 tablet 3   methocarbamol (ROBAXIN) 500 MG tablet Take 1 tablet (500 mg total) by mouth 4 (four) times daily. 30 tablet 2   oxyCODONE (OXY IR/ROXICODONE) 5 MG immediate release tablet Take 1 tablet (5 mg total) by mouth every 4 (four) hours as needed (severe pain). 20 tablet 0   oxycodone (OXY-IR) 5 MG capsule Take 1 capsule (5 mg total) by mouth every 4 (four) hours as needed (severe pain). 5 capsule 0   No current facility-administered medications for this visit.   No results found.  Review of Systems:   A ROS was performed including pertinent positives and negatives as documented in the HPI.   Musculoskeletal Exam:    Incisions are well-healing.  There is 1 small area of serous staining drainage distally on the lateral incision.  This area is healing by granulation/secondary intention. There is no exposed hardware. There is no purulence. There is no surround redness or erythema.   Imaging:     I personally reviewed and interpreted the radiographs.   Assessment:   30 year old female 3-week status post right ankle ORIF overall doing well.  Lateral wound is healing with secondary intention distally. I am not concerned for infection at this time. I will plan to see her back weekly for dressing care. She will continue damp to dry dressing changes 1-2 times daily. I have described that this type of healing is  not uncommon in the setting of an ankle dislocation given the traumatize nature of the soft tissue. WE will continue to keep a close watch on the wound.  Plan :    -Finish keflex prescription -Damp to dry dressing changes -Weekly visit for wound care   I personally saw and evaluated the patient, and participated in the management and treatment plan.  Huel Cote, MD Attending Physician, Orthopedic Surgery  This document was dictated using Dragon voice recognition software. A reasonable attempt at proof reading has been made to minimize errors.

## 2021-01-23 ENCOUNTER — Ambulatory Visit (HOSPITAL_BASED_OUTPATIENT_CLINIC_OR_DEPARTMENT_OTHER): Payer: Medicaid Other | Admitting: Physical Therapy

## 2021-01-28 ENCOUNTER — Encounter (HOSPITAL_BASED_OUTPATIENT_CLINIC_OR_DEPARTMENT_OTHER): Payer: Self-pay | Admitting: Physical Therapy

## 2021-01-28 ENCOUNTER — Ambulatory Visit (HOSPITAL_BASED_OUTPATIENT_CLINIC_OR_DEPARTMENT_OTHER): Payer: Medicaid Other | Attending: Orthopaedic Surgery | Admitting: Physical Therapy

## 2021-01-28 ENCOUNTER — Other Ambulatory Visit: Payer: Self-pay

## 2021-01-28 DIAGNOSIS — M6281 Muscle weakness (generalized): Secondary | ICD-10-CM | POA: Insufficient documentation

## 2021-01-28 DIAGNOSIS — M25571 Pain in right ankle and joints of right foot: Secondary | ICD-10-CM | POA: Diagnosis not present

## 2021-01-28 DIAGNOSIS — M25671 Stiffness of right ankle, not elsewhere classified: Secondary | ICD-10-CM | POA: Diagnosis present

## 2021-01-28 DIAGNOSIS — R262 Difficulty in walking, not elsewhere classified: Secondary | ICD-10-CM | POA: Insufficient documentation

## 2021-01-28 NOTE — Therapy (Signed)
[facility] [address] [place], [place], [address] Phone: [phone]   Fax:  [phone]  Physical Therapy Treatment  Patient Details  Name: Cynthia Horne MRN: [id number] Date of Birth: 08/09/1990 Referring Provider (PT): Huel Cote, MD   Encounter Date: 01/28/2021   PT End of Session - 01/28/21 1035     Visit Number 2    Number of Visits 28    Date for PT Re-Evaluation 04/01/21    Authorization Type [employer] Medicaid    Progress Note Due on Visit --   02/04/2021   PT Start Time 0950    PT Stop Time 1030    PT Time Calculation (min) 40 min    Activity Tolerance Patient tolerated treatment well    Behavior During Therapy [facility] for tasks assessed/performed             Past Medical History:  Diagnosis Date   Anemia    Migraine    Ovarian cyst     Past Surgical History:  Procedure Laterality Date   OOPHORECTOMY Right    ORIF ANKLE FRACTURE Right 12/31/2020   Procedure: OPEN REDUCTION INTERNAL FIXATION (ORIF) RIGHT ANKLE FRACTURE;  Surgeon: Huel Cote, MD;  Location: [facility] OR;  Service: Orthopedics;  Laterality: Right;    There were no vitals filed for this visit.   Subjective Assessment - 01/28/21 0951     Subjective Pt is 4 weeks s/p R ankle ORIF for bimalleolar fracture.  Pt states she is not taking oxycodone because her pain is much less.  Pt is using knee scooter and is compliant with NWB'ing.  Pt saw MD last week due to having drainage on lateral incision.  Pt states MD didn't think it was infected but put pt on antibiotics for precautions.  MD instructed pt to use damp to dry dressings and pt has been using it consistently.  Pt states MD wanted to check drainage when she came to PT.  Pt states she can have pain with the cold weather.   Pt states MD gave her ankle pumps for home exercises and she has improved with ROM.  Pt is performing AP's 3 times per day for 10 reps each.    Pertinent History Pt is NWB'ing.   Received message from MD:  hip strengthening/work and gait help for first 2 weeks. work on PROM and AROM when pt is CAM boot. NWB for 6 weeks. PMHx:  anemia    Currently in Pain? No/denies                [facility] PT Assessment - 01/28/21 0001       Observation/Other Assessments-Edema    Edema --   pt in CAM walker boot.  Pt had minimal drainage on gauze at lateral incision                          [facility] Adult PT Treatment/Exercise - 01/28/21 0001       Exercises   Exercises Ankle;Knee/Hip   Reviewed current function, pain level, pt presentation, and Pt reported info from MD.     Knee/Hip Exercises: Supine   Other Supine Knee/Hip Exercises Quad sets and Glute sets with 5 sec hold x 10-12 reps each in longsitting. Supine SLR 2x10 reps.  Pt demonstrated exercise (APs) from MD.    Other Supine Knee/Hip Exercises Pt received gentle R ankle PROM in DF, PF, Inv, and Eve w/n pt tolerance and jt stiffness  PT Education - 01/28/21 1034     Education Details Instructed pt to cont per Dr. Steward Drone orders of dressings and wound care.  instructed pt to continue to monitor drainage and to cont NWB'ing precautions.  Gave pt a HEP handout and educated pt in correct form and appropriate frequency.  Instructed pt she should not have ankle or foot pain with HEP.  Access Code: [email or web address]  URL: [email or web address]  Date: 01/28/2021  Prepared by: Aaron Edelman    Exercises  Supine Active Straight Leg Raise - 1 x daily - 6-7 x weekly - 2 sets - 10 reps  Supine Quadricep Sets - 2 x daily - 7 x weekly - 2 sets - 10 reps - 5 seconds hold  Supine Gluteal Sets - 2 x daily - 7 x weekly - 2 sets - 10 reps    Person(s) Educated Patient    Methods Explanation;Demonstration;Handout;Verbal cues;Tactile cues    Comprehension Verbalized understanding;Returned demonstration              PT Short Term Goals - 01/07/21 0835       PT SHORT TERM GOAL #1    Title Pt will be independent and compliant with HEP for improved pain, strength, ROM, and function.    Time 4    Period Weeks    Status New    Target Date 02/03/21      PT SHORT TERM GOAL #2   Title Pt will perform daily mobility safely while maintaining NWB'ing restrictions    Time 6    Period Weeks    Status New    Target Date 02/17/21      PT SHORT TERM GOAL #3   Title Pt will demo R anke AROM/PROM to be at least 5/10 deg in DF, 35/40 deg in PF, 15/20 deg in inversion, and 7/10 deg in eversion for improved ankle stiffness and mobility.    Time 6    Period Weeks    Status New    Target Date 02/17/21      PT SHORT TERM GOAL #4   Title Pt will demo at least 12 deg of DF AROM for improved stiffness and gait.    Time 9    Period Weeks    Status New    Target Date 03/10/21      PT SHORT TERM GOAL #5   Title Pt will progress with WB'ing per MD orders without adverse effects    Time 8    Period Weeks    Status New    Target Date 03/03/21      Additional Short Term Goals   Additional Short Term Goals Yes      PT SHORT TERM GOAL #6   Title Pt will ambulate short to intermediate distance without AD without significant pain or difficulty.    Time 11    Period Weeks    Status New    Target Date 03/24/21      PT SHORT TERM GOAL #7   Title Pt will be able to perform her normal daily transfers without difficulty.    Time 11    Period Weeks    Status New    Target Date 03/24/21               PT Long Term Goals - 01/07/21 1423       PT LONG TERM GOAL #1   Title Pt will ambulate with a normalized heel to toe gait pattern without limping.  Time 16    Period Weeks    Status New    Target Date 04/28/21      PT LONG TERM GOAL #2   Title Pt will ambulate extended community distance without an AD without increased pain or difficulty.    Time 16    Period Weeks    Status New    Target Date 04/28/21      PT LONG TERM GOAL #3   Title Pt will be able to perform  stairs with a reciprocal gait without using the rail.    Time 16    Period Weeks    Status New    Target Date 04/28/21      PT LONG TERM GOAL #4   Title Pt will demo 5/5 strength in R ankle DF, Inv, and Eve and WFL for PF tested in sitting for improved tolerance to and performance of functional mobility skills.    Time 16    Period Weeks    Status New    Target Date 04/28/21      PT LONG TERM GOAL #5   Title Pt will be able to ambulate up her hill to enter her home without significant pain or difficulty.    Time 16    Period Weeks    Status New    Target Date 04/28/21      Additional Long Term Goals   Additional Long Term Goals Yes      PT LONG TERM GOAL #6   Title Pt will be able to perform her normal standing activities and IADLs including household chores without significant pain or limitation.    Time 16    Period Weeks    Status New    Target Date 04/28/21                   Plan - 01/28/21 1031     Clinical Impression Statement Pt presents to Rx reporting improved pain since prior Rx.  Pt is using the knee scooter and is compliant with NWB'ing precautions.  Pt has had some issues with drainage at lateral incision and has seen MD multiple times.  She is following MD instructions and using damp to dry dressing consistently.  Pt reports drainage has improved.  PT tried to find Dr. Steward Drone or AT upstairs though Dr. Steward Drone is in surgery today and had no one in the office.  PT removed ACE wrap and assessed R ankle.  She had minimal drainage at lateral incision, nothing excessive.  Pt states that's much better now.  PT performed gentle PROM to R ankle and pt tolerated PROM very well and had no pain.  Pt demonstrates ankle pumps with hold that was given to her by MD and she performed well without any issues.  Pt was given HEP today.  She responded well to Rx having no pain and no c/o's after Rx. Pt should benefit from cont skilled PT services to address goals and to restore  PLOF.    PT Treatment/Interventions ADLs/Self Care Home Management;Cryotherapy;Electrical Stimulation;DME Instruction;Gait training;Stair training;Functional mobility training;Therapeutic activities;Therapeutic exercise;Balance training;Patient/family education;Neuromuscular re-education;Manual techniques;Scar mobilization;Passive range of motion;Vasopneumatic Device    PT Next Visit Plan Pt is NWB'ing for 6 weeks.  Received message from MD: hip strengthening/work and gait help for first 2 weeks.  PROM and AROM when pt is placed in CAM walker boot.  Review HEP.  Gentle ROM.    PT Home Exercise Plan Access Code: [email or web address]  URL: [email or web address]  Date:  01/28/2021  Prepared by: Aaron Edelman    Exercises  Supine Active Straight Leg Raise - 1 x daily - 6-7 x weekly - 2 sets - 10 reps  Supine Quadricep Sets - 2 x daily - 7 x weekly - 2 sets - 10 reps - 5 seconds hold  Supine Gluteal Sets - 2 x daily - 7 x weekly - 2 sets - 10 reps    Consulted and Agree with Plan of Care Patient             Patient will benefit from skilled therapeutic intervention in order to improve the following deficits and impairments:  Abnormal gait, Decreased activity tolerance, Decreased balance, Decreased mobility, Decreased endurance, Decreased range of motion, Decreased strength, Increased edema, Hypomobility, Difficulty walking, Impaired flexibility, Pain  Visit Diagnosis: Pain in right ankle and joints of right foot  Muscle weakness (generalized)  Difficulty in walking, not elsewhere classified  Stiffness of right ankle, not elsewhere classified     Problem List Patient Active Problem List   Diagnosis Date Noted   Migraine headache with aura 11/05/2015    Audie Clear III PT, DPT 01/28/21 5:10 PM   [facility] [address] [place], [place], [address] Phone: [phone]   Fax:  [phone]  Name: Cynthia Horne MRN:  [id number] Date of Birth: 02/02/1991

## 2021-01-30 ENCOUNTER — Encounter (HOSPITAL_BASED_OUTPATIENT_CLINIC_OR_DEPARTMENT_OTHER): Payer: Medicaid Other | Admitting: Orthopaedic Surgery

## 2021-02-05 ENCOUNTER — Other Ambulatory Visit (HOSPITAL_BASED_OUTPATIENT_CLINIC_OR_DEPARTMENT_OTHER): Payer: Self-pay | Admitting: Orthopaedic Surgery

## 2021-02-05 DIAGNOSIS — S82841A Displaced bimalleolar fracture of right lower leg, initial encounter for closed fracture: Secondary | ICD-10-CM

## 2021-02-06 ENCOUNTER — Ambulatory Visit (INDEPENDENT_AMBULATORY_CARE_PROVIDER_SITE_OTHER): Payer: Medicaid Other | Admitting: Orthopaedic Surgery

## 2021-02-06 ENCOUNTER — Other Ambulatory Visit: Payer: Self-pay

## 2021-02-06 ENCOUNTER — Ambulatory Visit (HOSPITAL_BASED_OUTPATIENT_CLINIC_OR_DEPARTMENT_OTHER)
Admission: RE | Admit: 2021-02-06 | Discharge: 2021-02-06 | Disposition: A | Discharge status: Home / Self Care | Payer: Medicaid Other | Source: Ambulatory Visit | Attending: Orthopaedic Surgery | Admitting: Orthopaedic Surgery

## 2021-02-06 VITALS — Ht 65.0 in | Wt 290.0 lb

## 2021-02-06 DIAGNOSIS — S82841A Displaced bimalleolar fracture of right lower leg, initial encounter for closed fracture: Secondary | ICD-10-CM | POA: Insufficient documentation

## 2021-02-06 NOTE — Progress Notes (Signed)
Post Operative Evaluation    Procedure/Date of Surgery: 12/31/20  Interval History:  Presents today for follow-up status post right ankle ORIF.  She remains compliant with nonweightbearing status.  Overall her wounds have been healing significantly and drying up.  She really endorses essentially no drainage at this time.  There is no redness or erythema.  I  PMH/PSH/Family History/Social History/Meds/Allergies:    Past Medical History:  Diagnosis Date   Anemia    Migraine    Ovarian cyst    Past Surgical History:  Procedure Laterality Date   OOPHORECTOMY Right    ORIF ANKLE FRACTURE Right 12/31/2020   Procedure: OPEN REDUCTION INTERNAL FIXATION (ORIF) RIGHT ANKLE FRACTURE;  Surgeon: Huel Cote, MD;  Location: [facility] OR;  Service: Orthopedics;  Laterality: Right;   Social History   Socioeconomic History   Marital status: Single    Spouse name: Not on file   Number of children: 1   Years of education: Not on file   Highest education level: Some college, no degree  Occupational History   Occupation: Company secretary  Tobacco Use   Smoking status: Former    Packs/day: 0.50    Types: Cigarettes, Cigars   Smokeless tobacco: Former   Tobacco comments:    quit with preg  Vaping Use   Vaping Use: Never used  Substance and Sexual Activity   Alcohol use: No   Drug use: No   Sexual activity: Yes    Birth control/protection: None  Other Topics Concern   Not on file  Social History Narrative   Patient is right-handed.She lives in a one story house. Seh drinks 2 cups of coffee a day, and a glass of tea or soda  QOD. She walks extensively at work.   Social Determinants of Health   Financial Resource Strain: Not on file  Food Insecurity: Not on file  Transportation Needs: Not on file  Physical Activity: Not on file  Stress: Not on file  Social Connections: Not on file   Family History  Problem Relation Age of Onset   Hypertension Maternal  Grandmother    Cancer Maternal Grandfather        pancreatic   No Known Allergies Current Outpatient Medications  Medication Sig Dispense Refill   aspirin EC 325 MG tablet Take 1 tablet (325 mg total) by mouth daily for 30 days. 100 tablet 0   cetirizine (ZYRTEC) 10 MG tablet Take 10 mg by mouth every other day.     methocarbamol (ROBAXIN) 500 MG tablet Take 1 tablet (500 mg total) by mouth 4 (four) times daily. 30 tablet 2   oxyCODONE (OXY IR/ROXICODONE) 5 MG immediate release tablet Take 1 tablet (5 mg total) by mouth every 4 (four) hours as needed (severe pain). 20 tablet 0   oxycodone (OXY-IR) 5 MG capsule Take 1 capsule (5 mg total) by mouth every 4 (four) hours as needed (severe pain). 5 capsule 0   No current facility-administered medications for this visit.   No results found.  Review of Systems:   A ROS was performed including pertinent positives and negatives as documented in the HPI.   Musculoskeletal Exam:    Incisions are well-healing.  There a very small distal punctate granulation area about the distal fibula.  There is no exposed hardware.  There is no  active drainage.  There is no redness erythema.  Range of motion is 20 degrees of dorsiflexion 30 degrees of plantar flexion.  Overall swelling is dramatically improved  Imaging:   X-rays 3 views of the right ankle: Intact ankle mortise with evidence of healing status post bimalleolar ankle fracture  I personally reviewed and interpreted the radiographs.   Assessment:   30 year old female 5-week status post right ankle ORIF overall doing well.  Lateral wound is healing with secondary intention distally.  Overall her wounds appear much improved today.  I would like to see her back in 2 weeks.  We will hold off on weightbearing until that visit.  She may be touch toe weightbearing at this time but is not to put weight through the leg.  Plan :    -She will be doing physical therapy, although I will hold off on  weightbearing through the right ankle for 2 weeks.   I personally saw and evaluated the patient, and participated in the management and treatment plan.  Huel Cote, MD Attending Physician, Orthopedic Surgery  This document was dictated using Dragon voice recognition software. A reasonable attempt at proof reading has been made to minimize errors.

## 2021-02-09 ENCOUNTER — Ambulatory Visit (HOSPITAL_BASED_OUTPATIENT_CLINIC_OR_DEPARTMENT_OTHER): Payer: Medicaid Other | Admitting: Physical Therapy

## 2021-02-13 ENCOUNTER — Encounter (HOSPITAL_BASED_OUTPATIENT_CLINIC_OR_DEPARTMENT_OTHER): Payer: Self-pay | Admitting: Physical Therapy

## 2021-02-13 ENCOUNTER — Other Ambulatory Visit: Payer: Self-pay

## 2021-02-13 ENCOUNTER — Ambulatory Visit (HOSPITAL_BASED_OUTPATIENT_CLINIC_OR_DEPARTMENT_OTHER): Payer: Medicaid Other | Admitting: Physical Therapy

## 2021-02-13 DIAGNOSIS — M25571 Pain in right ankle and joints of right foot: Secondary | ICD-10-CM

## 2021-02-13 DIAGNOSIS — M6281 Muscle weakness (generalized): Secondary | ICD-10-CM

## 2021-02-13 DIAGNOSIS — M25671 Stiffness of right ankle, not elsewhere classified: Secondary | ICD-10-CM

## 2021-02-13 DIAGNOSIS — R262 Difficulty in walking, not elsewhere classified: Secondary | ICD-10-CM

## 2021-02-13 NOTE — Therapy (Addendum)
[facility] [address] [place], [place], [address] Phone: [phone]   Fax:  [phone]  Physical Therapy Treatment  Patient Details  Name: Cynthia Horne MRN: [id number] Date of Birth: [birth year] Referring Provider (PT): Huel Cote, MD   Encounter Date: 02/13/2021   PT End of Session - 02/13/21 1358     Visit Number 3   Number of Visits 28    Date for PT Re-Evaluation 04/01/21    Authorization Type [employer]    PT Start Time 1319    PT Stop Time 1400    PT Time Calculation (min) 41 min    Equipment Utilized During Treatment Other (comment)   knee scooter   Activity Tolerance Patient tolerated treatment well    Behavior During Therapy [facility] for tasks assessed/performed             Past Medical History:  Diagnosis Date   Anemia    Migraine    Ovarian cyst     Past Surgical History:  Procedure Laterality Date   OOPHORECTOMY Right    ORIF ANKLE FRACTURE Right 12/31/2020   Procedure: OPEN REDUCTION INTERNAL FIXATION (ORIF) RIGHT ANKLE FRACTURE;  Surgeon: Huel Cote, MD;  Location: [facility] OR;  Service: Orthopedics;  Laterality: Right;    There were no vitals filed for this visit.   Subjective Assessment - 02/13/21 1325     Subjective pt is 6 weeks and 2 days post op.  Pt states she is not having constant pain.  Pt states she mainly only has pain if she has been in the boot for too long or if it's cold outside.  Pt has been using the knee scooter and is compliant with NWB'ing precautions.  Pt states her ankle ached after prior Rx.  Pt denies pain currently.  Pt saw MD on 02/06/21 and had x rays.  pt states MD informed her she could put her foot down on floor though not to put wt thru foot.   MD note indicated her wounds are improving and to return to MD in 2 weeks.  Pt to hold off on weightbearing until that visit and may be touch toe weightbearing at this time but is not to put weight through the leg.     Pertinent History MD note indicated her wounds are improving and to return to MD in 2 weeks.  Pt to hold off on weightbearing until that visit and may be touch toe weightbearing at this time but is not to put weight through the leg.    Currently in Pain? No/denies                [facility] PT Assessment - 02/13/21 0001       Observation/Other Assessments   Observations Pt in an ace wrap with 1 piece of gauze on lateral incision over wound.  pt has much improved healing with a small area of a wound.  Pt has no drainage.      ROM / Strength   AROM / PROM / Strength AROM;PROM      AROM   AROM Assessment Site Ankle    Right/Left Ankle Right    Right Ankle Dorsiflexion -5    Right Ankle Plantar Flexion 35    Right Ankle Inversion 13    Right Ankle Eversion 2      PROM   PROM Assessment Site Ankle    Right/Left Ankle Right    Right Ankle Dorsiflexion 3    Right  Ankle Plantar Flexion 42    Right Ankle Inversion 11    Right Ankle Eversion 3                           [facility] Adult PT Treatment/Exercise - 02/13/21 0001       Exercises   Exercises Ankle;Knee/Hip   Reviewed pain level, pt presentation, and Pt reported info from MD.  See pt education.  Reviewed and updated HEP.  Pt received a HEP handout and was educated in correct form and appropriate frequency.  Assessed ROM.  See flow sheet for exercises.     Ankle Exercises: Seated   ABC's Limitations attempted though stopped due to pain    Other Seated Ankle Exercises Pt received ankle PROM in DF, PF, Eve, and Inv per pt tolerance and jt stiffness    Other Seated Ankle Exercises Ankle pumps 3x10 reps, ankle circles 2x10 cw and ccw, and toe flexion and extension 2x10 reps                     PT Education - 02/13/21 1405     Education Details POC, ROM findings, MD orders/WB'ing status, and exercise form.  Pt received a HEP handout and was educated in correct form and appropriate frequency.  Pt instructed  to not perform exercises into a painful range and she should not have increased pain with HEP.  Access Code: [email or web address]  URL: [email or web address]  Date: 02/13/2021  Prepared by: Aaron Edelman    Exercises  ANKLE PUMPS - 3 x daily - 7 x weekly - 2-3 sets - 10 reps  Seated Ankle Circles - 2 x daily - 7 x weekly - 2 sets - 10 reps  Seated Toe Curl - 3 x daily - 7 x weekly - 2-3 sets - 10 reps    Person(s) Educated Patient    Methods Explanation;Demonstration;Verbal cues;Handout    Comprehension Verbalized understanding;Returned demonstration;Verbal cues required              PT Short Term Goals - 01/07/21 0835       PT SHORT TERM GOAL #1   Title Pt will be independent and compliant with HEP for improved pain, strength, ROM, and function.    Time 4    Period Weeks    Status New    Target Date 02/03/21      PT SHORT TERM GOAL #2   Title Pt will perform daily mobility safely while maintaining NWB'ing restrictions    Time 6    Period Weeks    Status New    Target Date 02/17/21      PT SHORT TERM GOAL #3   Title Pt will demo R anke AROM/PROM to be at least 5/10 deg in DF, 35/40 deg in PF, 15/20 deg in inversion, and 7/10 deg in eversion for improved ankle stiffness and mobility.    Time 6    Period Weeks    Status New    Target Date 02/17/21      PT SHORT TERM GOAL #4   Title Pt will demo at least 12 deg of DF AROM for improved stiffness and gait.    Time 9    Period Weeks    Status New    Target Date 03/10/21      PT SHORT TERM GOAL #5   Title Pt will progress with WB'ing per MD orders without adverse effects    Time 8  Period Weeks    Status New    Target Date 03/03/21      Additional Short Term Goals   Additional Short Term Goals Yes      PT SHORT TERM GOAL #6   Title Pt will ambulate short to intermediate distance without AD without significant pain or difficulty.    Time 11    Period Weeks    Status New    Target Date 03/24/21      PT SHORT TERM  GOAL #7   Title Pt will be able to perform her normal daily transfers without difficulty.    Time 11    Period Weeks    Status New    Target Date 03/24/21               PT Long Term Goals - 01/07/21 1423       PT LONG TERM GOAL #1   Title Pt will ambulate with a normalized heel to toe gait pattern without limping.    Time 16    Period Weeks    Status New    Target Date 04/28/21      PT LONG TERM GOAL #2   Title Pt will ambulate extended community distance without an AD without increased pain or difficulty.    Time 16    Period Weeks    Status New    Target Date 04/28/21      PT LONG TERM GOAL #3   Title Pt will be able to perform stairs with a reciprocal gait without using the rail.    Time 16    Period Weeks    Status New    Target Date 04/28/21      PT LONG TERM GOAL #4   Title Pt will demo 5/5 strength in R ankle DF, Inv, and Eve and WFL for PF tested in sitting for improved tolerance to and performance of functional mobility skills.    Time 16    Period Weeks    Status New    Target Date 04/28/21      PT LONG TERM GOAL #5   Title Pt will be able to ambulate up her hill to enter her home without significant pain or difficulty.    Time 16    Period Weeks    Status New    Target Date 04/28/21      Additional Long Term Goals   Additional Long Term Goals Yes      PT LONG TERM GOAL #6   Title Pt will be able to perform her normal standing activities and IADLs including household chores without significant pain or limitation.    Time 16    Period Weeks    Status New    Target Date 04/28/21                   Plan - 02/13/21 1425     Clinical Impression Statement Pt has had some scheduling issues due to transportation and presents to Rx today being 6 weeks and 2 days post op.  Pt is still not to be WB'ing per prior MD note.  She will return to MD next week and may be able to put weight thru R LE after that appt depending on MD orders.  Pt's wound  is healing and has made good progress from prior appt.  PT assessed ROM today and pt is limited in all directions.  Gave pt a HEP handout to improve ROM and stiffness.  Pt demonstrates good understanding of HEP.  Pt performed exercises well.  She had some pain with AROM though no significantly increased pain after completting sets.  Pt had pain with ankle ABCs and Pt had pt stop that exercise and did not include in HEP.  Pt responded well to Rx having no c/o's after Rx.  She states she had some discomfort after Rx but no increased pain.    PT Treatment/Interventions ADLs/Self Care Home Management;Cryotherapy;Electrical Stimulation;DME Instruction;Gait training;Stair training;Functional mobility training;Therapeutic activities;Therapeutic exercise;Balance training;Patient/family education;Neuromuscular re-education;Manual techniques;Scar mobilization;Passive range of motion;Vasopneumatic Device    PT Next Visit Plan Pt to return to MD in 2 weeks from 02/06/21 and Pt to hold off on weightbearing until Pt sees MD.  Cont per protocol, cont with ROM.  Cont to monitor wound.    PT Home Exercise Plan Access Code: [email or web address]  Updated HEP today.    Consulted and Agree with Plan of Care Patient             Patient will benefit from skilled therapeutic intervention in order to improve the following deficits and impairments:  Abnormal gait, Decreased activity tolerance, Decreased balance, Decreased mobility, Decreased endurance, Decreased range of motion, Decreased strength, Increased edema, Hypomobility, Difficulty walking, Impaired flexibility, Pain  Visit Diagnosis: Pain in right ankle and joints of right foot  Muscle weakness (generalized)  Difficulty in walking, not elsewhere classified  Stiffness of right ankle, not elsewhere classified     Problem List Patient Active Problem List   Diagnosis Date Noted   Migraine headache with aura 11/05/2015    Audie Clear III PT, DPT 02/13/21 2:37  PM PHYSICAL THERAPY DISCHARGE SUMMARY  Visits from Start of Care: 3  Current functional level related to goals / functional outcomes: Unable to assess current functional status or goals due to pt not present at discharge.     Remaining deficits: Unable to assess due to pt not present at discharge.   Education / Equipment: See above   Pt was seen in PT for 3 visits form 01/06/21 - 02/13/21 .   She will be discharged from skilled PT services due to having multiple cancellations and No-shows.   Audie Clear III PT, DPT 06/02/21 7:52 AM    [facility] [address] [place], [place], [address] Phone: [phone]   Fax:  [phone]  Name: Cynthia Horne MRN: [id number] Date of Birth: 11/18/1990

## 2021-02-15 ENCOUNTER — Encounter (HOSPITAL_BASED_OUTPATIENT_CLINIC_OR_DEPARTMENT_OTHER): Payer: Self-pay | Admitting: Orthopaedic Surgery

## 2021-02-15 ENCOUNTER — Encounter (HOSPITAL_BASED_OUTPATIENT_CLINIC_OR_DEPARTMENT_OTHER): Payer: Self-pay | Admitting: Physical Therapy

## 2021-02-16 ENCOUNTER — Encounter (HOSPITAL_BASED_OUTPATIENT_CLINIC_OR_DEPARTMENT_OTHER): Payer: Self-pay | Admitting: Orthopaedic Surgery

## 2021-02-16 NOTE — Telephone Encounter (Signed)
Faxed forms to [phone]

## 2021-02-24 ENCOUNTER — Encounter (HOSPITAL_BASED_OUTPATIENT_CLINIC_OR_DEPARTMENT_OTHER): Payer: Self-pay

## 2021-02-24 ENCOUNTER — Telehealth: Payer: Self-pay | Admitting: Orthopaedic Surgery

## 2021-02-24 NOTE — Telephone Encounter (Signed)
Pt states she needs the same form you already filled out for her doctors appts, for her physical therapy appts. She needs it done asap.   CB [phone]

## 2021-02-24 NOTE — Telephone Encounter (Signed)
Faxed forms to patient's work. 02/24/2021

## 2021-02-26 ENCOUNTER — Encounter (HOSPITAL_BASED_OUTPATIENT_CLINIC_OR_DEPARTMENT_OTHER): Payer: Self-pay | Admitting: Orthopaedic Surgery

## 2021-02-26 ENCOUNTER — Other Ambulatory Visit (HOSPITAL_BASED_OUTPATIENT_CLINIC_OR_DEPARTMENT_OTHER): Payer: Self-pay | Admitting: Orthopaedic Surgery

## 2021-02-26 MED ORDER — CEPHALEXIN 500 MG PO CAPS: 500.0000 mg | ORAL_CAPSULE | Freq: Four times a day (QID) | ORAL | 0 refills | Status: AC

## 2021-03-02 ENCOUNTER — Ambulatory Visit (HOSPITAL_BASED_OUTPATIENT_CLINIC_OR_DEPARTMENT_OTHER)
Admission: RE | Admit: 2021-03-02 | Discharge: 2021-03-02 | Disposition: A | Discharge status: Home / Self Care | Payer: Medicaid Other | Source: Ambulatory Visit | Attending: Orthopaedic Surgery | Admitting: Orthopaedic Surgery

## 2021-03-02 ENCOUNTER — Ambulatory Visit (HOSPITAL_BASED_OUTPATIENT_CLINIC_OR_DEPARTMENT_OTHER): Payer: Medicaid Other | Admitting: Physical Therapy

## 2021-03-02 ENCOUNTER — Other Ambulatory Visit (HOSPITAL_BASED_OUTPATIENT_CLINIC_OR_DEPARTMENT_OTHER): Payer: Self-pay | Admitting: Orthopaedic Surgery

## 2021-03-02 ENCOUNTER — Other Ambulatory Visit: Payer: Self-pay

## 2021-03-02 ENCOUNTER — Ambulatory Visit (INDEPENDENT_AMBULATORY_CARE_PROVIDER_SITE_OTHER): Payer: Medicaid Other | Admitting: Orthopaedic Surgery

## 2021-03-02 ENCOUNTER — Other Ambulatory Visit (HOSPITAL_BASED_OUTPATIENT_CLINIC_OR_DEPARTMENT_OTHER): Payer: Self-pay

## 2021-03-02 DIAGNOSIS — S82841A Displaced bimalleolar fracture of right lower leg, initial encounter for closed fracture: Secondary | ICD-10-CM

## 2021-03-02 DIAGNOSIS — F419 Anxiety disorder, unspecified: Secondary | ICD-10-CM

## 2021-03-02 MED ORDER — SULFAMETHOXAZOLE-TRIMETHOPRIM 800-160 MG PO TABS
1.0000 | ORAL_TABLET | Freq: Two times a day (BID) | ORAL | 0 refills | Status: AC
  Filled 2021-03-02: qty 14, 7d supply, fill #0

## 2021-03-02 MED ORDER — SILVER SULFADIAZINE 1 % EX CREA
1.0000 "application " | TOPICAL_CREAM | Freq: Every day | CUTANEOUS | 0 refills | Status: DC
  Filled 2021-03-02: qty 50, 7d supply, fill #0

## 2021-03-02 NOTE — Progress Notes (Signed)
Post Operative Evaluation    Procedure/Date of Surgery: 12/31/20  Interval History:  Presents today for follow-up status post right ankle ORIF.  She presents today with unfortunately an area about the lateral aspect that is now draining again.  She has been keeping this covered with a dressing.  Denies any fevers or chills.  PMH/PSH/Family History/Social History/Meds/Allergies:    Past Medical History:  Diagnosis Date   Anemia    Migraine    Ovarian cyst    Past Surgical History:  Procedure Laterality Date   OOPHORECTOMY Right    ORIF ANKLE FRACTURE Right 12/31/2020   Procedure: OPEN REDUCTION INTERNAL FIXATION (ORIF) RIGHT ANKLE FRACTURE;  Surgeon: Huel Cote, MD;  Location: [facility] OR;  Service: Orthopedics;  Laterality: Right;   Social History   Socioeconomic History   Marital status: Single    Spouse name: Not on file   Number of children: 1   Years of education: Not on file   Highest education level: Some college, no degree  Occupational History   Occupation: Company secretary  Tobacco Use   Smoking status: Former    Packs/day: 0.50    Types: Cigarettes, Cigars   Smokeless tobacco: Former   Tobacco comments:    quit with preg  Vaping Use   Vaping Use: Never used  Substance and Sexual Activity   Alcohol use: No   Drug use: No   Sexual activity: Yes    Birth control/protection: None  Other Topics Concern   Not on file  Social History Narrative   Patient is right-handed.She lives in a one story house. Seh drinks 2 cups of coffee a day, and a glass of tea or soda  QOD. She walks extensively at work.   Social Determinants of Health   Financial Resource Strain: Not on file  Food Insecurity: Not on file  Transportation Needs: Not on file  Physical Activity: Not on file  Stress: Not on file  Social Connections: Not on file   Family History  Problem Relation Age of Onset   Hypertension Maternal Grandmother    Cancer Maternal  Grandfather        pancreatic   No Known Allergies Current Outpatient Medications  Medication Sig Dispense Refill   cephALEXin (KEFLEX) 500 MG capsule Take 1 capsule (500 mg total) by mouth 4 (four) times daily for 5 days. 20 capsule 0   aspirin EC 325 MG tablet Take 1 tablet (325 mg total) by mouth daily for 30 days. 100 tablet 0   cetirizine (ZYRTEC) 10 MG tablet Take 10 mg by mouth every other day.     methocarbamol (ROBAXIN) 500 MG tablet Take 1 tablet (500 mg total) by mouth 4 (four) times daily. 30 tablet 2   oxyCODONE (OXY IR/ROXICODONE) 5 MG immediate release tablet Take 1 tablet (5 mg total) by mouth every 4 (four) hours as needed (severe pain). 20 tablet 0   oxycodone (OXY-IR) 5 MG capsule Take 1 capsule (5 mg total) by mouth every 4 (four) hours as needed (severe pain). 5 capsule 0   No current facility-administered medications for this visit.   No results found.  Review of Systems:   A ROS was performed including pertinent positives and negatives as documented in the HPI.   Musculoskeletal Exam:    Incisions are well-healing.  There  a very small mid area of the wound that is not draining.  This is a serosanguineous drainage.  No frank purulence.   Imaging:   X-rays 3 views of the right ankle: Intact ankle mortise with evidence of healing status post bimalleolar ankle fracture  I personally reviewed and interpreted the radiographs.   Assessment:   30 year old female status post open reduction internal fixation of the right ankle.  Now presents with a repeated draining wound about the mid aspect of the lateral incision.  I described that it is possible that this is a suture abscess.  This appears to have decompressed.  I would like to switch her from Keflex to Bactrim for the right ankle.  I would prefer to prescribe an additional 1 week of Bactrim.  I will also have her mom perform Silvadene dressing changes.  I described that ultimately given the recurrent drainage from  the wounds that I would recommend ultimate irrigation debridement with removal of hardware.  There is still some lucency seen about the lateral fibula and I would like to have her begin weightbearing for an additional 2 weeks to stimulate some additional bone healing.  I will see her back in 2 weeks for repeat wound check Plan :    -She may perform weightbearing as tolerated -Return to clinic 2 weeks for wound check   I personally saw and evaluated the patient, and participated in the management and treatment plan.  Huel Cote, MD Attending Physician, Orthopedic Surgery  This document was dictated using Dragon voice recognition software. A reasonable attempt at proof reading has been made to minimize errors.

## 2021-03-03 ENCOUNTER — Encounter (HOSPITAL_BASED_OUTPATIENT_CLINIC_OR_DEPARTMENT_OTHER): Payer: Self-pay | Admitting: Orthopaedic Surgery

## 2021-03-03 ENCOUNTER — Telehealth: Payer: Self-pay

## 2021-03-03 NOTE — Telephone Encounter (Signed)
Dr. Steward Drone spoke to patient and advised to cease antibiotic usage and to begin weight bearing and wound dressing changes

## 2021-03-03 NOTE — Telephone Encounter (Signed)
Patient called and states that antibiotics that she has been prescribed is continuing to make her sick. She is feeling very fatigue  and would like to know what she is able to do for this.  [phone] (home)

## 2021-03-04 ENCOUNTER — Ambulatory Visit (HOSPITAL_BASED_OUTPATIENT_CLINIC_OR_DEPARTMENT_OTHER): Payer: Medicaid Other | Attending: Orthopaedic Surgery | Admitting: Physical Therapy

## 2021-03-04 DIAGNOSIS — M25671 Stiffness of right ankle, not elsewhere classified: Secondary | ICD-10-CM | POA: Insufficient documentation

## 2021-03-04 DIAGNOSIS — M6281 Muscle weakness (generalized): Secondary | ICD-10-CM | POA: Insufficient documentation

## 2021-03-04 DIAGNOSIS — M25571 Pain in right ankle and joints of right foot: Secondary | ICD-10-CM | POA: Insufficient documentation

## 2021-03-04 DIAGNOSIS — R262 Difficulty in walking, not elsewhere classified: Secondary | ICD-10-CM | POA: Insufficient documentation

## 2021-03-09 ENCOUNTER — Encounter (HOSPITAL_BASED_OUTPATIENT_CLINIC_OR_DEPARTMENT_OTHER): Payer: Medicaid Other | Admitting: Orthopaedic Surgery

## 2021-03-09 ENCOUNTER — Ambulatory Visit (HOSPITAL_BASED_OUTPATIENT_CLINIC_OR_DEPARTMENT_OTHER): Payer: Medicaid Other | Admitting: Physical Therapy

## 2021-03-10 ENCOUNTER — Telehealth (HOSPITAL_BASED_OUTPATIENT_CLINIC_OR_DEPARTMENT_OTHER): Payer: Self-pay | Admitting: Orthopaedic Surgery

## 2021-03-10 NOTE — Telephone Encounter (Signed)
LMOM

## 2021-03-11 ENCOUNTER — Ambulatory Visit (HOSPITAL_BASED_OUTPATIENT_CLINIC_OR_DEPARTMENT_OTHER): Payer: Medicaid Other | Admitting: Physical Therapy

## 2021-03-16 ENCOUNTER — Encounter (HOSPITAL_BASED_OUTPATIENT_CLINIC_OR_DEPARTMENT_OTHER): Payer: Medicaid Other | Admitting: Orthopaedic Surgery

## 2021-03-16 ENCOUNTER — Encounter (HOSPITAL_BASED_OUTPATIENT_CLINIC_OR_DEPARTMENT_OTHER): Payer: Self-pay | Admitting: Orthopaedic Surgery

## 2021-03-16 ENCOUNTER — Ambulatory Visit (HOSPITAL_BASED_OUTPATIENT_CLINIC_OR_DEPARTMENT_OTHER): Payer: Medicaid Other | Admitting: Physical Therapy

## 2021-03-16 NOTE — Progress Notes (Signed)
Called patient with no answer. Continue to reach out to patient with attempt to schedule an appt so that we can discuss the role of plate removal with CT scan to assess for healing prior to removal. Also attempted to reach out to the patients mother with no response. Patient has continued to no show at physical therapy. Will continue to attempt to reach out to her with a plan to send a certified letter in the coming day if no response.

## 2021-03-18 ENCOUNTER — Encounter (HOSPITAL_BASED_OUTPATIENT_CLINIC_OR_DEPARTMENT_OTHER): Payer: Self-pay | Admitting: Orthopaedic Surgery

## 2021-03-18 ENCOUNTER — Ambulatory Visit (HOSPITAL_BASED_OUTPATIENT_CLINIC_OR_DEPARTMENT_OTHER): Payer: Medicaid Other | Admitting: Physical Therapy

## 2021-03-18 NOTE — Progress Notes (Signed)
Attempted to contact patient in order to schedule a follow up visit. Also attempted to contact her mother [employer]'s number with now answer. Will plan to send a certified letter at this time.

## 2021-03-23 ENCOUNTER — Encounter (HOSPITAL_BASED_OUTPATIENT_CLINIC_OR_DEPARTMENT_OTHER): Payer: Self-pay | Admitting: Physical Therapy

## 2021-03-25 ENCOUNTER — Encounter (HOSPITAL_BASED_OUTPATIENT_CLINIC_OR_DEPARTMENT_OTHER): Payer: Self-pay | Admitting: Physical Therapy

## 2021-03-28 ENCOUNTER — Emergency Department (HOSPITAL_BASED_OUTPATIENT_CLINIC_OR_DEPARTMENT_OTHER)
Admission: EM | Admit: 2021-03-28 | Discharge: 2021-03-28 | Disposition: A | Discharge status: Home / Self Care | Payer: Medicaid Other | Attending: Emergency Medicine | Admitting: Emergency Medicine

## 2021-03-28 ENCOUNTER — Other Ambulatory Visit: Payer: Self-pay

## 2021-03-28 ENCOUNTER — Encounter (HOSPITAL_BASED_OUTPATIENT_CLINIC_OR_DEPARTMENT_OTHER): Payer: Self-pay

## 2021-03-28 DIAGNOSIS — G43109 Migraine with aura, not intractable, without status migrainosus: Secondary | ICD-10-CM | POA: Diagnosis not present

## 2021-03-28 DIAGNOSIS — Z87891 Personal history of nicotine dependence: Secondary | ICD-10-CM | POA: Insufficient documentation

## 2021-03-28 DIAGNOSIS — Z7982 Long term (current) use of aspirin: Secondary | ICD-10-CM | POA: Insufficient documentation

## 2021-03-28 DIAGNOSIS — R519 Headache, unspecified: Secondary | ICD-10-CM | POA: Diagnosis present

## 2021-03-28 LAB — CBC WITH DIFFERENTIAL/PLATELET
Abs Immature Granulocytes: 0.02 10*3/uL (ref 0.00–0.07)
Basophils Absolute: 0 10*3/uL (ref 0.0–0.1)
Basophils Relative: 0 %
Eosinophils Absolute: 0 10*3/uL (ref 0.0–0.5)
Eosinophils Relative: 0 %
HCT: 32.4 % — ABNORMAL LOW (ref 36.0–46.0)
Hemoglobin: 9.7 g/dL — ABNORMAL LOW (ref 12.0–15.0)
Immature Granulocytes: 0 %
Lymphocytes Relative: 14 %
Lymphs Abs: 1.3 10*3/uL (ref 0.7–4.0)
MCH: 21.4 pg — ABNORMAL LOW (ref 26.0–34.0)
MCHC: 29.9 g/dL — ABNORMAL LOW (ref 30.0–36.0)
MCV: 71.5 fL — ABNORMAL LOW (ref 80.0–100.0)
Monocytes Absolute: 0.5 10*3/uL (ref 0.1–1.0)
Monocytes Relative: 6 %
Neutro Abs: 7.7 10*3/uL (ref 1.7–7.7)
Neutrophils Relative %: 80 %
Platelets: 271 10*3/uL (ref 150–400)
RBC: 4.53 MIL/uL (ref 3.87–5.11)
RDW: 20.1 % — ABNORMAL HIGH (ref 11.5–15.5)
WBC: 9.6 10*3/uL (ref 4.0–10.5)
nRBC: 0 % (ref 0.0–0.2)

## 2021-03-28 LAB — BASIC METABOLIC PANEL
Anion gap: 9 (ref 5–15)
BUN: <5 mg/dL — ABNORMAL LOW (ref 6–20)
CO2: 27 mmol/L (ref 22–32)
Calcium: 9.8 mg/dL (ref 8.9–10.3)
Chloride: 103 mmol/L (ref 98–111)
Creatinine, Ser: 0.68 mg/dL (ref 0.44–1.00)
GFR, Estimated: >60 mL/min (ref >60)
Glucose, Bld: 127 mg/dL — ABNORMAL HIGH (ref 70–99)
Potassium: 4.1 mmol/L (ref 3.5–5.1)
Sodium: 139 mmol/L (ref 135–145)

## 2021-03-28 MED ORDER — HYDROMORPHONE HCL 1 MG/ML IJ SOLN
1.0000 mg | Freq: Once | INTRAMUSCULAR | Status: AC
  Administered 2021-03-28: 1 mg via INTRAVENOUS
  Filled 2021-03-28: qty 1

## 2021-03-28 MED ORDER — SUMATRIPTAN SUCCINATE 100 MG PO TABS: 100.0000 mg | ORAL_TABLET | ORAL | 0 refills | Status: DC | PRN

## 2021-03-28 MED ORDER — DIPHENHYDRAMINE HCL 50 MG/ML IJ SOLN
12.5000 mg | Freq: Once | INTRAMUSCULAR | Status: AC
Start: 2021-03-28 — End: 2021-03-28
  Administered 2021-03-28: 12.5 mg via INTRAVENOUS
  Filled 2021-03-28: qty 1

## 2021-03-28 MED ORDER — SODIUM CHLORIDE 0.9 % IV BOLUS
1000.0000 mL | Freq: Once | INTRAVENOUS | Status: AC
  Administered 2021-03-28: 1000 mL via INTRAVENOUS

## 2021-03-28 MED ORDER — ONDANSETRON HCL 4 MG/2ML IJ SOLN
4.0000 mg | Freq: Once | INTRAMUSCULAR | Status: AC
Start: 2021-03-28 — End: 2021-03-28
  Administered 2021-03-28: 4 mg via INTRAVENOUS
  Filled 2021-03-28: qty 2

## 2021-03-28 MED ORDER — MORPHINE SULFATE (PF) 4 MG/ML IV SOLN
4.0000 mg | Freq: Once | INTRAVENOUS | Status: AC
  Administered 2021-03-28: 4 mg via INTRAVENOUS
  Filled 2021-03-28: qty 1

## 2021-03-28 NOTE — ED Triage Notes (Signed)
Patient here POV from Home with Family for Headache.  Patient has a History of Migraines but states this one is severe. Headache has been present for approximately 36 hours. OTC Medications have not been effective.  NAD Noted during Triage. A&Ox4. GCS 15. BIB Wheelchair. Moderate Nausea. No Vomiting. Photosensitive.

## 2021-03-28 NOTE — ED Provider Notes (Signed)
[facility] EMERGENCY DEPT Provider Note   CSN: [id number] Arrival date & time: 03/28/21  1854     History Chief Complaint  Patient presents with   Headache    Cynthia Horne is a 30 y.o. female.  With past medical history of migraines who presents emergency department with headache.  She states that she has had a headache since 3 AM.  She states that it was gradual onset and now 13/10.  Describes it as pressure behind her eyes, global headache that is now radiating down her neck.  Endorses photophobia.  She does endorse nausea without vomiting.  She denies any recent trauma to the head.  She denies any weakness, numbness or tingling to the bilateral upper or lower extremities.  Denies fevers.  She states that she has daily headaches due to her migraines and normally takes Tylenol for them.  She states that she is not followed by neurology or headache specialist.  She states that when she was seen for headaches they told her to take Excedrin.   Headache Associated symptoms: eye pain, myalgias, nausea, neck pain and photophobia   Associated symptoms: no dizziness, no fever, no neck stiffness and no vomiting       Past Medical History:  Diagnosis Date   Anemia    Migraine    Ovarian cyst     Patient Active Problem List   Diagnosis Date Noted   Migraine headache with aura 11/05/2015    Past Surgical History:  Procedure Laterality Date   OOPHORECTOMY Right    ORIF ANKLE FRACTURE Right 12/31/2020   Procedure: OPEN REDUCTION INTERNAL FIXATION (ORIF) RIGHT ANKLE FRACTURE;  Surgeon: Huel Cote, MD;  Location: [facility] OR;  Service: Orthopedics;  Laterality: Right;     OB History     Gravida  2   Para  1   Term  1   Preterm      AB      Living  1      SAB      IAB      Ectopic      Multiple  0   Live Births  1           Family History  Problem Relation Age of Onset   Hypertension Maternal Grandmother    Cancer Maternal Grandfather         pancreatic    Social History   Tobacco Use   Smoking status: Former    Packs/day: 0.50    Types: Cigarettes, Cigars   Smokeless tobacco: Former   Tobacco comments:    quit with preg  Vaping Use   Vaping Use: Never used  Substance Use Topics   Alcohol use: No   Drug use: No    Home Medications Prior to Admission medications   Medication Sig Start Date End Date Taking? Authorizing Provider  aspirin EC 325 MG tablet Take 1 tablet (325 mg total) by mouth daily for 30 days. 12/30/20   Huel Cote, MD  cetirizine (ZYRTEC) 10 MG tablet Take 10 mg by mouth every other day.    [provider]  methocarbamol (ROBAXIN) 500 MG tablet Take 1 tablet (500 mg total) by mouth 4 (four) times daily. 01/13/21   Huel Cote, MD  oxyCODONE (OXY IR/ROXICODONE) 5 MG immediate release tablet Take 1 tablet (5 mg total) by mouth every 4 (four) hours as needed (severe pain). 01/02/21   Huel Cote, MD  oxycodone (OXY-IR) 5 MG capsule Take 1 capsule (5  mg total) by mouth every 4 (four) hours as needed (severe pain). 01/19/21   Huel Cote, MD  silver sulfADIAZINE (SILVADENE) 1 % cream Apply 1 application topically daily. 03/02/21   Huel Cote, MD    Allergies    Patient has no known allergies.  Review of Systems   Review of Systems  Constitutional:  Negative for fever.  Eyes:  Positive for photophobia and pain.  Respiratory:  Negative for shortness of breath.   Cardiovascular:  Negative for chest pain.  Gastrointestinal:  Positive for nausea. Negative for vomiting.  Musculoskeletal:  Positive for myalgias and neck pain. Negative for neck stiffness.  Neurological:  Positive for headaches. Negative for dizziness and light-headedness.  All other systems reviewed and are negative.  Physical Exam Updated Vital Signs BP 135/61 (BP Location: Left Arm)   Pulse 98   Temp 98.2 F (36.8 C) (Oral)   Resp 16   Ht 5\' 5"  (1.651 m)   Wt 131.5 kg   SpO2 100%   BMI 48.24 kg/m    Physical Exam Vitals and nursing note reviewed.  Constitutional:      General: She is not in acute distress.    Appearance: Normal appearance. She is well-developed. She is not toxic-appearing.  HENT:     Head: Normocephalic and atraumatic.     Mouth/Throat:     Mouth: Mucous membranes are moist.  Eyes:     General: No scleral icterus.    Extraocular Movements: Extraocular movements intact.     Right eye: Normal extraocular motion.     Left eye: Normal extraocular motion.     Pupils: Pupils are equal, round, and reactive to light.  Cardiovascular:     Rate and Rhythm: Regular rhythm. Tachycardia present.     Heart sounds: Normal heart sounds. No murmur heard. Pulmonary:     Effort: Pulmonary effort is normal. No respiratory distress.     Breath sounds: Normal breath sounds.  Abdominal:     General: Bowel sounds are normal.     Palpations: Abdomen is soft.  Musculoskeletal:        General: Normal range of motion.     Cervical back: Normal range of motion and neck supple.  Skin:    General: Skin is warm and dry.     Capillary Refill: Capillary refill takes less than 2 seconds.  Neurological:     General: No focal deficit present.     Mental Status: She is alert and oriented to person, place, and time. Mental status is at baseline.     GCS: GCS eye subscore is 4. GCS verbal subscore is 5. GCS motor subscore is 6.     Cranial Nerves: No facial asymmetry.     Sensory: No sensory deficit.     Motor: No weakness.  Psychiatric:        Mood and Affect: Mood normal.        Speech: Speech normal.        Behavior: Behavior normal.    ED Results / Procedures / Treatments   Labs (all labs ordered are listed, but only abnormal results are displayed) Labs Reviewed  BASIC METABOLIC PANEL - Abnormal; Notable for the following components:      Result Value   Glucose, Bld 127 (*)    BUN <5 (*)    All other components within normal limits  CBC WITH DIFFERENTIAL/PLATELET - Abnormal;  Notable for the following components:   Hemoglobin 9.7 (*)    HCT 32.4 (*)  MCV 71.5 (*)    MCH 21.4 (*)    MCHC 29.9 (*)    RDW 20.1 (*)    All other components within normal limits  PREGNANCY, URINE   EKG None  Radiology No results found.  Procedures Procedures   Medications Ordered in ED Medications  sodium chloride 0.9 % bolus 1,000 mL (0 mLs Intravenous Stopped 03/28/21 2140)  morphine 4 MG/ML injection 4 mg (4 mg Intravenous Given 03/28/21 2018)  ondansetron (ZOFRAN) injection 4 mg (4 mg Intravenous Given 03/28/21 2018)  diphenhydrAMINE (BENADRYL) injection 12.5 mg (12.5 mg Intravenous Given 03/28/21 2017)  HYDROmorphone (DILAUDID) injection 1 mg (1 mg Intravenous Given 03/28/21 2143)    ED Course  I have reviewed the triage vital signs and the nursing notes.  Pertinent labs & imaging results that were available during my care of the patient were reviewed by me and considered in my medical decision making (see chart for details).    MDM Rules/Calculators/A&P 30 year old female who presents to the emergency department with headache.  Headache is most consistent with benign migraine.  There are no headache red flags on exam.  Her neurological exam is without evidence of meningismus, altered mental status, focal neurological findings.  Presentation is consistent with acute intracranial bleed.  There is no history of trauma.  Doubt vertebral or carotid artery dissection given no neurological findings.  There is been no neck trauma or recent neck strain. Given headache cocktail. At time of handoff patient is pending discharge after pain control.  I given her initial headache cocktail with a bolus of fluid.  States that her headache went from 13/10 to 10/10 and nausea has abated.  I have redosed her with 1 mg IV Dilaudid.  Dr. Silverio Lay will reassess and dispo. Final Clinical Impression(s) / ED Diagnoses Final diagnoses:  Migraine with aura and without status migrainosus, not  intractable    Rx / DC Orders ED Discharge Orders     None        Cristopher Peru, PA-C 03/28/21 2203    Charlynne Pander, MD 03/28/21 2224

## 2021-03-28 NOTE — ED Notes (Signed)
Patient reported pain and unable to urinate at this time  Reported to RN

## 2021-03-28 NOTE — Discharge Instructions (Addendum)
Continue Excedrin or Tylenol for headache.  Take Imitrex for severe headaches.  You will need to follow-up with a neurologist  Return to ER if you have worse headaches, vomiting, trouble speaking, weakness or numbness

## 2021-03-28 NOTE — ED Notes (Signed)
Family at bedside. 

## 2021-03-29 ENCOUNTER — Emergency Department (HOSPITAL_BASED_OUTPATIENT_CLINIC_OR_DEPARTMENT_OTHER)
Admission: EM | Admit: 2021-03-29 | Discharge: 2021-03-29 | Disposition: A | Discharge status: Home / Self Care | Payer: Medicaid Other | Attending: Student | Admitting: Student

## 2021-03-29 ENCOUNTER — Encounter (HOSPITAL_BASED_OUTPATIENT_CLINIC_OR_DEPARTMENT_OTHER): Payer: Self-pay | Admitting: Emergency Medicine

## 2021-03-29 ENCOUNTER — Emergency Department (HOSPITAL_BASED_OUTPATIENT_CLINIC_OR_DEPARTMENT_OTHER): Payer: Medicaid Other

## 2021-03-29 DIAGNOSIS — R519 Headache, unspecified: Secondary | ICD-10-CM | POA: Insufficient documentation

## 2021-03-29 DIAGNOSIS — M542 Cervicalgia: Secondary | ICD-10-CM | POA: Diagnosis not present

## 2021-03-29 DIAGNOSIS — Z87891 Personal history of nicotine dependence: Secondary | ICD-10-CM | POA: Insufficient documentation

## 2021-03-29 DIAGNOSIS — Z20822 Contact with and (suspected) exposure to covid-19: Secondary | ICD-10-CM | POA: Insufficient documentation

## 2021-03-29 DIAGNOSIS — Z7982 Long term (current) use of aspirin: Secondary | ICD-10-CM | POA: Diagnosis not present

## 2021-03-29 DIAGNOSIS — M549 Dorsalgia, unspecified: Secondary | ICD-10-CM | POA: Insufficient documentation

## 2021-03-29 DIAGNOSIS — G44209 Tension-type headache, unspecified, not intractable: Secondary | ICD-10-CM

## 2021-03-29 LAB — COMPREHENSIVE METABOLIC PANEL
ALT: <5 U/L (ref 0–44)
AST: 7 U/L — ABNORMAL LOW (ref 15–41)
Albumin: 4.3 g/dL (ref 3.5–5.0)
Alkaline Phosphatase: 72 U/L (ref 38–126)
Anion gap: 8 (ref 5–15)
BUN: <5 mg/dL — ABNORMAL LOW (ref 6–20)
CO2: 27 mmol/L (ref 22–32)
Calcium: 9.8 mg/dL (ref 8.9–10.3)
Chloride: 103 mmol/L (ref 98–111)
Creatinine, Ser: 0.65 mg/dL (ref 0.44–1.00)
GFR, Estimated: >60 mL/min (ref >60)
Glucose, Bld: 104 mg/dL — ABNORMAL HIGH (ref 70–99)
Potassium: 4 mmol/L (ref 3.5–5.1)
Sodium: 138 mmol/L (ref 135–145)
Total Bilirubin: 0.4 mg/dL (ref 0.3–1.2)
Total Protein: 7.3 g/dL (ref 6.5–8.1)

## 2021-03-29 LAB — RESP PANEL BY RT-PCR (FLU A&B, COVID) ARPGX2
Influenza A by PCR: NEGATIVE
Influenza B by PCR: NEGATIVE
SARS Coronavirus 2 by RT PCR: NEGATIVE

## 2021-03-29 LAB — CBC WITH DIFFERENTIAL/PLATELET
Abs Immature Granulocytes: 0.01 10*3/uL (ref 0.00–0.07)
Basophils Absolute: 0 10*3/uL (ref 0.0–0.1)
Basophils Relative: 0 %
Eosinophils Absolute: 0 10*3/uL (ref 0.0–0.5)
Eosinophils Relative: 0 %
HCT: 33.6 % — ABNORMAL LOW (ref 36.0–46.0)
Hemoglobin: 10 g/dL — ABNORMAL LOW (ref 12.0–15.0)
Immature Granulocytes: 0 %
Lymphocytes Relative: 28 %
Lymphs Abs: 2.2 10*3/uL (ref 0.7–4.0)
MCH: 21.6 pg — ABNORMAL LOW (ref 26.0–34.0)
MCHC: 29.8 g/dL — ABNORMAL LOW (ref 30.0–36.0)
MCV: 72.6 fL — ABNORMAL LOW (ref 80.0–100.0)
Monocytes Absolute: 0.6 10*3/uL (ref 0.1–1.0)
Monocytes Relative: 8 %
Neutro Abs: 5 10*3/uL (ref 1.7–7.7)
Neutrophils Relative %: 64 %
Platelets: 254 10*3/uL (ref 150–400)
RBC: 4.63 MIL/uL (ref 3.87–5.11)
RDW: 20.2 % — ABNORMAL HIGH (ref 11.5–15.5)
WBC: 7.9 10*3/uL (ref 4.0–10.5)
nRBC: 0 % (ref 0.0–0.2)

## 2021-03-29 LAB — HCG, SERUM, QUALITATIVE: Preg, Serum: NEGATIVE

## 2021-03-29 MED ORDER — MORPHINE SULFATE (PF) 4 MG/ML IV SOLN
4.0000 mg | Freq: Once | INTRAVENOUS | Status: AC
  Administered 2021-03-29: 18:00:00 4 mg via INTRAVENOUS
  Filled 2021-03-29: qty 1

## 2021-03-29 MED ORDER — SODIUM CHLORIDE 0.9 % IV BOLUS
1000.0000 mL | Freq: Once | INTRAVENOUS | Status: AC
  Administered 2021-03-29: 16:00:00 1000 mL via INTRAVENOUS

## 2021-03-29 MED ORDER — DIPHENHYDRAMINE HCL 50 MG/ML IJ SOLN
25.0000 mg | Freq: Once | INTRAMUSCULAR | Status: AC
  Administered 2021-03-29: 16:00:00 25 mg via INTRAVENOUS
  Filled 2021-03-29: qty 1

## 2021-03-29 MED ORDER — METHOCARBAMOL 1000 MG/10ML IJ SOLN
1000.0000 mg | Freq: Once | INTRAMUSCULAR | Status: AC
  Administered 2021-03-29: 22:00:00 1000 mg via INTRAVENOUS

## 2021-03-29 MED ORDER — METHOCARBAMOL 1000 MG/10ML IJ SOLN
1000.0000 mg | Freq: Once | INTRAMUSCULAR | Status: AC
  Administered 2021-03-29: 16:00:00 1000 mg via INTRAVENOUS
  Filled 2021-03-29: qty 10

## 2021-03-29 MED ORDER — METHOCARBAMOL 500 MG PO TABS: 500.0000 mg | ORAL_TABLET | Freq: Three times a day (TID) | ORAL | 0 refills | Status: DC | PRN

## 2021-03-29 MED ORDER — METHOCARBAMOL 1000 MG/10ML IJ SOLN
1000.0000 mg | Freq: Once | INTRAMUSCULAR | Status: DC
  Filled 2021-03-29: qty 10

## 2021-03-29 MED ORDER — PROCHLORPERAZINE EDISYLATE 10 MG/2ML IJ SOLN
10.0000 mg | Freq: Once | INTRAMUSCULAR | Status: AC
  Administered 2021-03-29: 16:00:00 10 mg via INTRAVENOUS
  Filled 2021-03-29: qty 2

## 2021-03-29 MED ORDER — DEXAMETHASONE SODIUM PHOSPHATE 10 MG/ML IJ SOLN
10.0000 mg | Freq: Once | INTRAMUSCULAR | Status: AC
  Administered 2021-03-29: 19:00:00 10 mg via INTRAVENOUS
  Filled 2021-03-29: qty 1

## 2021-03-29 MED ORDER — KETOROLAC TROMETHAMINE 15 MG/ML IJ SOLN
15.0000 mg | Freq: Once | INTRAMUSCULAR | Status: AC
  Administered 2021-03-29: 22:00:00 15 mg via INTRAVENOUS
  Filled 2021-03-29: qty 1

## 2021-03-29 MED ORDER — MAGNESIUM SULFATE 2 GM/50ML IV SOLN
2.0000 g | Freq: Once | INTRAVENOUS | Status: AC
  Administered 2021-03-29: 19:00:00 2 g via INTRAVENOUS
  Filled 2021-03-29: qty 50

## 2021-03-29 MED ORDER — IOHEXOL 350 MG/ML SOLN
100.0000 mL | Freq: Once | INTRAVENOUS | Status: AC | PRN
  Administered 2021-03-29: 20:00:00 100 mL via INTRAVENOUS

## 2021-03-29 MED ORDER — MORPHINE SULFATE (PF) 4 MG/ML IV SOLN
4.0000 mg | Freq: Once | INTRAVENOUS | Status: AC
  Administered 2021-03-29: 22:00:00 4 mg via INTRAVENOUS
  Filled 2021-03-29: qty 1

## 2021-03-29 MED ORDER — MORPHINE SULFATE (PF) 4 MG/ML IV SOLN
4.0000 mg | Freq: Once | INTRAVENOUS | Status: AC
  Administered 2021-03-29: 16:00:00 4 mg via INTRAVENOUS
  Filled 2021-03-29: qty 1

## 2021-03-29 NOTE — ED Provider Notes (Signed)
[place] EMERGENCY DEPT Provider Note   CSN: [id number] Arrival date & time: 03/29/21  1225     History Chief Complaint  Patient presents with   Migraine    Cynthia Horne is a 30 y.o. female with a past medical history significant for chronic migraines with aura, who has been evaluated by neurology in the past, is not on any prophylactic medication who presents as a bounce back after being evaluated and treated for migraine last night.  Patient reports that her migraine only improved to a 7/10 in pain after multiple treatments last night, and then recurred shortly after her discharge to a 14/10 in severity.  Patient reports pain and tension located bilaterally behind her eyes, endorses a burning sensation, and complains of significant tension and pain in her neck, and back.  Patient does endorse significant photophobia, as well as nausea without vomiting, however the headache is not unilateral in nature is located on both sides of the head.  Patient denies any vision changes, visual disturbance, unilateral weakness, confusion.  Patient reports that she has experienced a headache like this 1 other time in the past when she was first diagnosed with migraines.  Attempted to take ibuprofen, Tylenol, fluids, and sumatriptan that was prescribed last night with no relief.  Patient does endorse some positional nature of the headache with worsening on standing up, improvement with laying down.  Patient denies recent lumbar puncture or other spinal instrumentation.  Patient reports multiple prior evaluations including multiple CTs of the head without any known findings.  Migraine Associated symptoms include headaches.      Past Medical History:  Diagnosis Date   Anemia    Migraine    Ovarian cyst     Patient Active Problem List   Diagnosis Date Noted   Migraine headache with aura 11/05/2015    Past Surgical History:  Procedure Laterality Date   OOPHORECTOMY Right    ORIF  ANKLE FRACTURE Right 12/31/2020   Procedure: OPEN REDUCTION INTERNAL FIXATION (ORIF) RIGHT ANKLE FRACTURE;  Surgeon: Vanetta Mulders, MD;  Location: [place];  Service: Orthopedics;  Laterality: Right;     OB History     Gravida  2   Para  1   Term  1   Preterm      AB      Living  1      SAB      IAB      Ectopic      Multiple  0   Live Births  1           Family History  Problem Relation Age of Onset   Hypertension Maternal Grandmother    Cancer Maternal Grandfather        pancreatic    Social History   Tobacco Use   Smoking status: Former    Packs/day: 0.50    Types: Cigarettes, Cigars   Smokeless tobacco: Former   Tobacco comments:    quit with preg  Vaping Use   Vaping Use: Never used  Substance Use Topics   Alcohol use: No   Drug use: No    Home Medications Prior to Admission medications   Medication Sig Start Date End Date Taking? Authorizing Provider  methocarbamol (ROBAXIN) 500 MG tablet Take 1 tablet (500 mg total) by mouth every 8 (eight) hours as needed for muscle spasms. You may take this medication for headaches with muscular pain as we discussed 03/29/21  Yes Inette Doubrava H, PA-C  aspirin  EC 325 MG tablet Take 1 tablet (325 mg total) by mouth daily for 30 days. 12/30/20   Vanetta Mulders, MD  cetirizine (ZYRTEC) 10 MG tablet Take 10 mg by mouth every other day.    [provider]  oxyCODONE (OXY IR/ROXICODONE) 5 MG immediate release tablet Take 1 tablet (5 mg total) by mouth every 4 (four) hours as needed (severe pain). 01/02/21   Vanetta Mulders, MD  oxycodone (OXY-IR) 5 MG capsule Take 1 capsule (5 mg total) by mouth every 4 (four) hours as needed (severe pain). 01/19/21   Vanetta Mulders, MD  silver sulfADIAZINE (SILVADENE) 1 % cream Apply 1 application topically daily. 03/02/21   Vanetta Mulders, MD  SUMAtriptan (IMITREX) 100 MG tablet Take 1 tablet (100 mg total) by mouth every 2 (two) hours as needed for migraine. May repeat  in 2 hours if headache persists or recurs. 03/28/21   Drenda Freeze, MD    Allergies    Patient has no known allergies.  Review of Systems   Review of Systems  Eyes:  Positive for photophobia.  Musculoskeletal:  Positive for back pain and myalgias.  Neurological:  Positive for headaches.  All other systems reviewed and are negative.  Physical Exam Updated Vital Signs BP 118/73   Pulse 74   Temp 98.9 F (37.2 C)   Resp 20   LMP 03/10/2021 (Approximate)   SpO2 99%   Physical Exam Vitals and nursing note reviewed.  Constitutional:      General: She is in acute distress.     Appearance: Normal appearance. She is obese. She is not ill-appearing or toxic-appearing.  HENT:     Head: Normocephalic and atraumatic.  Eyes:     General:        Right eye: No discharge.        Left eye: No discharge.  Neck:     Comments: Some tight cervical musculature noted without any midline spinal tenderness.  Patient with intact range of motion of the neck.  No evidence of meningeal signs.  Negative Kernig, negative present ski. Cardiovascular:     Rate and Rhythm: Normal rate and regular rhythm.     Heart sounds: No murmur heard.   No friction rub. No gallop.  Pulmonary:     Effort: Pulmonary effort is normal.     Breath sounds: Normal breath sounds.  Abdominal:     General: Bowel sounds are normal.     Palpations: Abdomen is soft.  Musculoskeletal:     Cervical back: Normal range of motion and neck supple. Tenderness present. No rigidity.  Skin:    General: Skin is warm and dry.     Capillary Refill: Capillary refill takes less than 2 seconds.  Neurological:     Mental Status: She is alert and oriented to person, place, and time.     Comments: Cranial nerves III through XII grossly intact.  Intact strength 5 out of 5 bilateral upper and lower extremities.  Romberg negative, gait normal.  Pronator drift negative.  Intact finger-nose.  Intact heel-to-shin.  Patient is alert and  oriented x3.  Psychiatric:        Mood and Affect: Mood normal.        Behavior: Behavior normal.    ED Results / Procedures / Treatments   Labs (all labs ordered are listed, but only abnormal results are displayed) Labs Reviewed  CBC WITH DIFFERENTIAL/PLATELET - Abnormal; Notable for the following components:      Result Value  Hemoglobin 10.0 (*)    HCT 33.6 (*)    MCV 72.6 (*)    MCH 21.6 (*)    MCHC 29.8 (*)    RDW 20.2 (*)    All other components within normal limits  COMPREHENSIVE METABOLIC PANEL - Abnormal; Notable for the following components:   Glucose, Bld 104 (*)    BUN <5 (*)    AST 7 (*)    All other components within normal limits  RESP PANEL BY RT-PCR (FLU A&B, COVID) ARPGX2  HCG, SERUM, QUALITATIVE    EKG None  Radiology CT ANGIO HEAD NECK W WO CM  Result Date: 03/29/2021 CLINICAL DATA:  Vertebral artery dissection; Dural venous sinus thrombosis suspected EXAM: CT ANGIOGRAPHY HEAD AND NECK TECHNIQUE: Multidetector CT imaging of the head and neck was performed using the standard protocol during bolus administration of intravenous contrast. Multiplanar CT image reconstructions and MIPs were obtained to evaluate the vascular anatomy. Carotid stenosis measurements (when applicable) are obtained utilizing NASCET criteria, using the distal internal carotid diameter as the denominator. Repeat imaging through the head was performed for CT venography. Multi planar CT image reconstructions and MIPs were obtained to evaluate the vascular anatomy. CONTRAST:  116mL OMNIPAQUE IOHEXOL 350 MG/ML SOLN COMPARISON:  None. FINDINGS: CTA NECK Aortic arch: Great vessel origins are patent. Right carotid system: Patent.  No stenosis. Left carotid system: Patent.  No stenosis. Vertebral arteries: Patent.  No stenosis. Skeleton: Unremarkable. Other neck: Unremarkable. Upper chest: Head included upper lungs are clear. Review of the MIP images confirms the above findings CTA HEAD Anterior  circulation: Intracranial internal carotid arteries are patent. Anterior and middle cerebral arteries are patent. Posterior circulation: Intracranial vertebral arteries are patent. Basilar artery is patent. Posterior cerebral arteries are patent. Review of the MIP images confirms the above findings CTV HEAD Superior sagittal sinus, straight sinus, vein of Galen, internal cerebral veins and basal veins are patent. Transverse and sigmoid sinuses are patent. IMPRESSION: No large vessel occlusion, hemodynamically significant stenosis, or evidence of dissection. No evidence of dural sinus thrombosis. Electronically Signed   By: Macy Mis M.D.   On: 03/29/2021 20:57   CT Head Wo Contrast  Result Date: 03/29/2021 CLINICAL DATA:  Headache. EXAM: CT HEAD WITHOUT CONTRAST TECHNIQUE: Contiguous axial images were obtained from the base of the skull through the vertex without intravenous contrast. COMPARISON:  October 02, 2018 FINDINGS: Brain: No evidence of acute infarction, hemorrhage, hydrocephalus, extra-axial collection or mass lesion/mass effect. Vascular: No hyperdense vessel or unexpected calcification. Skull: Normal. Negative for fracture or focal lesion. Sinuses/Orbits: No acute finding. Other: None. IMPRESSION: No acute intracranial abnormality. Electronically Signed   By: Fidela Salisbury M.D.   On: 03/29/2021 17:29   CT VENOGRAM HEAD  Result Date: 03/29/2021 CLINICAL DATA:  Vertebral artery dissection; Dural venous sinus thrombosis suspected EXAM: CT ANGIOGRAPHY HEAD AND NECK TECHNIQUE: Multidetector CT imaging of the head and neck was performed using the standard protocol during bolus administration of intravenous contrast. Multiplanar CT image reconstructions and MIPs were obtained to evaluate the vascular anatomy. Carotid stenosis measurements (when applicable) are obtained utilizing NASCET criteria, using the distal internal carotid diameter as the denominator. Repeat imaging through the head was  performed for CT venography. Multi planar CT image reconstructions and MIPs were obtained to evaluate the vascular anatomy. CONTRAST:  154mL OMNIPAQUE IOHEXOL 350 MG/ML SOLN COMPARISON:  None. FINDINGS: CTA NECK Aortic arch: Great vessel origins are patent. Right carotid system: Patent.  No stenosis. Left carotid system: Patent.  No stenosis. Vertebral arteries: Patent.  No stenosis. Skeleton: Unremarkable. Other neck: Unremarkable. Upper chest: Head included upper lungs are clear. Review of the MIP images confirms the above findings CTA HEAD Anterior circulation: Intracranial internal carotid arteries are patent. Anterior and middle cerebral arteries are patent. Posterior circulation: Intracranial vertebral arteries are patent. Basilar artery is patent. Posterior cerebral arteries are patent. Review of the MIP images confirms the above findings CTV HEAD Superior sagittal sinus, straight sinus, vein of Galen, internal cerebral veins and basal veins are patent. Transverse and sigmoid sinuses are patent. IMPRESSION: No large vessel occlusion, hemodynamically significant stenosis, or evidence of dissection. No evidence of dural sinus thrombosis. Electronically Signed   By: Macy Mis M.D.   On: 03/29/2021 20:57    Procedures Procedures   Medications Ordered in ED Medications  sodium chloride 0.9 % bolus 1,000 mL (0 mLs Intravenous Stopped 03/29/21 1946)  prochlorperazine (COMPAZINE) injection 10 mg (10 mg Intravenous Given 03/29/21 1606)  diphenhydrAMINE (BENADRYL) injection 25 mg (25 mg Intravenous Given 03/29/21 1606)  methocarbamol (ROBAXIN) injection 1,000 mg (1,000 mg Intravenous Given 03/29/21 1605)  morphine 4 MG/ML injection 4 mg (4 mg Intravenous Given 03/29/21 1606)  morphine 4 MG/ML injection 4 mg (4 mg Intravenous Given 03/29/21 1800)  magnesium sulfate IVPB 2 g 50 mL (0 g Intravenous Stopped 03/29/21 1915)  dexamethasone (DECADRON) injection 10 mg (10 mg Intravenous Given 03/29/21 1853)  iohexol  (OMNIPAQUE) 350 MG/ML injection 100 mL (100 mLs Intravenous Contrast Given 03/29/21 2024)  ketorolac (TORADOL) 15 MG/ML injection 15 mg (15 mg Intravenous Given 03/29/21 2144)  morphine 4 MG/ML injection 4 mg (4 mg Intravenous Given 03/29/21 2143)  methocarbamol (ROBAXIN) injection 1,000 mg (1,000 mg Intravenous Given 03/29/21 2149)    ED Course  I have reviewed the triage vital signs and the nursing notes.  Pertinent labs & imaging results that were available during my care of the patient were reviewed by me and considered in my medical decision making (see chart for details).    MDM Rules/Calculators/A&P                         I discussed this case with my attending physician who cosigned this note including patient's presenting symptoms, physical exam, and planned diagnostics and interventions. Attending physician stated agreement with plan or made changes to plan which were implemented.   Attending physician assessed patient at bedside.  Ill-appearing patient who presents as a bounce back with now 48 to 72 hours of intractable headache.  Patient with diagnosis of chronic migraines in the past.  Patient reports that the headache was initially gradual in onset, and escalated to 13/10 when she was evaluated last night.  Without any recent fever.  Patient without any meningeal signs.  Patient without any focal neurologic deficit.  However given duration of complaint, and intractability after further evaluation and patient received significant migraine cocktail yesterday do believe that she requires further evaluation at this time.  Screening lab work performed and without abnormality other than mild anemia, mildly elevated glucose of 104.  CT of the head without any abnormality.  RVP negative.  Patient remains afebrile.  In addition to normal migraine cocktail we will administer Robaxin intravenously at this time as patient has a primary complaint of tension in the neck and shoulders that may be a  major contributor to her headache.  On initial reevaluation patient's pain was improved to 10/10, patient given an additional dose of morphine,  magnesium sulfate, Decadron at this time.  Spoke with Dr. Donnald Garre who further evaluated the patient, agrees with my assessment of no focal neurologic finding, no meningeal signs.  Given bilateral nature of patient's presentation, and primary complaint of tension in the neck and shoulders some but this may be more of a tension headache or other abnormality than a true migraine although photophobia, nausea do agree with normal migraine presentation.  Concern for venous thrombosis, or other abnormality in the vasculature of the head given intractable nature, CT angio and CT venogram performed at this time.  All testing without any significant clinical findings.  Patient given additional dose of morphine, Robaxin, Toradol at this time and experience spontaneous reduction of pain to 0/10.  Patient discharged in stable condition at this time, and continued to be neurologically intact throughout the entirety of her reevaluation and discharge.  Given the severity of her headache, and the chronic nature of these headaches do recommend close follow-up with neurology for further evaluation.  Will discharge patient with prescription for Robaxin, encouragement to increase fluid intake, decrease screen time, and continue other treatments including sumatriptan as discussed.  Patient understands and agrees to plan, and discharged in stable condition at this time. Final Clinical Impression(s) / ED Diagnoses Final diagnoses:  Acute non intractable tension-type headache    Rx / DC Orders ED Discharge Orders          Ordered    methocarbamol (ROBAXIN) 500 MG tablet  Every 8 hours PRN        03/29/21 2236             Olene Floss, PA-C 03/29/21 2314    Arby Barrette, MD 04/01/21 1814

## 2021-03-29 NOTE — ED Notes (Signed)
Pt last took imitrex this morning at approx 9am

## 2021-03-29 NOTE — ED Triage Notes (Signed)
Pt reports migraine for past 2 days, seen in ED last night, sent home with Rx - pt reports no relief and headache and pressure continues, along with light sensitivity.

## 2021-03-29 NOTE — Discharge Instructions (Addendum)
Is a pleasure taking care of you today I am glad that your headache is resolved at this time.  Encourage plenty of fluids and rest.  Try to avoid looking at bright screens, you may want to try some bluelight glasses.  That you are having these chronic migraines I do recommend that you follow-up with a neurologist to discuss potential causes, potential treatments that may help to prevent migraines.  Use the muscle relaxant medication that I am prescribing up to 3 times daily as needed for headache symptoms with ocular pain that you are experiencing.  I encourage additionally trying Tylenol, ibuprofen, and Excedrin as you have been doing as these anti-inflammatory medications and additional pain medications may help to target the headaches in a different way that works for your pain.  The sumatriptan medication that you received yesterday may also be used if you believe that it might be helpful.  Please return if your pain returns at the severity that it was today, especially if you are experiencing weakness, confusion, visual changes.

## 2021-03-29 NOTE — ED Triage Notes (Signed)
Pt reports muscle tension in neck and shoulders contributing to pain.

## 2021-04-09 ENCOUNTER — Encounter: Payer: Self-pay | Admitting: Psychology
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
## 2021-05-01 ENCOUNTER — Encounter: Payer: Self-pay | Admitting: Psychiatry

## 2021-05-01 ENCOUNTER — Ambulatory Visit: Payer: Medicaid Other | Admitting: Psychiatry

## 2021-05-01 VITALS — BP 118/75 | HR 98 | Ht 65.0 in | Wt 275.0 lb

## 2021-05-01 DIAGNOSIS — G43709 Chronic migraine without aura, not intractable, without status migrainosus: Secondary | ICD-10-CM | POA: Diagnosis not present

## 2021-05-01 MED ORDER — TOPIRAMATE 25 MG PO TABS: ORAL_TABLET | ORAL | 3 refills | Status: DC

## 2021-05-01 MED ORDER — RIZATRIPTAN BENZOATE 10 MG PO TABS: 10.0000 mg | ORAL_TABLET | ORAL | 2 refills | Status: DC | PRN

## 2021-05-01 NOTE — Patient Instructions (Addendum)
Start Topamax (topiramate) for headache prevention. Take 25 mg (1 pill) at bedtime for one week, then increase to 50 mg (2 pills) at bedtime for one week, then take 75 mg (3 pills) at bedtime for one week, then take 100 mg (4 pills) at bedtime Start Maxalt (rizatriptan) as needed for migraines. Take at the onset of migraine. If headache recurs or does not fully resolve, you may take a second dose after 2 hours. Please avoid taking more than 2 days per week Follow up in 3 months

## 2021-05-19 ENCOUNTER — Ambulatory Visit (HOSPITAL_BASED_OUTPATIENT_CLINIC_OR_DEPARTMENT_OTHER): Payer: Medicaid Other | Admitting: Physical Therapy

## 2021-06-04 ENCOUNTER — Ambulatory Visit (HOSPITAL_BASED_OUTPATIENT_CLINIC_OR_DEPARTMENT_OTHER): Payer: Medicaid Other | Attending: Orthopaedic Surgery | Admitting: Physical Therapy

## 2021-06-04 DIAGNOSIS — R262 Difficulty in walking, not elsewhere classified: Secondary | ICD-10-CM | POA: Insufficient documentation

## 2021-06-04 DIAGNOSIS — M6281 Muscle weakness (generalized): Secondary | ICD-10-CM | POA: Insufficient documentation

## 2021-06-04 DIAGNOSIS — M25671 Stiffness of right ankle, not elsewhere classified: Secondary | ICD-10-CM | POA: Insufficient documentation

## 2021-06-04 DIAGNOSIS — M25571 Pain in right ankle and joints of right foot: Secondary | ICD-10-CM | POA: Insufficient documentation

## 2021-08-04 NOTE — Progress Notes (Deleted)
? ?  CC:  headaches ? ?Follow-up Visit ? ?Last visit: 05/01/21 ? ?Brief HPI: ?31 year old female who follows in clinic for chronic migraines. CTH/CTV/CTA on 03/29/21 was unremarkable. ? ?At her last visit she was started on Topamax for prevention and Maxalt for rescue. ?Interval History: ?*** ? ? ?Headache days per month: *** ?Headache free days per month: *** ?Headache severity: *** ? ?Current Headache Regimen: ?Preventative: *** ?Abortive: *** ? ?# of doses of abortive medications per month: *** ? ?Prior Therapies                                  ?Imitrex 100 mg PRN ?Maxalt 10 mg PRN ?Robaxin 500 mg PRN ?Toradol ?Topamax ? ?Physical Exam:  ? ?Vital Signs: ?There were no vitals taken for this visit. ?GENERAL:  well appearing, in no acute distress, alert  ?SKIN:  Color, texture, turgor normal. No rashes or lesions ?HEAD:  Normocephalic/atraumatic. ?RESP: normal respiratory effort ?MSK:  No gross joint deformities.  ? ?NEUROLOGICAL: ?Mental Status: Alert, oriented to person, place and time, Follows commands, and Speech fluent and appropriate. ?Cranial Nerves: PERRL, face symmetric, no dysarthria, hearing grossly intact ?Motor: moves all extremities equally ?Gait: normal-based. ? ?IMPRESSION: ?*** ? ?PLAN: ?*** ? ? ?Follow-up: *** ? ?I spent a total of *** minutes on the date of the service. Headache education was done. Discussed lifestyle modification including increased oral hydration, decreased caffeine, exercise and stress management. Discussed treatment options including preventive and acute medications, natural supplements, and infusion therapy. Discussed medication overuse headache and to limit use of acute treatments to no more than 2 days/week or 10 days/month. Discussed medication side effects, adverse reactions and drug interactions. Written educational materials and patient instructions outlining all of the above were given. ? ?Ocie Doyne, MD ? ?

## 2021-08-05 ENCOUNTER — Ambulatory Visit: Payer: Medicaid Other | Admitting: Psychiatry

## 2021-08-05 ENCOUNTER — Encounter: Payer: Self-pay | Admitting: Psychiatry

## 2021-10-14 ENCOUNTER — Encounter: Payer: Medicaid Other | Admitting: Psychology

## 2022-04-26 NOTE — L&D Delivery Note (Cosign Needed Addendum)
OB/GYN Faculty Practice Delivery Note  Cynthia Horne is a 32 y.o. G3P1011 s/p SVD at [redacted]w[redacted]d. She was admitted for IOL 2/2 GDM.  ROM: 10h 54m with Clear fluid and small terminal meconium GBS Status: PRESUMPTIVE NEGATIVE PCR/-- (10/27 1220) Maximum Maternal Temperature: 98.4 F  Labor Progress: Patient presented to L&D for IOL 2/2 GDM. Initial SVE: 3/100/ballotable. Patient received dual Cytotec and started on pitocin after 4 hour recheck.  AROM was performed.  FSE and IUPC were placed after some variable decels.  She then progressed to complete.   Delivery Date/Time: 10/28 0242 Delivery: Called to room and patient was complete and pushing. Head delivered LOA with posterior (L) compound arm closely following. No nuchal cord present. Shoulder and body delivered in usual fashion. Infant with spontaneous cry, placed on mother's abdomen, dried and stimulated. Cord clamped x 2 after 1-minute delay, and cut by FOB. Cord blood drawn. Placenta delivered spontaneously with gentle cord traction. Fundus firm with massage and Pitocin. Labia, perineum, vagina, and cervix inspected with small perineal abrasion not requiring repair. Placenta was examined after and found to be intact with normal 3-vessel cord.  Placenta: Intact, 3 vessel cord Complications: None Lacerations: Small perineal laceration not requiring suture EBL: 47 mL Analgesia: Epidural  Postpartum Planning [x]  message to sent to schedule follow-up  [x]  vaccines UTD  Infant: APGARs 9/9  3900gm  Dimitry Shitarev [facility] PGY-1 02/21/23 2:58 AM  GME ATTESTATION:  Evaluation and management procedures were performed by the [facility] Medicine Resident under my supervision. I was immediately available and gloved for direct supervision, assistance and direction throughout this encounter.  I also confirm that I have verified the information documented in the resident's note, and that I have also personally reperformed the pertinent components of the  physical exam and all of the medical decision making activities.  I have also made any necessary editorial changes.  Wyn Forster, MD OB Fellow, Faculty Practice [facility] 02/21/2023 3:19 AM

## 2022-04-27 ENCOUNTER — Encounter: Payer: Medicaid Other | Attending: Psychology | Admitting: Psychology
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
## 2022-05-27 ENCOUNTER — Telehealth: Payer: Self-pay | Admitting: Psychiatry

## 2022-05-27 ENCOUNTER — Ambulatory Visit (INDEPENDENT_AMBULATORY_CARE_PROVIDER_SITE_OTHER): Payer: Medicaid Other | Admitting: Psychiatry

## 2022-05-27 ENCOUNTER — Encounter: Payer: Self-pay | Admitting: Psychiatry

## 2022-05-27 VITALS — BP 120/46 | HR 82 | Ht 65.0 in | Wt 301.5 lb

## 2022-05-27 DIAGNOSIS — G43E19 Chronic migraine with aura, intractable, without status migrainosus: Secondary | ICD-10-CM | POA: Diagnosis not present

## 2022-05-27 DIAGNOSIS — G510 Bell's palsy: Secondary | ICD-10-CM | POA: Diagnosis not present

## 2022-05-27 MED ORDER — UBRELVY 100 MG PO TABS: 100.0000 mg | ORAL_TABLET | ORAL | 6 refills | Status: DC | PRN

## 2022-05-27 MED ORDER — PROPRANOLOL HCL 20 MG PO TABS: 20.0000 mg | ORAL_TABLET | Freq: Two times a day (BID) | ORAL | 3 refills | Status: DC

## 2022-05-27 NOTE — Telephone Encounter (Signed)
[employer] medicaid Josem Kaufmann: [id number] exp. 05/27/22-07/11/22 sent to [place]

## 2022-06-02 ENCOUNTER — Encounter: Payer: Self-pay | Admitting: Psychiatry

## 2022-06-02 MED ORDER — EMGALITY 120 MG/ML ~~LOC~~ SOAJ: 2.0000 | Freq: Once | SUBCUTANEOUS | 0 refills | Status: AC

## 2022-06-02 MED ORDER — EMGALITY 120 MG/ML ~~LOC~~ SOAJ: 1.0000 | SUBCUTANEOUS | 6 refills | Status: DC

## 2022-06-02 NOTE — Telephone Encounter (Signed)
Dr.Chima   Patient reports the propranolol is not working for her daily headaches.

## 2022-06-02 NOTE — Telephone Encounter (Signed)
I sent an rx for Emgality to her pharmacy

## 2022-06-03 ENCOUNTER — Ambulatory Visit (HOSPITAL_BASED_OUTPATIENT_CLINIC_OR_DEPARTMENT_OTHER)
Admission: RE | Admit: 2022-06-03 | Discharge: 2022-06-03 | Disposition: A | Discharge status: Home / Self Care | Payer: Medicaid Other | Source: Ambulatory Visit | Attending: Psychiatry | Admitting: Psychiatry

## 2022-06-03 DIAGNOSIS — G510 Bell's palsy: Secondary | ICD-10-CM | POA: Diagnosis present

## 2022-06-03 MED ORDER — IOHEXOL 300 MG/ML  SOLN
75.0000 mL | Freq: Once | INTRAMUSCULAR | Status: AC | PRN
  Administered 2022-06-03: 75 mL via INTRAVENOUS

## 2022-06-07 ENCOUNTER — Other Ambulatory Visit: Payer: Self-pay | Admitting: Psychiatry

## 2022-06-07 DIAGNOSIS — G510 Bell's palsy: Secondary | ICD-10-CM

## 2022-06-08 ENCOUNTER — Telehealth: Payer: Self-pay | Admitting: Pharmacy Technician

## 2022-06-08 NOTE — Telephone Encounter (Signed)
Patient Advocate Encounter   Received notification that prior authorization for Ubrelvy 100MG tablets is required.   PA submitted on 06/08/2022 Key [id number] Status is pending       Lyndel Safe, [place] Patient Advocate Specialist [place] Patient Advocate Team Direct Number: [phone]  Fax: [phone]

## 2022-06-09 ENCOUNTER — Telehealth: Payer: Self-pay | Admitting: Psychiatry

## 2022-06-09 NOTE — Telephone Encounter (Signed)
[employer] medicaid Josem KaufmannGY:[id number] exp. 06/09/21-07/24/22 sent to [facility] [phone]

## 2022-06-11 NOTE — Telephone Encounter (Signed)
Patient Advocate Encounter  Prior Authorization for Ubrelvy 100MG tablets has been approved.    PA# [id number] Effective dates: 06/08/2022 through 06/09/2023      Lyndel Safe, [place] Patient Advocate Specialist [place] Patient Advocate Team Direct Number: [phone]  Fax: [phone]

## 2022-06-28 ENCOUNTER — Inpatient Hospital Stay: Admission: RE | Admit: 2022-06-28 | Payer: Medicaid Other | Source: Ambulatory Visit

## 2022-07-12 ENCOUNTER — Ambulatory Visit: Payer: 59

## 2022-07-29 ENCOUNTER — Ambulatory Visit: Payer: 59 | Admitting: Orthopedic Surgery

## 2022-08-09 ENCOUNTER — Ambulatory Visit: Payer: 59 | Admitting: Orthopedic Surgery

## 2022-08-28 ENCOUNTER — Inpatient Hospital Stay (HOSPITAL_COMMUNITY)
Admission: AD | Admit: 2022-08-28 | Discharge: 2022-08-28 | Disposition: A | Discharge status: Home / Self Care | Payer: BLUE CROSS/BLUE SHIELD | Attending: Obstetrics and Gynecology | Admitting: Obstetrics and Gynecology

## 2022-08-28 ENCOUNTER — Inpatient Hospital Stay (HOSPITAL_BASED_OUTPATIENT_CLINIC_OR_DEPARTMENT_OTHER): Payer: BLUE CROSS/BLUE SHIELD

## 2022-08-28 ENCOUNTER — Encounter (HOSPITAL_COMMUNITY): Payer: Self-pay | Admitting: *Deleted

## 2022-08-28 DIAGNOSIS — Z3492 Encounter for supervision of normal pregnancy, unspecified, second trimester: Secondary | ICD-10-CM

## 2022-08-28 DIAGNOSIS — Z3A14 14 weeks gestation of pregnancy: Secondary | ICD-10-CM

## 2022-08-28 DIAGNOSIS — O4692 Antepartum hemorrhage, unspecified, second trimester: Secondary | ICD-10-CM

## 2022-08-28 DIAGNOSIS — O209 Hemorrhage in early pregnancy, unspecified: Secondary | ICD-10-CM | POA: Insufficient documentation

## 2022-08-28 DIAGNOSIS — E669 Obesity, unspecified: Secondary | ICD-10-CM

## 2022-08-28 DIAGNOSIS — O99212 Obesity complicating pregnancy, second trimester: Secondary | ICD-10-CM | POA: Diagnosis not present

## 2022-08-28 DIAGNOSIS — O210 Mild hyperemesis gravidarum: Secondary | ICD-10-CM | POA: Diagnosis not present

## 2022-08-28 LAB — COMPREHENSIVE METABOLIC PANEL
ALT: 9 U/L (ref 0–44)
AST: 14 U/L — ABNORMAL LOW (ref 15–41)
Albumin: 3.2 g/dL — ABNORMAL LOW (ref 3.5–5.0)
Alkaline Phosphatase: 63 U/L (ref 38–126)
Anion gap: 9 (ref 5–15)
BUN: 7 mg/dL (ref 6–20)
CO2: 24 mmol/L (ref 22–32)
Calcium: 11.1 mg/dL — ABNORMAL HIGH (ref 8.9–10.3)
Chloride: 102 mmol/L (ref 98–111)
Creatinine, Ser: 0.68 mg/dL (ref 0.44–1.00)
GFR, Estimated: >60 mL/min (ref >60)
Glucose, Bld: 107 mg/dL — ABNORMAL HIGH (ref 70–99)
Potassium: 4 mmol/L (ref 3.5–5.1)
Sodium: 135 mmol/L (ref 135–145)
Total Bilirubin: 0.2 mg/dL — ABNORMAL LOW (ref 0.3–1.2)
Total Protein: 7 g/dL (ref 6.5–8.1)

## 2022-08-28 LAB — HCG, QUANTITATIVE, PREGNANCY: hCG, Beta Chain, Quant, S: 62383 m[IU]/mL — ABNORMAL HIGH (ref <5)

## 2022-08-28 LAB — URINALYSIS, ROUTINE W REFLEX MICROSCOPIC
Bilirubin Urine: NEGATIVE
Glucose, UA: NEGATIVE mg/dL
Hgb urine dipstick: NEGATIVE
Ketones, ur: 5 mg/dL — AB
Leukocytes,Ua: NEGATIVE
Nitrite: NEGATIVE
Protein, ur: NEGATIVE mg/dL
Specific Gravity, Urine: 1.02 (ref 1.005–1.030)
pH: 5 (ref 5.0–8.0)

## 2022-08-28 LAB — CBC
HCT: 34.3 % — ABNORMAL LOW (ref 36.0–46.0)
Hemoglobin: 10.7 g/dL — ABNORMAL LOW (ref 12.0–15.0)
MCH: 26.1 pg (ref 26.0–34.0)
MCHC: 31.2 g/dL (ref 30.0–36.0)
MCV: 83.7 fL (ref 80.0–100.0)
Platelets: 270 10*3/uL (ref 150–400)
RBC: 4.1 MIL/uL (ref 3.87–5.11)
RDW: 18.6 % — ABNORMAL HIGH (ref 11.5–15.5)
WBC: 12.1 10*3/uL — ABNORMAL HIGH (ref 4.0–10.5)
nRBC: 0 % (ref 0.0–0.2)

## 2022-08-28 LAB — WET PREP, GENITAL
Sperm: NONE SEEN
Trich, Wet Prep: NONE SEEN
WBC, Wet Prep HPF POC: >=10 — AB (ref <10)
Yeast Wet Prep HPF POC: NONE SEEN

## 2022-08-28 MED ORDER — SCOPOLAMINE 1 MG/3DAYS TD PT72
1.0000 | MEDICATED_PATCH | TRANSDERMAL | Status: DC
  Administered 2022-08-28: 1.5 mg via TRANSDERMAL
  Filled 2022-08-28: qty 1

## 2022-08-28 MED ORDER — SCOPOLAMINE 1 MG/3DAYS TD PT72: 1.0000 | MEDICATED_PATCH | TRANSDERMAL | 12 refills | Status: DC

## 2022-08-28 MED ORDER — LACTATED RINGERS IV BOLUS
1000.0000 mL | Freq: Once | INTRAVENOUS | Status: AC
  Administered 2022-08-28: 1000 mL via INTRAVENOUS

## 2022-08-28 MED ORDER — METOCLOPRAMIDE HCL 5 MG/ML IJ SOLN
10.0000 mg | Freq: Once | INTRAMUSCULAR | Status: AC
  Administered 2022-08-28: 10 mg via INTRAVENOUS
  Filled 2022-08-28: qty 2

## 2022-08-28 MED ORDER — GLYCOPYRROLATE 0.2 MG/ML IJ SOLN
0.1000 mg | Freq: Once | INTRAMUSCULAR | Status: AC
  Administered 2022-08-28: 0.1 mg via INTRAVENOUS
  Filled 2022-08-28: qty 1

## 2022-08-28 MED ORDER — METOCLOPRAMIDE HCL 10 MG PO TABS: 10.0000 mg | ORAL_TABLET | Freq: Four times a day (QID) | ORAL | 0 refills | Status: DC | PRN

## 2022-08-28 NOTE — MAU Provider Note (Signed)
History     CSN: [id number]  Arrival date and time: 08/28/22 1629   Event Date/Time   First Provider Initiated Contact with Patient 08/28/22 1734      Chief Complaint  Patient presents with   Emesis   31 y.o. G3P1011 @13 .6 wks presenting with N/V. Reports sx started a few weeks ago. She has tried Paramedic but its not helping. States she cannot tolerate much po over the last few days, only water, apple juice, or cranberry juice. Vomited 4 times today. Reports excessive saliva. Reports 10-15 lb weight loss. Also reports 2 days of spotting earlier in the week. No bleeding or pain today.     OB History     Gravida  3   Para  1   Term  1   Preterm      AB  1   Living  1      SAB  1   IAB      Ectopic      Multiple  0   Live Births  1           Past Medical History:  Diagnosis Date   Anemia    Migraine    Ovarian cyst     Past Surgical History:  Procedure Laterality Date   OOPHORECTOMY Right    ORIF ANKLE FRACTURE Right 12/31/2020   Procedure: OPEN REDUCTION INTERNAL FIXATION (ORIF) RIGHT ANKLE FRACTURE;  Surgeon: Huel Cote, MD;  Location: [facility];  Service: Orthopedics;  Laterality: Right;    Family History  Problem Relation Age of Onset   Migraines Maternal Grandmother    Hypertension Maternal Grandmother    Cancer Maternal Grandfather        pancreatic    Social History   Tobacco Use   Smoking status: Former    Packs/day: .5    Types: Cigarettes, Cigars   Smokeless tobacco: Former   Tobacco comments:    quit with preg  Vaping Use   Vaping Use: Never used  Substance Use Topics   Alcohol use: No   Drug use: No    Allergies: No Known Allergies  Medications Prior to Admission  Medication Sig Dispense Refill Last Dose   Doxylamine-Pyridoxine (DICLEGIS PO) Take 10 mg by mouth at bedtime.   Past Week   cetirizine (ZYRTEC) 10 MG tablet Take 10 mg by mouth every other day.      Galcanezumab-gnlm (EMGALITY) 120 MG/ML SOAJ Inject 1 Pen  into the skin every 30 (thirty) days. 1.12 mL 6    methocarbamol (ROBAXIN) 500 MG tablet Take 1 tablet (500 mg total) by mouth every 8 (eight) hours as needed for muscle spasms. You may take this medication for headaches with muscular pain as we discussed 30 tablet 0    propranolol (INDERAL) 20 MG tablet Take 1 tablet (20 mg total) by mouth 2 (two) times daily. 60 tablet 3    Ubrogepant (UBRELVY) 100 MG TABS Take 1 tablet (100 mg total) by mouth as needed (for migraine). May repeat a dose in 2 hours if headache persist 16 tablet 6     Review of Systems  Constitutional:  Negative for fever.  Gastrointestinal:  Positive for nausea and vomiting. Negative for abdominal pain and diarrhea.  Genitourinary:  Positive for vaginal bleeding.   Physical Exam   Blood pressure 109/73, pulse (!) 113, temperature 98.1 F (36.7 C), resp. rate 18, height 5\' 5"  (1.651 m), weight 134.3 kg, SpO2 99 %, unknown if currently breastfeeding.  Physical  Exam Vitals and nursing note reviewed.  Constitutional:      General: She is not in acute distress.    Appearance: Normal appearance.  HENT:     Head: Normocephalic and atraumatic.  Cardiovascular:     Rate and Rhythm: Normal rate.  Pulmonary:     Effort: Pulmonary effort is normal. No respiratory distress.  Abdominal:     General: There is no distension.     Palpations: Abdomen is soft. There is no mass.     Tenderness: There is no abdominal tenderness. There is no guarding or rebound.     Hernia: No hernia is present.  Musculoskeletal:        General: Normal range of motion.     Cervical back: Normal range of motion.  Skin:    General: Skin is warm and dry.  Neurological:     General: No focal deficit present.     Mental Status: She is alert and oriented to person, place, and time.  Psychiatric:        Mood and Affect: Mood normal.        Behavior: Behavior normal.    Results for orders placed or performed during the hospital encounter of 08/28/22  (from the past 24 hour(s))  Urinalysis, Routine w reflex microscopic -Urine, Clean Catch     Status: Abnormal   Collection Time: 08/28/22  5:04 PM  Result Value Ref Range   Color, Urine YELLOW YELLOW   APPearance CLEAR CLEAR   Specific Gravity, Urine 1.020 1.005 - 1.030   pH 5.0 5.0 - 8.0   Glucose, UA NEGATIVE NEGATIVE mg/dL   Hgb urine dipstick NEGATIVE NEGATIVE   Bilirubin Urine NEGATIVE NEGATIVE   Ketones, ur 5 (A) NEGATIVE mg/dL   Protein, ur NEGATIVE NEGATIVE mg/dL   Nitrite NEGATIVE NEGATIVE   Leukocytes,Ua NEGATIVE NEGATIVE  CBC     Status: Abnormal   Collection Time: 08/28/22  5:52 PM  Result Value Ref Range   WBC 12.1 (H) 4.0 - 10.5 K/uL   RBC 4.10 3.87 - 5.11 MIL/uL   Hemoglobin 10.7 (L) 12.0 - 15.0 g/dL   HCT 16.1 (L) 09.6 - 04.5 %   MCV 83.7 80.0 - 100.0 fL   MCH 26.1 26.0 - 34.0 pg   MCHC 31.2 30.0 - 36.0 g/dL   RDW 40.9 (H) 81.1 - 91.4 %   Platelets 270 150 - 400 K/uL   nRBC 0.0 0.0 - 0.2 %  Wet prep, genital     Status: Abnormal   Collection Time: 08/28/22  5:52 PM   Specimen: PATH Cytology Cervicovaginal Ancillary Only  Result Value Ref Range   Yeast Wet Prep HPF POC NONE SEEN NONE SEEN   Trich, Wet Prep NONE SEEN NONE SEEN   Clue Cells Wet Prep HPF POC PRESENT (A) NONE SEEN   WBC, Wet Prep HPF POC >=10 (A) <10   Sperm NONE SEEN   Comprehensive metabolic panel     Status: Abnormal   Collection Time: 08/28/22  5:52 PM  Result Value Ref Range   Sodium 135 135 - 145 mmol/L   Potassium 4.0 3.5 - 5.1 mmol/L   Chloride 102 98 - 111 mmol/L   CO2 24 22 - 32 mmol/L   Glucose, Bld 107 (H) 70 - 99 mg/dL   BUN 7 6 - 20 mg/dL   Creatinine, Ser 7.82 0.44 - 1.00 mg/dL   Calcium 95.6 (H) 8.9 - 10.3 mg/dL   Total Protein 7.0 6.5 - 8.1 g/dL  Albumin 3.2 (L) 3.5 - 5.0 g/dL   AST 14 (L) 15 - 41 U/L   ALT 9 0 - 44 U/L   Alkaline Phosphatase 63 38 - 126 U/L   Total Bilirubin 0.2 (L) 0.3 - 1.2 mg/dL   GFR, Estimated >40 >98 mL/min   Anion gap 9 5 - 15     Media  Information   Document Information  MAU Course  Procedures  MDM Labs and [place] ordered and reviewed.  Normal IUP on [place]- dates changed. Tolerating po, feeling better after meds and fluids.  Stable for discharge home.   Assessment and Plan   1. [redacted] weeks gestation of pregnancy   2. Morning sickness   3. Normal IUP (intrauterine pregnancy) on prenatal ultrasound, second trimester    Discharge home Follow up at [facility] to start [facility] Rx Scop Rx Reglan Return precautions  Allergies as of 08/28/2022   No Known Allergies      Medication List     STOP taking these medications    Emgality 120 MG/ML Soaj Generic drug: Galcanezumab-gnlm   methocarbamol 500 MG tablet Commonly known as: ROBAXIN   propranolol 20 MG tablet Commonly known as: INDERAL   Ubrelvy 100 MG Tabs Generic drug: Ubrogepant       TAKE these medications    cetirizine 10 MG tablet Commonly known as: ZYRTEC Take 10 mg by mouth every other day.   DICLEGIS PO Take 10 mg by mouth at bedtime.   metoCLOPramide 10 MG tablet Commonly known as: Reglan Take 1 tablet (10 mg total) by mouth every 6 (six) hours as needed for nausea.   scopolamine 1 MG/3DAYS Commonly known as: TRANSDERM-SCOP Place 1 patch (1.5 mg total) onto the skin every 3 (three) days. Start taking on: Aug 31, 2022        Donette Larry, CNM 08/28/2022, 7:44 PM

## 2022-08-28 NOTE — MAU Note (Signed)
.  Cynthia Horne is a 32 y.o. at Unknown here in MAU reporting: n/v x 2 weeks.  Has some nausea meds(unsure what it is) but its not working. Stomach hurts when she vomits. C/o headache because she hax not been able to keep anything down LMP:  Onset of complaint: 2 weeks Pain score: 5 Vitals:   08/28/22 1704  BP: 109/73  Pulse: (!) 113  Resp: 18  Temp: 98.1 F (36.7 C)  SpO2: 99%     ZOX:WRUEAV due to position Lab orders placed from triage:

## 2022-08-30 LAB — GC/CHLAMYDIA PROBE AMP (~~LOC~~) NOT AT ARMC
Chlamydia: NEGATIVE
Comment: NEGATIVE
Comment: NORMAL
Neisseria Gonorrhea: NEGATIVE

## 2022-08-31 ENCOUNTER — Ambulatory Visit (INDEPENDENT_AMBULATORY_CARE_PROVIDER_SITE_OTHER): Payer: Medicaid Other

## 2022-08-31 DIAGNOSIS — Z3480 Encounter for supervision of other normal pregnancy, unspecified trimester: Secondary | ICD-10-CM

## 2022-08-31 DIAGNOSIS — O0990 Supervision of high risk pregnancy, unspecified, unspecified trimester: Secondary | ICD-10-CM | POA: Insufficient documentation

## 2022-08-31 DIAGNOSIS — Z1339 Encounter for screening examination for other mental health and behavioral disorders: Secondary | ICD-10-CM

## 2022-08-31 DIAGNOSIS — O210 Mild hyperemesis gravidarum: Secondary | ICD-10-CM

## 2022-08-31 DIAGNOSIS — Z3A15 15 weeks gestation of pregnancy: Secondary | ICD-10-CM

## 2022-08-31 MED ORDER — METOCLOPRAMIDE HCL 10 MG PO TABS: 10.0000 mg | ORAL_TABLET | Freq: Four times a day (QID) | ORAL | 0 refills | Status: DC | PRN

## 2022-08-31 MED ORDER — SCOPOLAMINE 1 MG/3DAYS TD PT72: 1.0000 | MEDICATED_PATCH | TRANSDERMAL | 12 refills | Status: DC

## 2022-08-31 NOTE — Progress Notes (Signed)
New OB Intake  I connected with Cynthia Horne  on 08/31/22 at  1:10 PM EDT by telephone and verified that I am speaking with the correct person using two identifiers. Nurse is located at [facility] and pt is located at Home.  I discussed the limitations, risks, security and privacy concerns of performing an evaluation and management service by telephone and the availability of in person appointments. I also discussed with the patient that there may be a patient responsible charge related to this service. The patient expressed understanding and agreed to proceed.  I explained I am completing New OB Intake today. We discussed EDD of 02/20/23 that is based on first trimester u/s. Pt is G3/P1011. I reviewed her allergies, medications, Medical/Surgical/OB history, and appropriate screenings. I informed her of [facility] services. [facility] information placed in AVS. Based on history, this is a low risk pregnancy.  Patient Active Problem List   Diagnosis Date Noted   Migraine headache with aura 11/05/2015    Concerns addressed today  Delivery Plans Plans to deliver at [facility]: [facility]. Patient given information for [facility] Healthy Baby website for more information about [facility]. Patient is not interested in water birth. Offered upcoming OB visit with CNM to discuss further.  MyChart/Babyscripts MyChart access verified. I explained pt will have some visits in office and some virtually. Babyscripts instructions given and order placed. Patient verifies receipt of registration text/e-mail. Account successfully created and app downloaded.  Blood Pressure Cuff/Weight Scale Patient has access to BP cuff at home. Explained after first prenatal appt pt will check weekly and document in Babyscripts. Patient does have weight scale.  Anatomy US Explained first scheduled [place] will be around 19 weeks. Anatomy US TBD.  Labs Discussed Cynthia Horne genetic screening with patient. Would like both Panorama and Horizon  drawn at new OB visit. Routine prenatal labs needed.  COVID Vaccine Patient has had COVID vaccine.   Is patient a CenteringPregnancy candidate?  Declined Declined due to Group setting Not a candidate due to  If accepted,      Social Determinants of Health Food Insecurity: Patient denies food insecurity. WIC Referral: Patient is interested in referral to [facility].  Transportation: Patient expressed transportation needs. Childcare: Discussed no children allowed at ultrasound appointments. Offered childcare services; patient declines childcare services at this time.  Interested in [place]? If yes, send referral and doula dot phrase.   First visit review I reviewed new OB appt with patient. I explained they will have a provider visit that includes prenatal labs, pap smear, genetic screening, and discuss plan for pregnancy. Explained pt will be seen by Sharen Counter at first visit; encounter routed to appropriate provider. Explained that patient will be seen by pregnancy navigator following visit with provider.   Cynthia Capri, RN 08/31/2022  1:25 PM

## 2022-09-04 IMAGING — DX DG ANKLE COMPLETE 3+V*R*
3 series · 3 of 3 positions shown · non-contrast
Comparison: Previous examinations including the study of 02/06/2021

CLINICAL DATA: Previous internal fixation of fractures of distal
right tibia and fibula

EXAM:
RIGHT ANKLE - COMPLETE 3+ VIEW

[ankle ap]
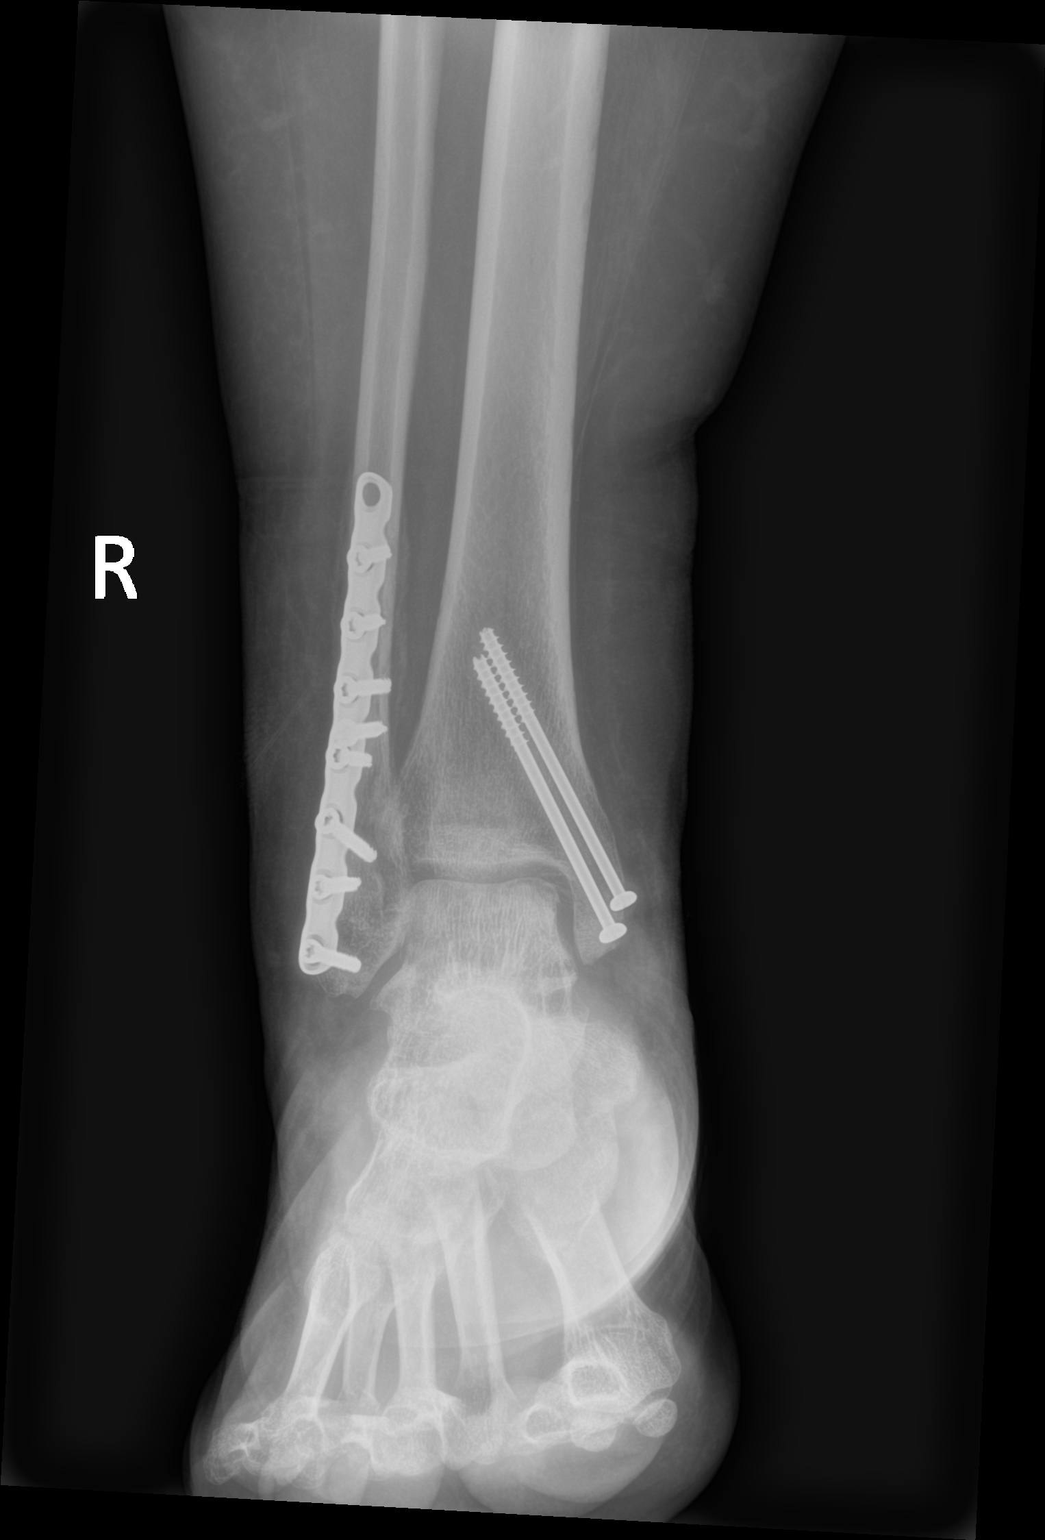

[ankle obl]
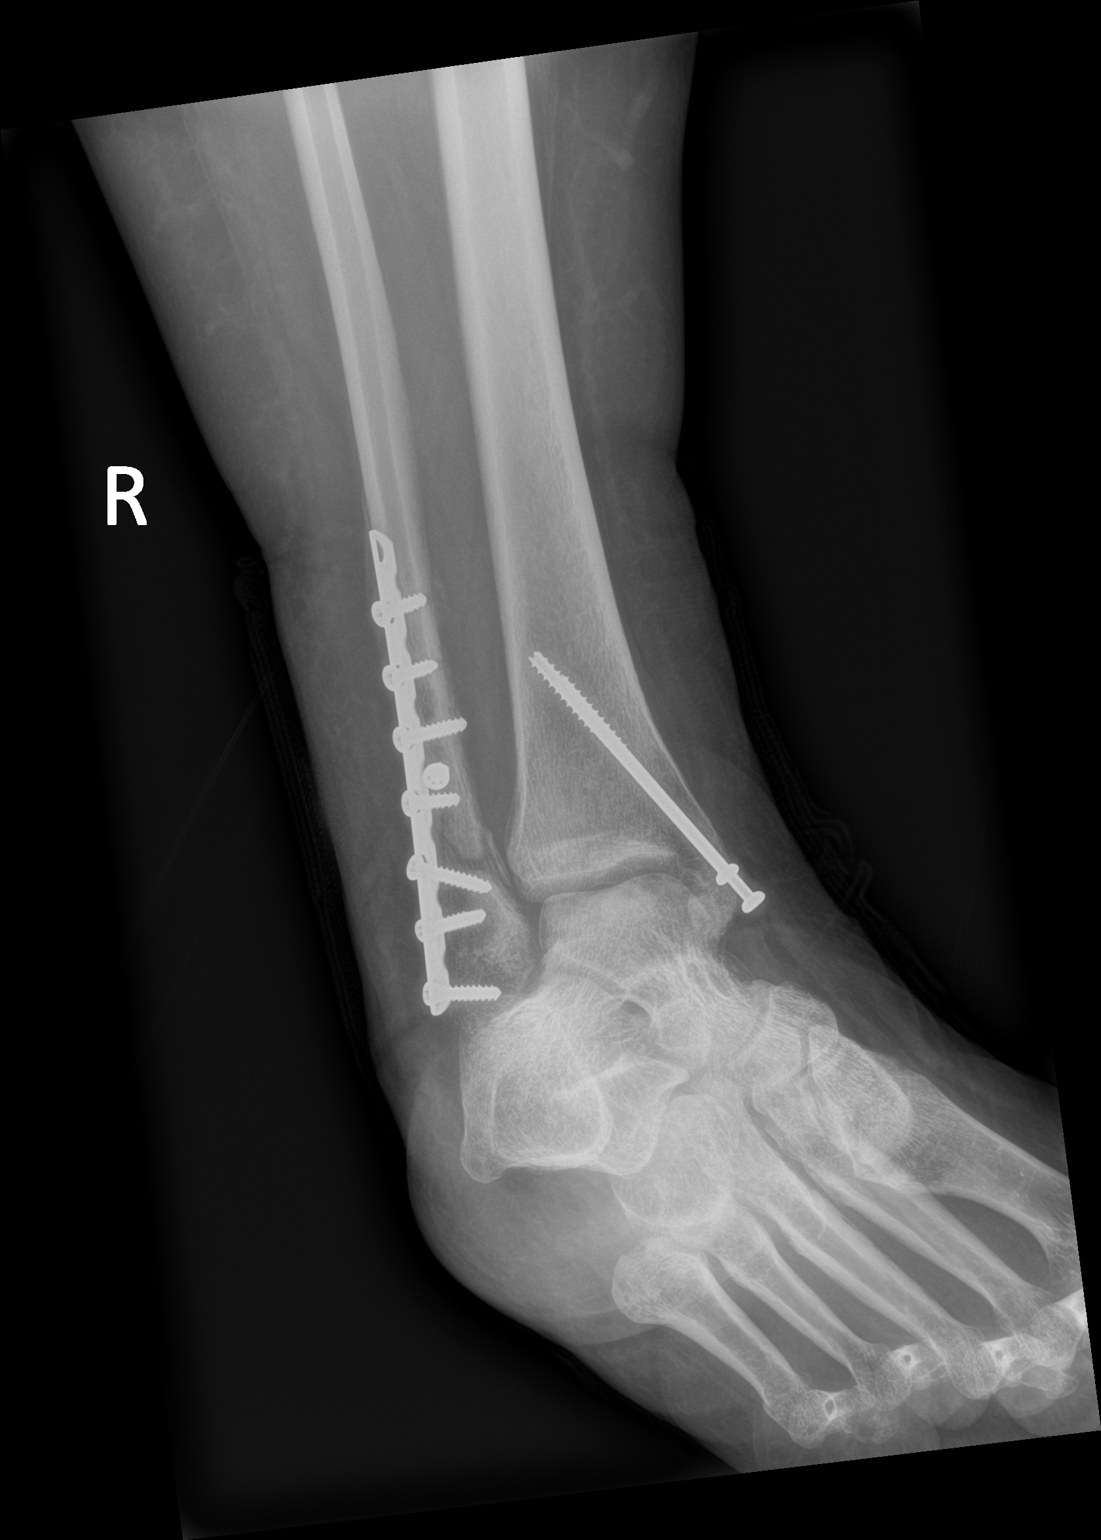

[ankle lat]
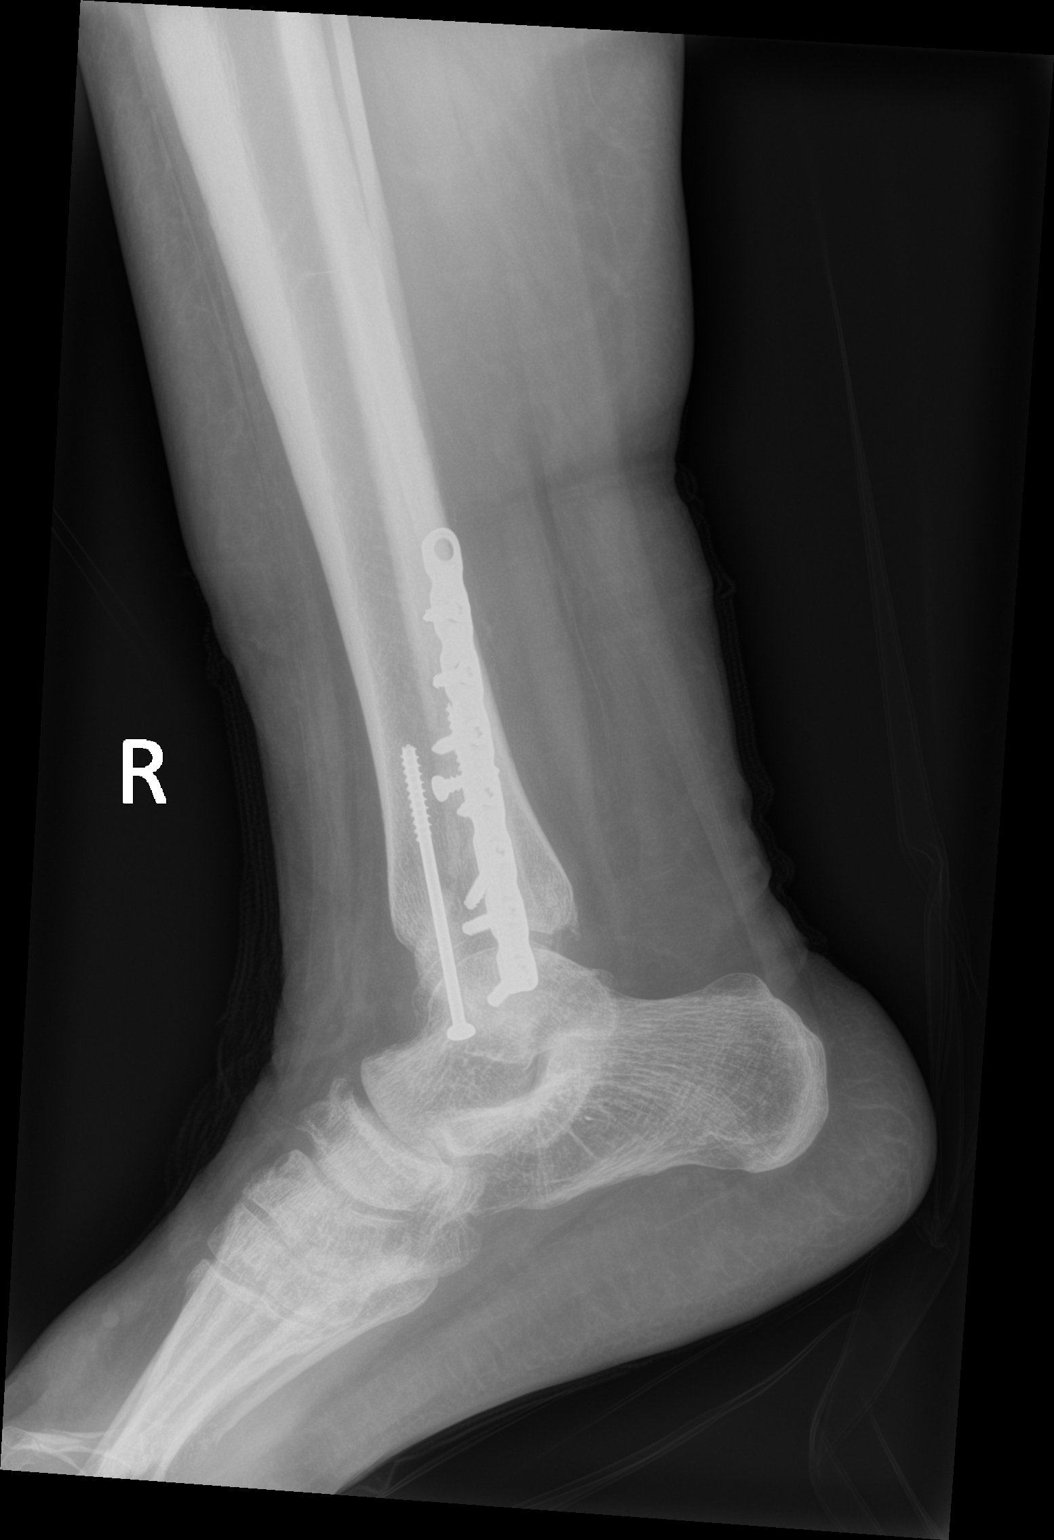

[3 of 3 positions shown; findings below may reference images not displayed]

FINDINGS: Metallic plate and multiple screws are transfixing fracture of
distal fibula. There are 2 surgical screws transfixing fracture of
medial malleolus. There is no change in alignment of the fracture
fragments. There are faint soft tissue calcifications adjacent to
the cortical margins suggesting callus formation. Degenerative
changes with bony spurs seen in the dorsal aspect of intertarsal
joints.
IMPRESSION: Previous internal fixation of fractures of distal right tibia and
fibula. There is no change in alignment of the fracture fragments.
There is callus formation in the adjacent soft tissues suggesting
healing fractures.

## 2022-09-06 ENCOUNTER — Ambulatory Visit (INDEPENDENT_AMBULATORY_CARE_PROVIDER_SITE_OTHER): Payer: Medicaid Other | Admitting: Advanced Practice Midwife

## 2022-09-06 ENCOUNTER — Encounter: Payer: Self-pay | Admitting: Advanced Practice Midwife

## 2022-09-06 ENCOUNTER — Other Ambulatory Visit (HOSPITAL_COMMUNITY)
Admission: RE | Admit: 2022-09-06 | Discharge: 2022-09-06 | Disposition: A | Discharge status: Home / Self Care | Payer: Medicaid Other | Source: Ambulatory Visit | Attending: Advanced Practice Midwife | Admitting: Advanced Practice Midwife

## 2022-09-06 VITALS — BP 130/80 | HR 85 | Wt 298.8 lb

## 2022-09-06 DIAGNOSIS — O99210 Obesity complicating pregnancy, unspecified trimester: Secondary | ICD-10-CM

## 2022-09-06 DIAGNOSIS — O0990 Supervision of high risk pregnancy, unspecified, unspecified trimester: Secondary | ICD-10-CM | POA: Insufficient documentation

## 2022-09-06 DIAGNOSIS — O219 Vomiting of pregnancy, unspecified: Secondary | ICD-10-CM

## 2022-09-06 DIAGNOSIS — Z3A16 16 weeks gestation of pregnancy: Secondary | ICD-10-CM

## 2022-09-06 DIAGNOSIS — O99213 Obesity complicating pregnancy, third trimester: Secondary | ICD-10-CM | POA: Diagnosis not present

## 2022-09-06 DIAGNOSIS — O23593 Infection of other part of genital tract in pregnancy, third trimester: Secondary | ICD-10-CM | POA: Diagnosis not present

## 2022-09-06 DIAGNOSIS — O0993 Supervision of high risk pregnancy, unspecified, third trimester: Secondary | ICD-10-CM | POA: Diagnosis not present

## 2022-09-06 DIAGNOSIS — O99012 Anemia complicating pregnancy, second trimester: Secondary | ICD-10-CM

## 2022-09-06 DIAGNOSIS — O24419 Gestational diabetes mellitus in pregnancy, unspecified control: Secondary | ICD-10-CM | POA: Diagnosis not present

## 2022-09-06 MED ORDER — ONDANSETRON 4 MG PO TBDP
4.0000 mg | ORAL_TABLET | Freq: Four times a day (QID) | ORAL | 0 refills | Status: DC | PRN
Start: 2022-09-06 — End: 2022-11-27

## 2022-09-06 MED ORDER — ASPIRIN 81 MG PO TBEC
81.0000 mg | DELAYED_RELEASE_TABLET | Freq: Every day | ORAL | 12 refills | Status: DC
Start: 2022-09-06 — End: 2023-02-22

## 2022-09-06 MED ORDER — FAMOTIDINE 20 MG PO TABS
20.0000 mg | ORAL_TABLET | Freq: Two times a day (BID) | ORAL | 2 refills | Status: DC
Start: 2022-09-06 — End: 2022-11-23

## 2022-09-06 MED ORDER — PROMETHAZINE HCL 12.5 MG PO TABS
12.5000 mg | ORAL_TABLET | Freq: Four times a day (QID) | ORAL | 0 refills | Status: DC | PRN
Start: 2022-09-06 — End: 2023-01-28

## 2022-09-06 NOTE — Progress Notes (Addendum)
Pt presents for NOB visit. Last PAP 2017. Pt c/o lower abdominal pain and pressure. Pt requesting Rx for nausea, Diclegis not working. No other concerns.

## 2022-09-06 NOTE — Progress Notes (Addendum)
Subjective:   Cynthia Horne is a 32 y.o. G3P1011 at [redacted]w[redacted]d by early ultrasound being seen today for her first obstetrical visit.  Her obstetrical history is significant for  prior vaginal delivery x 1  and has Obesity affecting pregnancy, antepartum; Migraine headache with aura; and Supervision of other normal pregnancy, antepartum on their problem list.. Patient does intend to breast feed. Pregnancy history fully reviewed.  Patient reports nausea and vomiting.  HISTORY: OB History  Gravida Para Term Preterm AB Living  3 1 1  0 1 1  SAB IAB Ectopic Multiple Live Births  1 0 0 0 1    # Outcome Date GA Lbr Len/2nd Weight Sex Delivery Anes PTL Lv  3 Current           2 Term 04/02/16 [redacted]w[redacted]d 16:15 / 01:13 8 lb 4.8 oz (3.765 kg) F Vag-Spont EPI  LIV     Name: Bobbye Riggs     Apgar1: 8  Apgar5: 9  1 SAB            Past Medical History:  Diagnosis Date   Anemia    Migraine    Ovarian cyst    Past Surgical History:  Procedure Laterality Date   OOPHORECTOMY Right 2005   ORIF ANKLE FRACTURE Right 12/31/2020   Procedure: OPEN REDUCTION INTERNAL FIXATION (ORIF) RIGHT ANKLE FRACTURE;  Surgeon: Huel Cote, MD;  Location: [facility] OR;  Service: Orthopedics;  Laterality: Right;   Family History  Problem Relation Age of Onset   Migraines Maternal Grandmother    Hypertension Maternal Grandmother    Cancer Maternal Grandfather        pancreatic   Social History   Tobacco Use   Smoking status: Former    Packs/day: .5    Types: Cigarettes, Cigars   Smokeless tobacco: Former   Tobacco comments:    quit with preg  Vaping Use   Vaping Use: Never used  Substance Use Topics   Alcohol use: No   Drug use: No   No Known Allergies Current Outpatient Medications on File Prior to Visit  Medication Sig Dispense Refill   cetirizine (ZYRTEC) 10 MG tablet Take 10 mg by mouth every other day.     Doxylamine-Pyridoxine (DICLEGIS PO) Take 10 mg by mouth at bedtime.     metoCLOPramide  (REGLAN) 10 MG tablet Take 1 tablet (10 mg total) by mouth every 6 (six) hours as needed for nausea. 30 tablet 0   scopolamine (TRANSDERM-SCOP) 1 MG/3DAYS Place 1 patch (1.5 mg total) onto the skin every 3 (three) days. 10 patch 12   No current facility-administered medications on file prior to visit.     Indications for ASA therapy (per uptodate) One of the following: Previous pregnancy with preeclampsia, especially early onset and with an adverse outcome No Multifetal gestation No Chronic hypertension No Type 1 or 2 diabetes mellitus No Chronic kidney disease No Autoimmune disease (antiphospholipid syndrome, systemic lupus erythematosus) No   Two or more of the following: Nulliparity No Obesity (body mass index >30 kg/m2) Yes Family history of preeclampsia in mother or sister No Age ?35 years No Sociodemographic characteristics (African American race, low socioeconomic level) Yes Personal risk factors (eg, previous pregnancy with low birth weight or small for gestational age infant, previous adverse pregnancy outcome [eg, stillbirth], interval >10 years between pregnancies) No   Indications for early 1 hour GTT (per uptodate)  BMI >25 (>23 in Asian women) AND one of the following  Gestational  diabetes mellitus in a previous pregnancy No Glycated hemoglobin ?5.7 percent (39 mmol/mol), impaired glucose tolerance, or impaired fasting glucose on previous testing No First-degree relative with diabetes No High-risk race/ethnicity (eg, African American, Latino, Native American, [place] American, Pacific Islander) Yes History of cardiovascular disease No Hypertension or on therapy for hypertension No High-density lipoprotein cholesterol level <35 mg/dL (8.11 mmol/L) and/or a triglyceride level >250 mg/dL (9.14 mmol/L) No Polycystic ovary syndrome No Physical inactivity No Other clinical condition associated with insulin resistance (eg, severe obesity, acanthosis nigricans) No Previous  birth of an infant weighing ?4000 g No Previous stillbirth of unknown cause No Exam   Vitals:   09/06/22 1309  BP: 130/80  Pulse: 85  Weight: 298 lb 12.8 oz (135.5 kg)   Fetal Heart Rate (bpm): 152  Uterus:     Pelvic Exam: Perineum: no hemorrhoids, normal perineum   Vulva: normal external genitalia, no lesions   Vagina:  normal mucosa, normal discharge   Cervix: no lesions and normal, pap smear done.    Adnexa: normal adnexa and no mass, fullness, tenderness   Bony Pelvis: average  System: General: well-developed, well-nourished female in no acute distress   Breast:  normal appearance, no masses or tenderness   Skin: normal coloration and turgor, no rashes   Neurologic: oriented, normal, negative, normal mood   Extremities: normal strength, tone, and muscle mass, ROM of all joints is normal   HEENT PERRLA, extraocular movement intact and sclera clear, anicteric   Mouth/Teeth mucous membranes moist, pharynx normal without lesions and dental hygiene good   Neck supple and no masses   Cardiovascular: regular rate and rhythm   Respiratory:  no respiratory distress, normal breath sounds   Abdomen: soft, non-tender; bowel sounds normal; no masses,  no organomegaly     Assessment:   Pregnancy: [id number] Patient Active Problem List   Diagnosis Date Noted   Supervision of other normal pregnancy, antepartum 08/31/2022   Migraine headache with aura 11/05/2015   Obesity affecting pregnancy, antepartum 10/08/2015     Plan:  1. Supervision of high risk pregnancy, antepartum --Anticipatory guidance about next visits/weeks of pregnancy given.   - Cytology - PAP( ) - CBC/D/Plt+RPR+Rh+ABO+RubIgG... - Panorama Prenatal Test Full Panel - HORIZON Basic Panel - AFP, Serum, Open Spina Bifida - HgB A1c - Culture, OB Urine - [place] MFM OB DETAIL +14 WK; Future  2. Nausea and vomiting during pregnancy prior to [redacted] weeks gestation --Diclegis not helping and pt has  drowsiness. --Reglan TID in the daytime with Phenergan Q HS PRN --Add Pepcid BID for indigestion --Zofran 4 mg Q 6 hours PRN for breakthrough nausea  - promethazine (PHENERGAN) 12.5 MG tablet; Take 1-2 tablets (12.5-25 mg total) by mouth every 6 (six) hours as needed for nausea or vomiting.  Dispense: 30 tablet; Refill: 0 - aspirin EC 81 MG tablet; Take 1 tablet (81 mg total) by mouth daily. Swallow whole.  Dispense: 30 tablet; Refill: 12 - ondansetron (ZOFRAN-ODT) 4 MG disintegrating tablet; Take 1 tablet (4 mg total) by mouth every 6 (six) hours as needed for nausea.  Dispense: 20 tablet; Refill: 0  3. Obesity affecting pregnancy, antepartum, unspecified obesity type --Baseline labs done, BASA daily  4. [redacted] weeks gestation of pregnancy     Initial labs drawn. Continue prenatal vitamins. Discussed and offered genetic screening options, including Quad screen/AFP, NIPS testing, and option to decline testing. Benefits/risks/alternatives reviewed. Pt aware that anatomy US is form of genetic screening with lower accuracy in detecting  trisomies than blood work.  Pt chooses genetic screening today. NIPS: ordered. Ultrasound discussed; fetal anatomic survey: ordered. Problem list reviewed and updated. The nature of [facility] with multiple MDs and other Advanced Practice Providers was explained to patient; also emphasized that residents, students are part of our team. Routine obstetric precautions reviewed. Return in about 4 weeks (around 10/04/2022).   Sharen Counter, CNM 09/06/22 4:53 PM

## 2022-09-07 LAB — CBC/D/PLT+RPR+RH+ABO+RUBIGG...
Antibody Screen: NEGATIVE
Basophils Absolute: 0 10*3/uL (ref 0.0–0.2)
Basos: 0 %
EOS (ABSOLUTE): 0 10*3/uL (ref 0.0–0.4)
Eos: 0 %
HCV Ab: NONREACTIVE
HIV Screen 4th Generation wRfx: NONREACTIVE
Hematocrit: 33.2 % — ABNORMAL LOW (ref 34.0–46.6)
Hemoglobin: 10.6 g/dL — ABNORMAL LOW (ref 11.1–15.9)
Hepatitis B Surface Ag: NEGATIVE
Immature Grans (Abs): 0 10*3/uL (ref 0.0–0.1)
Immature Granulocytes: 0 %
Lymphocytes Absolute: 1.8 10*3/uL (ref 0.7–3.1)
Lymphs: 16 %
MCH: 26.7 pg (ref 26.6–33.0)
MCHC: 31.9 g/dL (ref 31.5–35.7)
MCV: 84 fL (ref 79–97)
Monocytes Absolute: 0.7 10*3/uL (ref 0.1–0.9)
Monocytes: 6 %
Neutrophils Absolute: 8.7 10*3/uL — ABNORMAL HIGH (ref 1.4–7.0)
Neutrophils: 78 %
Platelets: 257 10*3/uL (ref 150–450)
RBC: 3.97 x10E6/uL (ref 3.77–5.28)
RDW: 18.1 % — ABNORMAL HIGH (ref 11.7–15.4)
RPR Ser Ql: NONREACTIVE
Rh Factor: POSITIVE
Rubella Antibodies, IGG: 2.72 index (ref >0.99)
WBC: 11.2 10*3/uL — ABNORMAL HIGH (ref 3.4–10.8)

## 2022-09-07 LAB — HEMOGLOBIN A1C
Est. average glucose Bld gHb Est-mCnc: 120 mg/dL
Hgb A1c MFr Bld: 5.8 % — ABNORMAL HIGH (ref 4.8–5.6)

## 2022-09-07 LAB — HCV INTERPRETATION

## 2022-09-07 MED ORDER — FERROUS SULFATE 324 (65 FE) MG PO TBEC
1.0000 | DELAYED_RELEASE_TABLET | ORAL | 2 refills | Status: AC
Start: 2022-09-07 — End: ?

## 2022-09-07 NOTE — Addendum Note (Signed)
Addended by: Sharen Counter A on: 09/07/2022 10:25 AM   Modules accepted: Orders

## 2022-09-08 LAB — CULTURE, OB URINE

## 2022-09-08 LAB — AFP, SERUM, OPEN SPINA BIFIDA
AFP MoM: 0.77
AFP Value: 19.5 ng/mL
Gest. Age on Collection Date: 16.1 weeks
Maternal Age At EDD: 29.8 yr
OSBR Risk 1 IN: 10000
Test Results:: NEGATIVE
Weight: 298 [lb_av]

## 2022-09-08 LAB — URINE CULTURE, OB REFLEX: Organism ID, Bacteria: NO GROWTH

## 2022-09-09 LAB — CYTOLOGY - PAP
Comment: NEGATIVE
Diagnosis: UNDETERMINED — AB
High risk HPV: NEGATIVE

## 2022-09-10 ENCOUNTER — Other Ambulatory Visit: Payer: Medicaid Other

## 2022-09-13 ENCOUNTER — Encounter (HOSPITAL_BASED_OUTPATIENT_CLINIC_OR_DEPARTMENT_OTHER): Payer: Self-pay | Admitting: Advanced Practice Midwife

## 2022-09-13 DIAGNOSIS — R87610 Atypical squamous cells of undetermined significance on cytologic smear of cervix (ASC-US): Secondary | ICD-10-CM | POA: Insufficient documentation

## 2022-09-14 LAB — PANORAMA PRENATAL TEST FULL PANEL:PANORAMA TEST PLUS 5 ADDITIONAL MICRODELETIONS: FETAL FRACTION: 8.5

## 2022-09-15 LAB — HORIZON CUSTOM: REPORT SUMMARY: NEGATIVE

## 2022-09-17 ENCOUNTER — Other Ambulatory Visit: Payer: Medicaid Other

## 2022-09-17 DIAGNOSIS — O0990 Supervision of high risk pregnancy, unspecified, unspecified trimester: Secondary | ICD-10-CM

## 2022-09-18 LAB — GLUCOSE TOLERANCE, 2 HOURS W/ 1HR
Glucose, 1 hour: 106 mg/dL (ref 70–179)
Glucose, 2 hour: 89 mg/dL (ref 70–152)
Glucose, Fasting: 89 mg/dL (ref 70–91)

## 2022-09-28 ENCOUNTER — Ambulatory Visit (INDEPENDENT_AMBULATORY_CARE_PROVIDER_SITE_OTHER): Payer: BLUE CROSS/BLUE SHIELD | Admitting: Licensed Clinical Social Worker

## 2022-09-28 DIAGNOSIS — F4321 Adjustment disorder with depressed mood: Secondary | ICD-10-CM

## 2022-09-30 NOTE — BH Specialist Note (Signed)
Integrated Behavioral Health Initial In-Person Visit  MRN: [id number] Name: Cynthia Horne  Number of Integrated Behavioral Health Clinician visits: 1 Session Start time:   11:00am Session End time: 12:02pm Total time in minutes: 61 mins in person at [facility]   Types of Service: Individual psychotherapy  Interpretor:No. Interpretor Name and Language: none   Warm Hand Off Completed.        Subjective: Cynthia Horne is a 32 y.o. female accompanied by n/a Patient was referred by L. Leftwich Kirby for depressed mood. Patient reports the following symptoms/concerns: situational stress Duration of problem: approx 6 months; Severity of problem: mild  Objective: Mood: Depressed and Affect: Appropriate Risk of harm to self or others: No plan to harm self or others  Life Context: Family and Social: lives in [place] with grandmother School/Work: n/a Self-Care: n/a Life Changes: new pregnancy  Patient and/or Family's Strengths/Protective Factors: Concrete supports in place (healthy food, safe environments, etc.)  Goals Addressed: Patient will: Reduce symptoms of: depression and stress Increase knowledge and/or ability of: coping skills  Demonstrate ability to: Increase healthy adjustment to current life circumstances  Progress towards Goals: Ongoing  Interventions: Interventions utilized: Supportive Counseling  Standardized Assessments completed: PHQ 9  Patient and/or Family Response: Cynthia Horne reports increased stress due to moving and family conflict. Cynthia Horne reports depressed mood, overthinking, and stress.  Assessment: Patient currently experiencing adjustment disorder with depressed mood.   Patient may benefit from integrated behavioral health.  Plan: Follow up with behavioral health clinician on : 10/19/2022 Behavioral recommendations: communicate need for added support, create boundaries and engage in stress reducing activity.  Referral(s): Integrated  [employer] (In Clinic) "From scale of 1-10, how likely are you to follow plan?":    Gwyndolyn Saxon, LCSW

## 2022-10-01 IMAGING — CT CT HEAD W/O CM
4 series · 17 of 47 positions shown, 19 images · non-contrast
Comparison: October 02, 2018

CLINICAL DATA: Headache.

EXAM:
CT HEAD WITHOUT CONTRAST
TECHNIQUE: Contiguous axial images were obtained from the base of the skull
through the vertex without intravenous contrast.

[Series 2: head wo · axial · 0.47mm/px · z∈[-133,-13]mm · 7 of 33 slices shown, 9 images]
[im 5/33  brain]
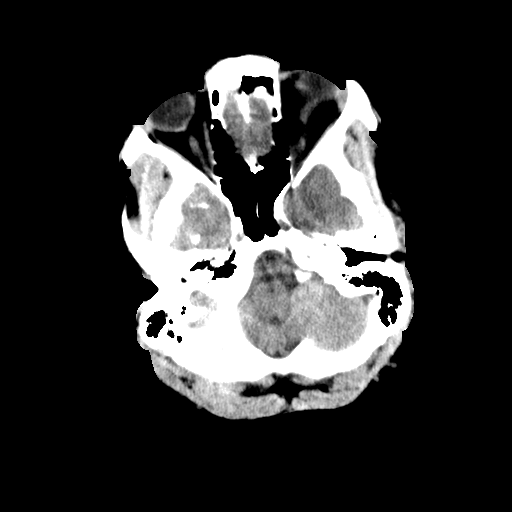
[im 5/33  bone]
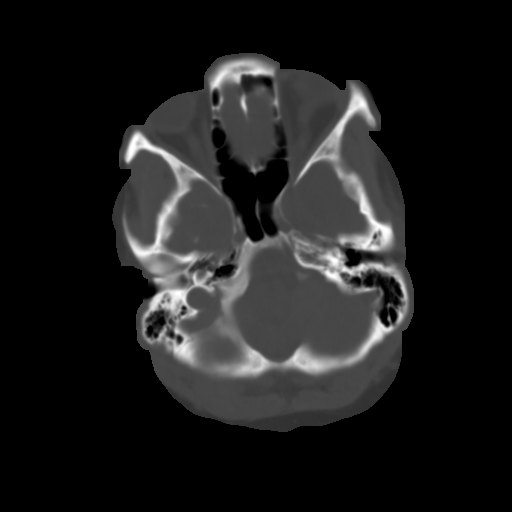
[im 9/33  brain]
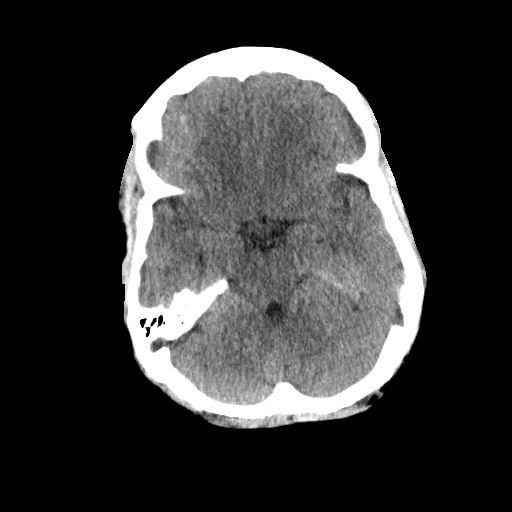
[im 13/33  brain]
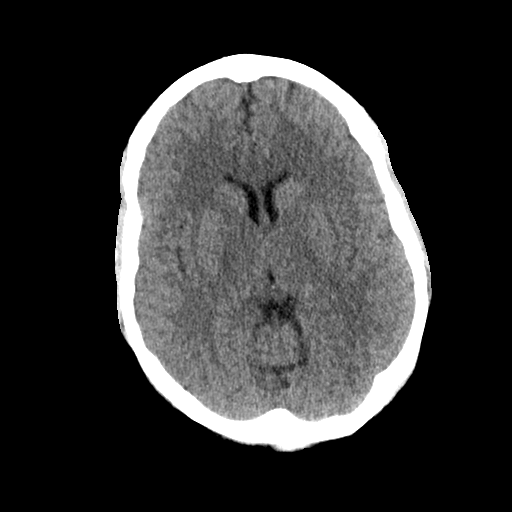
[im 17/33  brain]
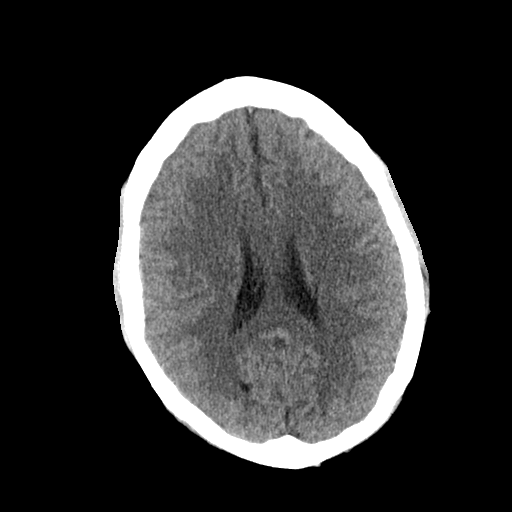
[im 21/33  brain]
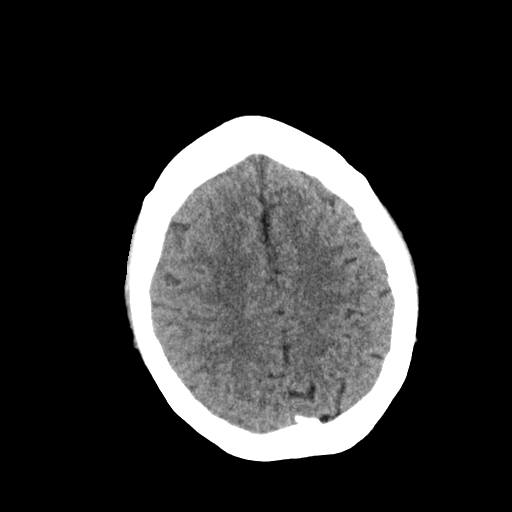
[im 21/33  bone]
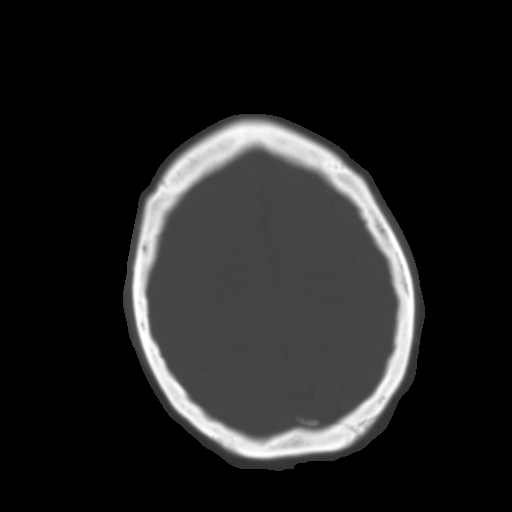
[im 25/33  brain]
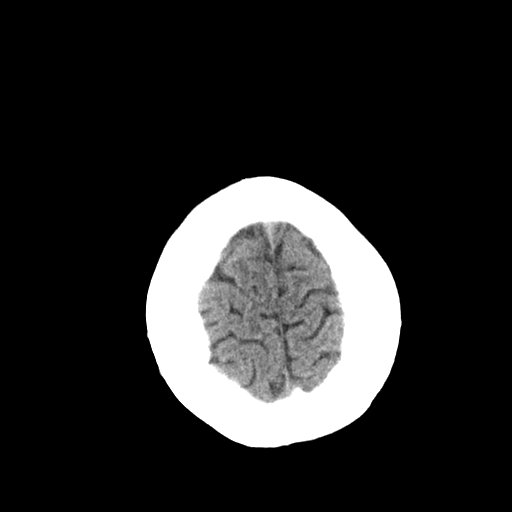
[im 29/33  brain]
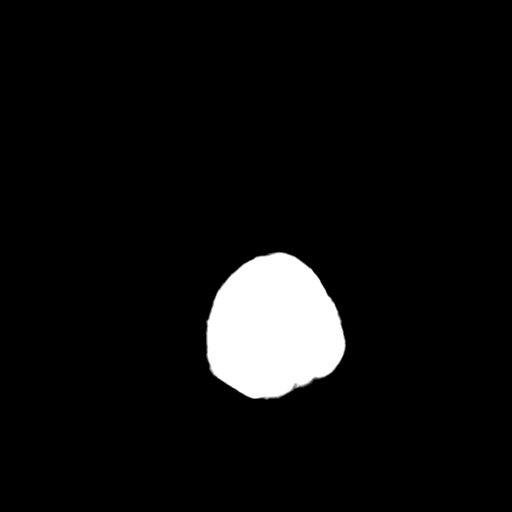

[Series 3: head bone · axial · 0.47mm/px · z∈[-137,-81]mm · 4 of 82 slices shown]
[im 9/82  bone]
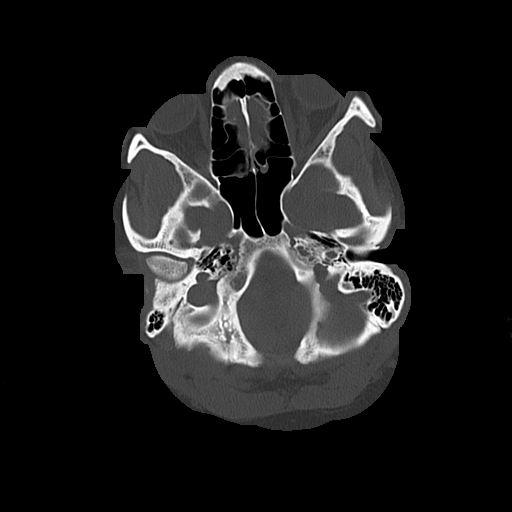
[im 17/82  bone]
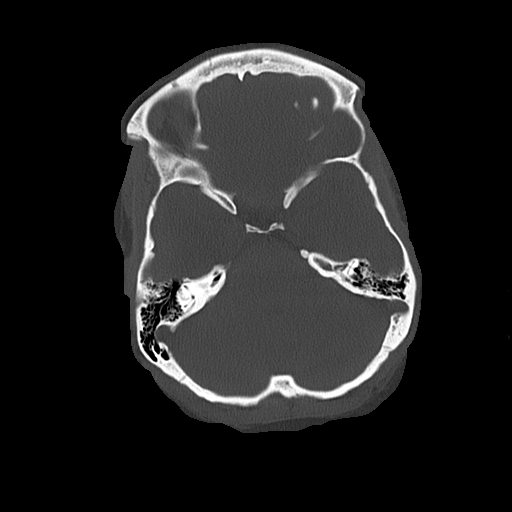
[im 25/82  bone]
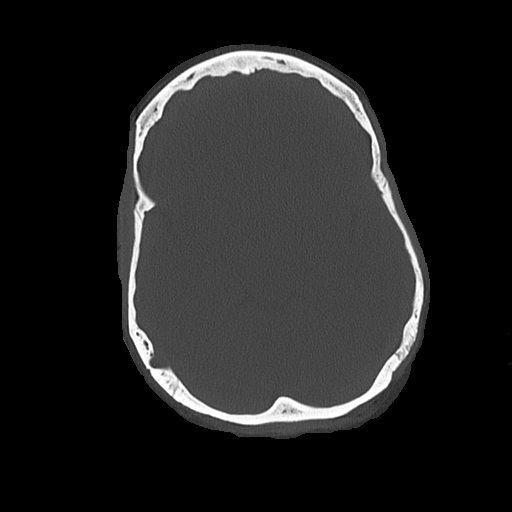
[im 37/82  bone]
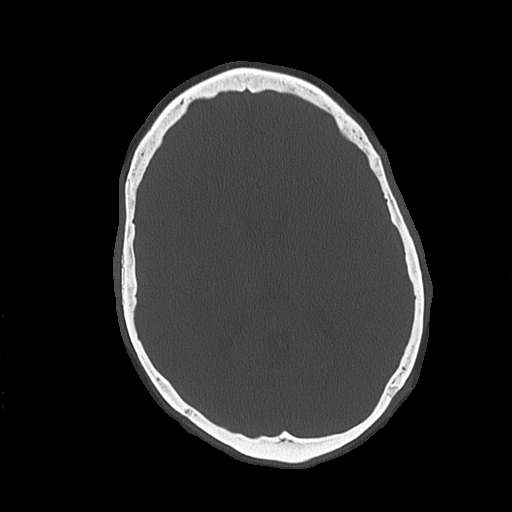

[Series 4: coronal soft · coronal · 0.33mm/px · 3 of 70 slices shown]
[im 24/70  brain]
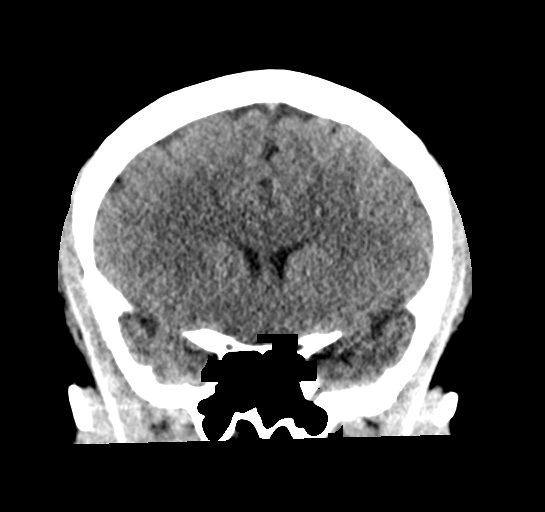
[im 31/70  brain]
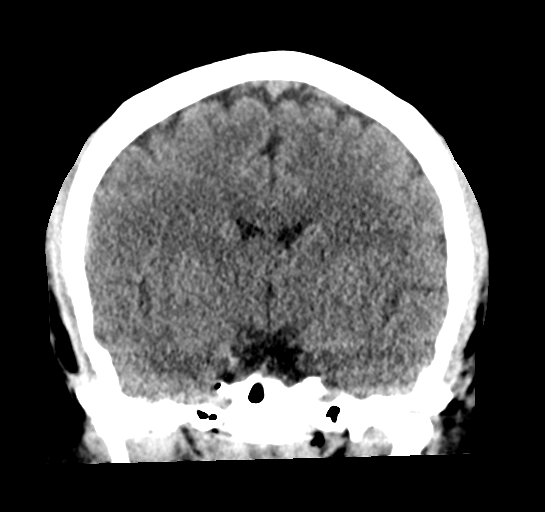
[im 39/70  brain]
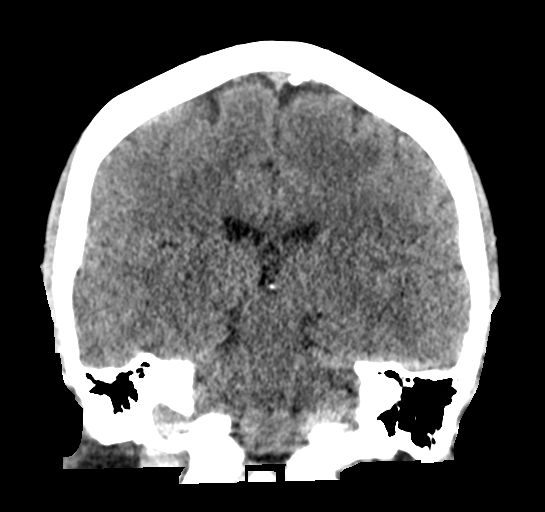

[Series 5: sagittal soft · sagittal · 0.36mm/px · 3 of 56 slices shown]
[im 19/56  brain]
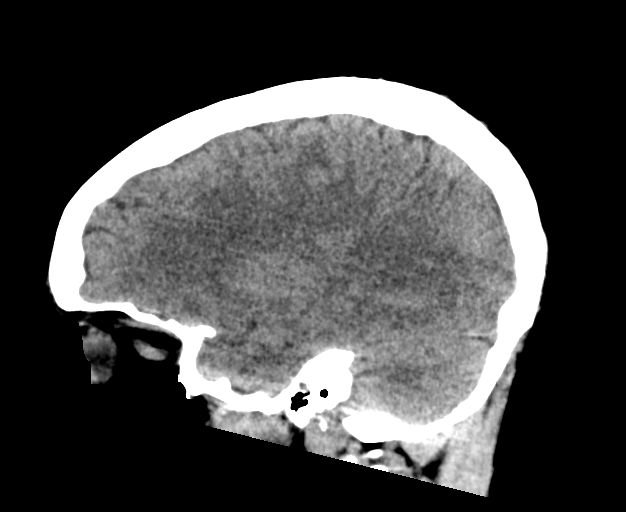
[im 28/56  brain]
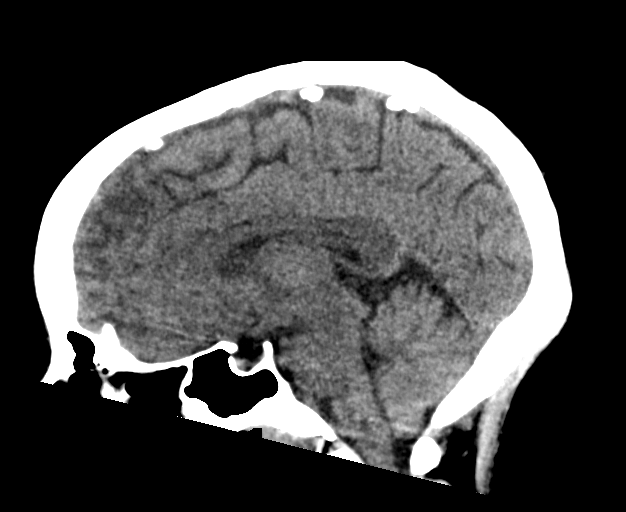
[im 37/56  brain]
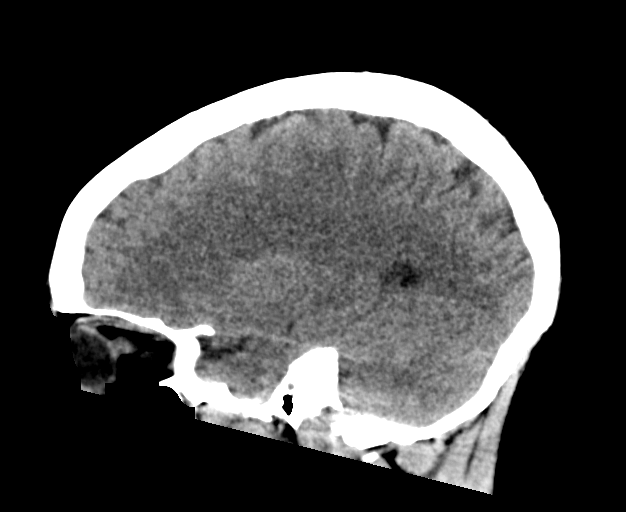

[17 of 47 positions shown; findings below may reference images not displayed]

FINDINGS: Brain: No evidence of acute infarction, hemorrhage, hydrocephalus,
extra-axial collection or mass lesion/mass effect.

Vascular: No hyperdense vessel or unexpected calcification.

Skull: Normal. Negative for fracture or focal lesion.

Sinuses/Orbits: No acute finding.

Other: None.
IMPRESSION: No acute intracranial abnormality.

## 2022-10-04 ENCOUNTER — Encounter: Payer: Self-pay | Admitting: Obstetrics

## 2022-10-04 ENCOUNTER — Ambulatory Visit (INDEPENDENT_AMBULATORY_CARE_PROVIDER_SITE_OTHER): Payer: Medicaid Other | Admitting: Obstetrics

## 2022-10-04 VITALS — BP 116/70 | HR 104 | Wt 304.0 lb

## 2022-10-04 DIAGNOSIS — O99210 Obesity complicating pregnancy, unspecified trimester: Secondary | ICD-10-CM

## 2022-10-04 DIAGNOSIS — Z3480 Encounter for supervision of other normal pregnancy, unspecified trimester: Secondary | ICD-10-CM

## 2022-10-04 DIAGNOSIS — F4321 Adjustment disorder with depressed mood: Secondary | ICD-10-CM

## 2022-10-04 NOTE — Progress Notes (Signed)
Subjective:  Cynthia Horne is a 33 y.o. G3P1011 at [redacted]w[redacted]d being seen today for ongoing prenatal care.  She is currently monitored for the following issues for this low-risk pregnancy and has Obesity affecting pregnancy, antepartum; Migraine headache with aura; Supervision of high-risk pregnancy; and ASCUS of cervix with negative high risk HPV on their problem list.  Patient reports heartburn.  Contractions: Not present. Vag. Bleeding: None.  Movement: Present. Denies leaking of fluid.   The following portions of the patient's history were reviewed and updated as appropriate: allergies, current medications, past family history, past medical history, past social history, past surgical history and problem list. Problem list updated.  Objective:   Vitals:   10/04/22 0957  BP: 116/70  Pulse: (!) 104  Weight: (!) 304 lb (137.9 kg)    Fetal Status: Fetal Heart Rate (bpm): 154   Movement: Present     General:  Alert, oriented and cooperative. Patient is in no acute distress.  Skin: Skin is warm and dry. No rash noted.   Cardiovascular: Normal heart rate noted  Respiratory: Normal respiratory effort, no problems with respiration noted  Abdomen: Soft, gravid, appropriate for gestational age. Pain/Pressure: Present     Pelvic:  Cervical exam deferred        Extremities: Normal range of motion.  Edema: Trace  Mental Status: Normal mood and affect. Normal behavior. Normal judgment and thought content.   Urinalysis:      Assessment and Plan:  Pregnancy: G3P1011 at [redacted]w[redacted]d  1. Supervision of other normal pregnancy, antepartum  2. Obesity affecting pregnancy, antepartum, unspecified obesity type - taking Baby ASA  3. Adjustment disorder with depressed mood - clinically stable.  No meds.  Preterm labor symptoms and general obstetric precautions including but not limited to vaginal bleeding, contractions, leaking of fluid and fetal movement were reviewed in detail with the patient. Please refer  to After Visit Summary for other counseling recommendations.   Return in about 4 weeks (around 11/01/2022) for ROB.   Brock Bad, MD 10/04/22   . t

## 2022-10-04 NOTE — Progress Notes (Signed)
ROB, c/o back pain 8/10, HA 8/10 x 2+ weeks.

## 2022-10-08 ENCOUNTER — Encounter: Payer: Self-pay | Admitting: *Deleted

## 2022-10-12 ENCOUNTER — Other Ambulatory Visit: Payer: Self-pay | Admitting: *Deleted

## 2022-10-12 ENCOUNTER — Encounter: Payer: Self-pay | Admitting: *Deleted

## 2022-10-12 ENCOUNTER — Ambulatory Visit: Payer: BLUE CROSS/BLUE SHIELD | Admitting: *Deleted

## 2022-10-12 ENCOUNTER — Ambulatory Visit: Payer: BLUE CROSS/BLUE SHIELD | Attending: Advanced Practice Midwife

## 2022-10-12 DIAGNOSIS — Z3A21 21 weeks gestation of pregnancy: Secondary | ICD-10-CM | POA: Diagnosis not present

## 2022-10-12 DIAGNOSIS — E669 Obesity, unspecified: Secondary | ICD-10-CM

## 2022-10-12 DIAGNOSIS — O99212 Obesity complicating pregnancy, second trimester: Secondary | ICD-10-CM

## 2022-10-12 DIAGNOSIS — O0990 Supervision of high risk pregnancy, unspecified, unspecified trimester: Secondary | ICD-10-CM | POA: Diagnosis present

## 2022-10-12 DIAGNOSIS — Z362 Encounter for other antenatal screening follow-up: Secondary | ICD-10-CM

## 2022-10-19 ENCOUNTER — Ambulatory Visit: Payer: Medicaid Other | Admitting: Licensed Clinical Social Worker

## 2022-10-25 ENCOUNTER — Ambulatory Visit (INDEPENDENT_AMBULATORY_CARE_PROVIDER_SITE_OTHER): Payer: BLUE CROSS/BLUE SHIELD | Admitting: Student

## 2022-10-25 VITALS — BP 129/81 | HR 113 | Wt 301.4 lb

## 2022-10-25 DIAGNOSIS — Z3480 Encounter for supervision of other normal pregnancy, unspecified trimester: Secondary | ICD-10-CM

## 2022-10-25 DIAGNOSIS — O99210 Obesity complicating pregnancy, unspecified trimester: Secondary | ICD-10-CM

## 2022-10-25 DIAGNOSIS — R519 Headache, unspecified: Secondary | ICD-10-CM

## 2022-10-25 DIAGNOSIS — Z3A23 23 weeks gestation of pregnancy: Secondary | ICD-10-CM

## 2022-10-25 NOTE — Progress Notes (Signed)
   PRENATAL VISIT NOTE  Subjective:  Cynthia Horne is a 32 y.o. G3P1011 at [redacted]w[redacted]d being seen today for ongoing prenatal care.  She is currently monitored for the following issues for this high-risk pregnancy and has Obesity affecting pregnancy, antepartum; Supervision of high-risk pregnancy; and ASCUS of cervix with negative high risk HPV on their problem list.  Patient reports headache. States headaches have been going on for a couple of weeks now. Has visual disturbance prior and during headaches. Taking an average of 2.5g Tylenol daily. Has temporary relief, but onset is daily. Reports drinking appropriate amounts of water throughout the day. Also has noticed increased swelling in lower extremities. 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 Reports one occasion of facial swelling within past week.   Contractions: Not present. Vag. Bleeding: None.  Movement: Present. Denies leaking of fluid.   The following portions of the patient's history were reviewed and updated as appropriate: allergies, current medications, past family history, past medical history, past social history, past surgical history and problem list.   Objective:   Vitals:   10/25/22 1626  BP: 129/81  Pulse: (!) 113  Weight: (!) 301 lb 6.4 oz (136.7 kg)    Fetal Status: Fetal Heart Rate (bpm): 143   Movement: Present     General:  Alert, oriented and cooperative. Patient is in no acute distress.  Skin: Skin is warm and dry. No rash noted.   Cardiovascular: Normal heart rate noted  Respiratory: Normal respiratory effort, no problems with respiration noted  Abdomen: Soft, gravid, appropriate for gestational age.  Pain/Pressure: Present     Pelvic: Cervical exam deferred        Extremities: Normal range of motion.  Edema: Mild pitting, slight indentation  Mental Status: Normal mood and affect. Normal behavior. Normal judgment and thought content.   Assessment  and Plan:  Pregnancy: G3P1011 at [redacted]w[redacted]d 1. Supervision of other normal pregnancy, antepartum - Frequent fetal movement - Discussed importance of balancing rest and activity to support circulation. Encouraged continued hydration and elevation of extremities to minimize swelling  2. [redacted] weeks gestation of pregnancy - gtt at next visit  3. Obesity affecting pregnancy, antepartum, unspecified obesity type - BMI 50 - ASA therapy  - follow-up ultrasound scheduled   4. Frequent headaches - Normal BP - Discussed importance of continued hydration. Reassurance provided in setting of normal blood pressures, but will collect baseline labs today for comparison later in pregnancy as needed - Patient to continue tylenol and plan to reevaluate pharmacological therapy at next visit - Protein / creatinine ratio, urine - CBC - Comprehensive metabolic panel   Preterm labor symptoms and general obstetric precautions including but not limited to vaginal bleeding, contractions, leaking of fluid and fetal movement were reviewed in detail with the patient. Please refer to After Visit Summary for other counseling recommendations.   Return in about 4 weeks (around 11/22/2022) for IN-PERSON, LOB/GTT.  Future Appointments  Date Time Provider Department Center  11/10/2022 11:15 AM [facility] NURSE [facility]  11/10/2022 11:30 AM [facility] US2 [facility]  11/23/2022  8:30 AM [facility] LAB [facility] None  11/23/2022  9:35 AM Lennart Pall, MD [facility] None  12/07/2022 10:55 AM Adam Phenix, MD [facility] None  12/21/2022  9:55 AM Lennart Pall, MD [facility] None  12/22/2022  9:00 AM Ocie Doyne, MD [facility] None    Corlis Hove, NP

## 2022-10-25 NOTE — Progress Notes (Signed)
Pt presents for ROB visit. Pt c/o swelling in the legs and feet and headaches daily.

## 2022-10-26 LAB — PROTEIN / CREATININE RATIO, URINE
Creatinine, Urine: 141 mg/dL
Protein, Ur: 34.7 mg/dL
Protein/Creat Ratio: 246 mg/g creat — ABNORMAL HIGH (ref 0–200)

## 2022-10-26 LAB — COMPREHENSIVE METABOLIC PANEL
ALT: 6 IU/L (ref 0–32)
AST: 12 IU/L (ref 0–40)
Albumin: 3.6 g/dL — ABNORMAL LOW (ref 4.0–5.0)
Alkaline Phosphatase: 103 IU/L (ref 44–121)
BUN/Creatinine Ratio: 10 (ref 9–23)
BUN: 5 mg/dL — ABNORMAL LOW (ref 6–20)
Bilirubin Total: 0.2 mg/dL (ref 0.0–1.2)
CO2: 21 mmol/L (ref 20–29)
Calcium: 11.1 mg/dL — ABNORMAL HIGH (ref 8.7–10.2)
Chloride: 104 mmol/L (ref 96–106)
Creatinine, Ser: 0.51 mg/dL — ABNORMAL LOW (ref 0.57–1.00)
Globulin, Total: 3 g/dL (ref 1.5–4.5)
Glucose: 108 mg/dL — ABNORMAL HIGH (ref 70–99)
Potassium: 3.8 mmol/L (ref 3.5–5.2)
Sodium: 136 mmol/L (ref 134–144)
Total Protein: 6.6 g/dL (ref 6.0–8.5)
eGFR: 130 mL/min/{1.73_m2} (ref >59)

## 2022-10-26 LAB — CBC
Hematocrit: 28.6 % — ABNORMAL LOW (ref 34.0–46.6)
Hemoglobin: 9.4 g/dL — ABNORMAL LOW (ref 11.1–15.9)
MCH: 26.3 pg — ABNORMAL LOW (ref 26.6–33.0)
MCHC: 32.9 g/dL (ref 31.5–35.7)
MCV: 80 fL (ref 79–97)
Platelets: 250 10*3/uL (ref 150–450)
RBC: 3.58 x10E6/uL — ABNORMAL LOW (ref 3.77–5.28)
RDW: 15 % (ref 11.7–15.4)
WBC: 12.6 10*3/uL — ABNORMAL HIGH (ref 3.4–10.8)

## 2022-11-01 ENCOUNTER — Encounter: Payer: BLUE CROSS/BLUE SHIELD | Admitting: Advanced Practice Midwife

## 2022-11-10 ENCOUNTER — Ambulatory Visit: Payer: BLUE CROSS/BLUE SHIELD

## 2022-11-10 ENCOUNTER — Telehealth: Payer: Self-pay

## 2022-11-10 NOTE — Telephone Encounter (Signed)
-----   Message from Corlis Hove sent at 11/05/2022  8:56 PM EDT ----- Can you confirm with patient that she was aware of PO iron that had been ordered before, if not I recommend she start immediately? If she is than I would recommend an IV iron infusion be scheduled for her.

## 2022-11-10 NOTE — Telephone Encounter (Signed)
Spoke with patient and patient has not started po iron, will pick rx up and start iron today.

## 2022-11-10 NOTE — Telephone Encounter (Signed)
Error

## 2022-11-15 ENCOUNTER — Encounter: Payer: Self-pay | Admitting: *Deleted

## 2022-11-15 ENCOUNTER — Ambulatory Visit: Payer: BLUE CROSS/BLUE SHIELD | Admitting: *Deleted

## 2022-11-15 ENCOUNTER — Other Ambulatory Visit: Payer: Self-pay | Admitting: *Deleted

## 2022-11-15 ENCOUNTER — Ambulatory Visit: Payer: BLUE CROSS/BLUE SHIELD | Attending: Obstetrics

## 2022-11-15 DIAGNOSIS — Z362 Encounter for other antenatal screening follow-up: Secondary | ICD-10-CM | POA: Insufficient documentation

## 2022-11-15 DIAGNOSIS — E669 Obesity, unspecified: Secondary | ICD-10-CM | POA: Diagnosis not present

## 2022-11-15 DIAGNOSIS — O99212 Obesity complicating pregnancy, second trimester: Secondary | ICD-10-CM | POA: Diagnosis not present

## 2022-11-15 DIAGNOSIS — Z3A26 26 weeks gestation of pregnancy: Secondary | ICD-10-CM

## 2022-11-15 DIAGNOSIS — O99213 Obesity complicating pregnancy, third trimester: Secondary | ICD-10-CM

## 2022-11-23 ENCOUNTER — Other Ambulatory Visit (HOSPITAL_COMMUNITY)
Admission: RE | Admit: 2022-11-23 | Discharge: 2022-11-23 | Disposition: A | Discharge status: Home / Self Care | Payer: BLUE CROSS/BLUE SHIELD | Source: Ambulatory Visit | Attending: Obstetrics and Gynecology | Admitting: Obstetrics and Gynecology

## 2022-11-23 ENCOUNTER — Ambulatory Visit (INDEPENDENT_AMBULATORY_CARE_PROVIDER_SITE_OTHER): Payer: BLUE CROSS/BLUE SHIELD | Admitting: Obstetrics and Gynecology

## 2022-11-23 ENCOUNTER — Other Ambulatory Visit: Payer: BLUE CROSS/BLUE SHIELD

## 2022-11-23 VITALS — BP 133/83 | HR 114 | Wt 309.0 lb

## 2022-11-23 DIAGNOSIS — Z6841 Body Mass Index (BMI) 40.0 and over, adult: Secondary | ICD-10-CM

## 2022-11-23 DIAGNOSIS — Z3480 Encounter for supervision of other normal pregnancy, unspecified trimester: Secondary | ICD-10-CM | POA: Insufficient documentation

## 2022-11-23 DIAGNOSIS — Z1339 Encounter for screening examination for other mental health and behavioral disorders: Secondary | ICD-10-CM | POA: Diagnosis not present

## 2022-11-23 DIAGNOSIS — G8929 Other chronic pain: Secondary | ICD-10-CM

## 2022-11-23 DIAGNOSIS — R519 Headache, unspecified: Secondary | ICD-10-CM

## 2022-11-23 DIAGNOSIS — O219 Vomiting of pregnancy, unspecified: Secondary | ICD-10-CM

## 2022-11-23 DIAGNOSIS — O0992 Supervision of high risk pregnancy, unspecified, second trimester: Secondary | ICD-10-CM

## 2022-11-23 DIAGNOSIS — Z3A27 27 weeks gestation of pregnancy: Secondary | ICD-10-CM

## 2022-11-23 HISTORY — DX: Headache, unspecified: R51.9

## 2022-11-23 HISTORY — DX: Other chronic pain: G89.29

## 2022-11-23 MED ORDER — METOCLOPRAMIDE HCL 10 MG PO TABS
10.0000 mg | ORAL_TABLET | Freq: Four times a day (QID) | ORAL | 0 refills | Status: DC | PRN
Start: 2022-11-23 — End: 2022-11-28

## 2022-11-23 MED ORDER — OMEPRAZOLE MAGNESIUM 20 MG PO TBEC: 20.0000 mg | DELAYED_RELEASE_TABLET | Freq: Every day | ORAL | 1 refills | Status: DC

## 2022-11-23 NOTE — Patient Instructions (Signed)
Taking 2 extra strength tylenol, reglan and benadryl when you get home to see if it helps your headache.

## 2022-11-23 NOTE — Progress Notes (Addendum)
ROB/GTT.  C/o headaches 8/10 every day.

## 2022-11-23 NOTE — Progress Notes (Signed)
PRENATAL VISIT NOTE  Subjective:  Cynthia Horne is a 32 y.o. G3P1011 at [redacted]w[redacted]d being seen today for ongoing prenatal care.  She is currently monitored for the following issues for this high-risk pregnancy and has Obesity affecting pregnancy, antepartum; Supervision of high-risk pregnancy; and ASCUS of cervix with negative high risk HPV on their problem list.  Patient reports headache. Occurring daily and have a different quality than her usual migraines - state this feels more like pressure/tension related. Usually worst first thing in the morning, associated with occasional visual changes in the AM that resolve spontaneously. Feels like her current HA is triggered by fasting. Is taking tylenol without relief. Has seen neuro in the past but reports that they try to give her anti seizure medicine and anti depressants that she doesn't want to take. No current visual changes, RUQ/epigastric pain. She also reports nausea and indigestion this week.  Contractions: Irritability. Vag. Bleeding: None.  Movement: Present. Denies leaking of fluid.   The following portions of the patient's history were reviewed and updated as appropriate: allergies, current medications, past family history, past medical history, past social history, past surgical history and problem list.   Objective:   Vitals:   11/23/22 0911  BP: 133/83  Pulse: (!) 114  Weight: (!) 309 lb (140.2 kg)   Fetal Status: Fetal Heart Rate (bpm): 144   Movement: Present     General:  Alert, oriented and cooperative. Patient is in no acute distress.  Skin: Skin is warm and dry. No rash noted.   Cardiovascular: Normal heart rate noted  Respiratory: Normal respiratory effort, no problems with respiration noted  Abdomen: Soft, gravid, appropriate for gestational age.  Pain/Pressure: Present      Assessment and Plan:  Pregnancy: G3P1011 at [redacted]w[redacted]d 1. Supervision of high risk pregnancy in second trimester 2. [redacted] weeks gestation of  pregnancy Reviewed MAU precautions - Glucose Tolerance, 2 Hours w/1 Hour - CBC - HIV Antibody (routine testing w rflx) - RPR - Cervicovaginal ancillary only( [place]) -- requested STI testing  3. Nonintractable episodic headache, unspecified headache type Normotensive. Discussed magnesium for HA prevention, OTC topical medications (e.g., Tiger balm) on the neck/back to help with tension Normal CTA/V head 03/2021, was planned for MRI in Feb but did not complete Low suspicion for preeclampsia at this time Pt will do HA cocktail w/ tylenol, reglan & benadryl when she gets home Will refer back to neurology for further workup/imaging recommendations. We discussed rationale behind some of neurology's prior recommendations - Ambulatory referral to Neurology  4. Nausea and vomiting of pregnancy, antepartum Rx also sent for prilosec for indigestion - metoCLOPramide (REGLAN) 10 MG tablet; Take 1 tablet (10 mg total) by mouth every 6 (six) hours as needed for nausea.  Dispense: 30 tablet; Refill: 0  5. BMI 50.0-59.9, adult (HCC) Growth [place] 7/22 @26 /1 1007g (74%), AC 69%, transverse, anterior, MVP 4.72 Serial growth [place], weekly fetal testing at 34 weeks  Please refer to After Visit Summary for other counseling recommendations.   Return in about 2 weeks (around 12/07/2022) for return OB at 29-30 weeks.  Future Appointments  Date Time Provider Department Center  12/07/2022 10:55 AM Adam Phenix, MD [facility] None  12/13/2022  1:30 PM [facility] NURSE [facility]  12/13/2022  1:45 PM [facility] US5 [facility]  12/21/2022  9:55 AM Lennart Pall, MD [facility] None  12/22/2022  9:00 AM Ocie Doyne, MD [facility] None  01/10/2023  1:15 PM [facility] NURSE [facility]  01/10/2023  1:30 PM [facility] US4 [facility]  01/17/2023  1:15 PM [facility] NURSE [facility]  01/17/2023  1:30 PM [facility] US4 [facility]  01/24/2023  1:15 PM [facility] NURSE [facility]  01/24/2023  1:30 PM [facility] US5 [facility]    Lennart Pall, MD

## 2022-11-24 ENCOUNTER — Encounter: Payer: Self-pay | Admitting: Obstetrics and Gynecology

## 2022-11-24 ENCOUNTER — Other Ambulatory Visit: Payer: Self-pay | Admitting: Obstetrics and Gynecology

## 2022-11-24 DIAGNOSIS — O24419 Gestational diabetes mellitus in pregnancy, unspecified control: Secondary | ICD-10-CM

## 2022-11-24 DIAGNOSIS — D649 Anemia, unspecified: Secondary | ICD-10-CM | POA: Insufficient documentation

## 2022-11-24 NOTE — Progress Notes (Signed)
New dx GDM and iron deficiency anemia (hgb 8.8). DM educator referral & IV Iron orders placed.

## 2022-11-25 ENCOUNTER — Other Ambulatory Visit: Payer: Self-pay | Admitting: Emergency Medicine

## 2022-11-25 ENCOUNTER — Encounter: Payer: Self-pay | Admitting: Obstetrics and Gynecology

## 2022-11-25 DIAGNOSIS — O24419 Gestational diabetes mellitus in pregnancy, unspecified control: Secondary | ICD-10-CM

## 2022-11-25 DIAGNOSIS — A5901 Trichomonal vulvovaginitis: Secondary | ICD-10-CM

## 2022-11-25 HISTORY — DX: Gestational diabetes mellitus in pregnancy, unspecified control: O24.419

## 2022-11-25 HISTORY — DX: Trichomonal vulvovaginitis: A59.01

## 2022-11-25 MED ORDER — METRONIDAZOLE 500 MG PO TABS: 500.0000 mg | ORAL_TABLET | Freq: Two times a day (BID) | ORAL | 0 refills | Status: DC

## 2022-11-25 NOTE — Progress Notes (Signed)
Rx for [employer]

## 2022-11-25 NOTE — Progress Notes (Signed)
Rx for Trich tx

## 2022-11-26 ENCOUNTER — Telehealth: Payer: Self-pay | Admitting: Pharmacy Technician

## 2022-11-26 NOTE — Telephone Encounter (Signed)
Dr. Berton Lan, Patient will be scheduled as soon as possible.  Auth Submission: NO AUTH NEEDED Site of care: Site of care: [facility] Payer: [employer]BS Medication & CPT/J Code(s) submitted: Venofer (Iron Sucrose) J1756 Route of submission (phone, fax, portal):  Phone # Fax # Auth type: Buy/Bill PB Units/visits requested:  Reference number: [id number] Approval from: 11/26/22 to 03/28/23

## 2022-11-27 ENCOUNTER — Other Ambulatory Visit: Payer: Self-pay

## 2022-11-27 ENCOUNTER — Encounter (HOSPITAL_COMMUNITY): Payer: Self-pay | Admitting: Obstetrics and Gynecology

## 2022-11-27 ENCOUNTER — Inpatient Hospital Stay (HOSPITAL_COMMUNITY)
Admission: AD | Admit: 2022-11-27 | Discharge: 2022-11-27 | Disposition: A | Discharge status: Home / Self Care | Payer: BLUE CROSS/BLUE SHIELD | Attending: Obstetrics and Gynecology | Admitting: Obstetrics and Gynecology

## 2022-11-27 DIAGNOSIS — O26892 Other specified pregnancy related conditions, second trimester: Secondary | ICD-10-CM | POA: Insufficient documentation

## 2022-11-27 DIAGNOSIS — O368120 Decreased fetal movements, second trimester, not applicable or unspecified: Secondary | ICD-10-CM | POA: Insufficient documentation

## 2022-11-27 DIAGNOSIS — R110 Nausea: Secondary | ICD-10-CM | POA: Diagnosis not present

## 2022-11-27 DIAGNOSIS — O219 Vomiting of pregnancy, unspecified: Secondary | ICD-10-CM

## 2022-11-27 DIAGNOSIS — Z3A27 27 weeks gestation of pregnancy: Secondary | ICD-10-CM | POA: Insufficient documentation

## 2022-11-27 DIAGNOSIS — A599 Trichomoniasis, unspecified: Secondary | ICD-10-CM

## 2022-11-27 LAB — URINALYSIS, ROUTINE W REFLEX MICROSCOPIC
Bilirubin Urine: NEGATIVE
Glucose, UA: NEGATIVE mg/dL
Hgb urine dipstick: NEGATIVE
Ketones, ur: NEGATIVE mg/dL
Leukocytes,Ua: NEGATIVE
Nitrite: NEGATIVE
Protein, ur: NEGATIVE mg/dL
Specific Gravity, Urine: 1.01 (ref 1.005–1.030)
pH: 6 (ref 5.0–8.0)

## 2022-11-27 MED ORDER — ONDANSETRON 4 MG PO TBDP
ORAL_TABLET | ORAL | 0 refills | Status: DC
Start: 2022-11-27 — End: 2023-02-22

## 2022-11-27 NOTE — MAU Provider Note (Signed)
History     CSN: [id number]  Arrival date and time: 11/27/22 2145   Event Date/Time   First Provider Initiated Contact with Patient 11/27/22 2248      Chief Complaint  Patient presents with   Decreased Fetal Movement   Cynthia Horne is a 32 y.o. G3P1011 at [redacted]w[redacted]d who receives care at [facility].  She presents today for DFM.  Patient states yesterday around 1000 she noted movement had decreased.  She reports she took medicine and contributed the decrease to this.  She reports she noticed a decrease with each medication dosing.  She reports she is eating prior to medication administration and is also "drinking plenty of water" noting that cold water promotes fetal movement for her baby.  She does endorses lots of movement since arrival.   She denies contractions, but reports BH contractions that has been present throughout the day.  She states it causes abdominal tightening and some back pain.  She thinks they were occurring about ~ 15 minutes, but unsure of duration.   OB History     Gravida  3   Para  1   Term  1   Preterm      AB  1   Living  1      SAB  1   IAB      Ectopic      Multiple  0   Live Births  1           Past Medical History:  Diagnosis Date   Anemia    Migraine    Migraine headache with aura 11/05/2015   - Rx for Fioricet (use for Migraine)  - recommend Tylenol, increased H2O intake and frequent meals and snack for occ. HA   Ovarian cyst     Past Surgical History:  Procedure Laterality Date   OOPHORECTOMY Right 2005   ORIF ANKLE FRACTURE Right 12/31/2020   Procedure: OPEN REDUCTION INTERNAL FIXATION (ORIF) RIGHT ANKLE FRACTURE;  Surgeon: Huel Cote, MD;  Location: [facility] OR;  Service: Orthopedics;  Laterality: Right;    Family History  Problem Relation Age of Onset   Migraines Maternal Grandmother    Hypertension Maternal Grandmother    Cancer Maternal Grandfather        pancreatic    Social History   Tobacco Use   Smoking  status: Former    Current packs/day: 0.50    Types: Cigarettes, Cigars   Smokeless tobacco: Former   Tobacco comments:    quit with preg  Vaping Use   Vaping status: Never Used  Substance Use Topics   Alcohol use: No   Drug use: No    Allergies: No Known Allergies  Medications Prior to Admission  Medication Sig Dispense Refill Last Dose   aspirin EC 81 MG tablet Take 1 tablet (81 mg total) by mouth daily. Swallow whole. 30 tablet 12    cetirizine (ZYRTEC) 10 MG tablet Take 10 mg by mouth every other day.      Doxylamine-Pyridoxine (DICLEGIS PO) Take 10 mg by mouth at bedtime.      ferrous sulfate 324 (65 Fe) MG TBEC Take 1 tablet (325 mg total) by mouth every other day. 30 tablet 2    metoCLOPramide (REGLAN) 10 MG tablet Take 1 tablet (10 mg total) by mouth every 6 (six) hours as needed for nausea. 30 tablet 0    metroNIDAZOLE (FLAGYL) 500 MG tablet Take 1 tablet (500 mg total) by mouth 2 (two) times daily. 14  tablet 0    metroNIDAZOLE (FLAGYL) 500 MG tablet Take 1 tablet (500 mg total) by mouth 2 (two) times daily. 14 tablet 0    omeprazole (PRILOSEC OTC) 20 MG tablet Take 1 tablet (20 mg total) by mouth daily. 90 tablet 1    ondansetron (ZOFRAN-ODT) 4 MG disintegrating tablet Take 1 tablet (4 mg total) by mouth every 6 (six) hours as needed for nausea. 20 tablet 0    promethazine (PHENERGAN) 12.5 MG tablet Take 1-2 tablets (12.5-25 mg total) by mouth every 6 (six) hours as needed for nausea or vomiting. 30 tablet 0    scopolamine (TRANSDERM-SCOP) 1 MG/3DAYS Place 1 patch (1.5 mg total) onto the skin every 3 (three) days. 10 patch 12     Review of Systems  Gastrointestinal:  Positive for nausea (Prior to medication dosing). Negative for abdominal pain and vomiting.  Genitourinary:  Negative for difficulty urinating, dysuria and vaginal bleeding.  Neurological:  Negative for dizziness, light-headedness and headaches.   Physical Exam   Blood pressure 133/70, pulse 99, temperature  98 F (36.7 C), resp. rate 12, SpO2 99%, unknown if currently breastfeeding.  Physical Exam Vitals reviewed.  Constitutional:      Appearance: Normal appearance.  HENT:     Head: Normocephalic and atraumatic.  Eyes:     Conjunctiva/sclera: Conjunctivae normal.  Cardiovascular:     Rate and Rhythm: Normal rate.  Pulmonary:     Effort: Pulmonary effort is normal. No respiratory distress.  Musculoskeletal:        General: Normal range of motion.     Cervical back: Normal range of motion.  Neurological:     Mental Status: She is alert and oriented to person, place, and time.  Psychiatric:        Mood and Affect: Mood normal.        Behavior: Behavior normal.     Fetal Assessment 145 bpm, Mod Var, -Decels, +Accels Toco: No ctx graphed   MAU Course   Results for orders placed or performed during the hospital encounter of 11/27/22 (from the past 24 hour(s))  Urinalysis, Routine w reflex microscopic -Urine, Clean Catch     Status: None   Collection Time: 11/27/22 10:03 PM  Result Value Ref Range   Color, Urine YELLOW YELLOW   APPearance CLEAR CLEAR   Specific Gravity, Urine 1.010 1.005 - 1.030   pH 6.0 5.0 - 8.0   Glucose, UA NEGATIVE NEGATIVE mg/dL   Hgb urine dipstick NEGATIVE NEGATIVE   Bilirubin Urine NEGATIVE NEGATIVE   Ketones, ur NEGATIVE NEGATIVE mg/dL   Protein, ur NEGATIVE NEGATIVE mg/dL   Nitrite NEGATIVE NEGATIVE   Leukocytes,Ua NEGATIVE NEGATIVE   No results found.  MDM PE Labs: UA EFM Assessment and Plan  32 year old G3P1011  SIUP at 27.6 weeks Cat I FT DFM Nausea  -POC Reviewed -Exam performed. -EFM reassuring for GA. -Discussed taking Zofran 30 minutes prior to Metronidazole dosing to avoid feelings of nausea.  -Reassured that fetus not affected by medication dosing. -Patient verbalizes understanding. -Monitor and reassess.   Cherre Robins MSN, CNM 11/27/2022, 10:48 PM   Reassessment (11:23 PM) -FHT remains reassuring. -Precautions to  be given by nurse. -Encouraged to call primary office or return to MAU if symptoms worsen or with the onset of new symptoms. -Discharged to home in stable condition.  Cherre Robins MSN, CNM Advanced Practice Provider, Center for [employer]

## 2022-11-27 NOTE — MAU Note (Signed)
.  Cynthia Horne is a 32 y.o. at [redacted]w[redacted]d here in MAU reporting: Pt reports she was diagnosed with an STD 3-4 days ago. Pt reports she has taken 3 doses for the treatment. Pt reports she has not been feeling good since she took the first dose. Pt reports decreased fetal movement since the first dose.   Onset of complaint: since first dose Pain score: 7/10 There were no vitals filed for this visit.    Lab orders placed from triage:   ua

## 2022-12-01 ENCOUNTER — Ambulatory Visit: Payer: BLUE CROSS/BLUE SHIELD | Admitting: Dietician

## 2022-12-06 ENCOUNTER — Ambulatory Visit (INDEPENDENT_AMBULATORY_CARE_PROVIDER_SITE_OTHER): Payer: BLUE CROSS/BLUE SHIELD

## 2022-12-06 VITALS — BP 109/63 | HR 121 | Temp 98.0°F | Resp 18 | Ht 66.0 in | Wt 306.2 lb

## 2022-12-06 DIAGNOSIS — D508 Other iron deficiency anemias: Secondary | ICD-10-CM | POA: Diagnosis not present

## 2022-12-06 DIAGNOSIS — O99013 Anemia complicating pregnancy, third trimester: Secondary | ICD-10-CM

## 2022-12-06 DIAGNOSIS — Z3A29 29 weeks gestation of pregnancy: Secondary | ICD-10-CM

## 2022-12-06 MED ORDER — ACETAMINOPHEN 325 MG PO TABS
650.0000 mg | ORAL_TABLET | Freq: Once | ORAL | Status: AC
  Administered 2022-12-06: 650 mg via ORAL
  Filled 2022-12-06: qty 2

## 2022-12-06 MED ORDER — SODIUM CHLORIDE 0.9 % IV SOLN
200.0000 mg | Freq: Once | INTRAVENOUS | Status: AC
  Administered 2022-12-06: 200 mg via INTRAVENOUS
  Filled 2022-12-06: qty 10

## 2022-12-06 MED ORDER — DIPHENHYDRAMINE HCL 25 MG PO CAPS
25.0000 mg | ORAL_CAPSULE | Freq: Once | ORAL | Status: AC
  Administered 2022-12-06: 25 mg via ORAL
  Filled 2022-12-06: qty 1

## 2022-12-06 NOTE — Progress Notes (Signed)
Diagnosis: Acute Anemia  Provider:  Chilton Greathouse MD  Procedure: IV Infusion  IV Type: Peripheral, IV Location: L Antecubital  Venofer (Iron Sucrose), Dose: 200 mg  Infusion Start Time: 1512  Infusion Stop Time: 1559  Post Infusion IV Care: Observation period completed. PIV removed  Discharge: Condition: Good, Destination: Home . AVS Declined  Performed by:  Wyvonne Lenz, RN

## 2022-12-07 ENCOUNTER — Encounter: Payer: Self-pay | Admitting: *Deleted

## 2022-12-07 ENCOUNTER — Telehealth (INDEPENDENT_AMBULATORY_CARE_PROVIDER_SITE_OTHER): Payer: BLUE CROSS/BLUE SHIELD | Admitting: Obstetrics & Gynecology

## 2022-12-07 DIAGNOSIS — O23593 Infection of other part of genital tract in pregnancy, third trimester: Secondary | ICD-10-CM

## 2022-12-07 DIAGNOSIS — O24419 Gestational diabetes mellitus in pregnancy, unspecified control: Secondary | ICD-10-CM | POA: Diagnosis not present

## 2022-12-07 DIAGNOSIS — O99213 Obesity complicating pregnancy, third trimester: Secondary | ICD-10-CM | POA: Diagnosis not present

## 2022-12-07 DIAGNOSIS — O0993 Supervision of high risk pregnancy, unspecified, third trimester: Secondary | ICD-10-CM | POA: Diagnosis not present

## 2022-12-07 DIAGNOSIS — O99210 Obesity complicating pregnancy, unspecified trimester: Secondary | ICD-10-CM

## 2022-12-07 DIAGNOSIS — O0990 Supervision of high risk pregnancy, unspecified, unspecified trimester: Secondary | ICD-10-CM

## 2022-12-07 DIAGNOSIS — Z3A29 29 weeks gestation of pregnancy: Secondary | ICD-10-CM

## 2022-12-07 DIAGNOSIS — A5901 Trichomonal vulvovaginitis: Secondary | ICD-10-CM

## 2022-12-07 MED ORDER — ACCU-CHEK GUIDE VI STRP
ORAL_STRIP | 5 refills | Status: DC
Start: 2022-12-07 — End: 2023-02-22

## 2022-12-07 MED ORDER — ACCU-CHEK SOFTCLIX LANCETS MISC: 5 refills | Status: DC

## 2022-12-07 MED ORDER — ACCU-CHEK GUIDE ME W/DEVICE KIT: PACK | 0 refills | Status: DC

## 2022-12-07 NOTE — Progress Notes (Signed)
I connected with Cynthia Horne 12/07/22 at 10:55 AM EDT by: MyChart video and verified that I am speaking with the correct person using two identifiers.  Patient is located at home and provider is located at [place].     I discussed the limitations, risks, security and privacy concerns of performing an evaluation and management service by MyChart video and the availability of in person appointments. I also discussed with the patient that there may be a patient responsible charge related to this service. By engaging in this virtual visit, you consent to the provision of healthcare.  Additionally, you authorize for your insurance to be billed for the services provided during this visit.  The patient expressed understanding and agreed to proceed.  The following staff members participated in the virtual visit:      PRENATAL VISIT NOTE  Subjective:  Cynthia Horne is a 31 y.o. G3P1011 at [redacted]w[redacted]d  for virtual visit for ongoing prenatal care.  She is currently monitored for the following issues for this high-risk pregnancy and has Obesity affecting pregnancy, antepartum; Supervision of high-risk pregnancy; ASCUS of cervix with negative high risk HPV; Chronic headache; GDM (gestational diabetes mellitus); Anemia; and Trichomonal vaginitis in pregnancy on their problem list.  Patient reports no complaints.  Contractions: Irregular. Vag. Bleeding: None.  Movement: Present. Denies leaking of fluid.   The following portions of the patient's history were reviewed and updated as appropriate: allergies, current medications, past family history, past medical history, past social history, past surgical history and problem list.   Objective:  There were no vitals filed for this visit. Self-Obtained  Fetal Status:     Movement: Present     Assessment and Plan:  Pregnancy: G3P1011 at [redacted]w[redacted]d 1. Gestational diabetes mellitus (GDM) in third trimester, gestational diabetes method of control unspecified Needs to start  diet and testing - Blood Glucose Monitoring Suppl (ACCU-CHEK GUIDE ME) w/Device KIT; Use glucose meter to check glucose 4 times daily  Dispense: 1 kit; Refill: 0 - glucose blood (ACCU-CHEK GUIDE) test strip; Use 1 test strip to check glucose 4 times daily  Dispense: 100 each; Refill: 5 - Accu-Chek Softclix Lancets lancets; Use 1 lancet to check glucose 4 times daily.  Dispense: 100 each; Refill: 5  2. Trichomonal vaginitis during pregnancy in third trimester TOC at next visit  3. Obesity affecting pregnancy, antepartum, unspecified obesity type   4. Supervision of high risk pregnancy, antepartum   Preterm labor symptoms and general obstetric precautions including but not limited to vaginal bleeding, contractions, leaking of fluid and fetal movement were reviewed in detail with the patient.  Return in about 2 weeks (around 12/21/2022).  Future Appointments  Date Time Provider Department Center  12/08/2022 11:15 AM [facility] 8 [facility] None  12/08/2022  2:45 PM [facility] GDM CLASS [facility] NDM  12/10/2022  2:30 PM [facility] 8 [facility] None  12/13/2022  1:30 PM [facility] NURSE [facility]  12/13/2022  1:45 PM [facility] US5 [facility]  12/14/2022  9:15 AM [facility] 8 [facility] None  12/16/2022  9:15 AM [facility] 8 [facility] None  12/21/2022  9:55 AM Lennart Pall, MD [facility] None  12/22/2022  9:00 AM Ocie Doyne, MD [facility] None  01/10/2023  1:15 PM [facility] NURSE [facility]  01/10/2023  1:30 PM [facility] US4 [facility]  01/17/2023  1:15 PM [facility] NURSE [facility]  01/17/2023  1:30 PM [facility] US4 [facility]  01/24/2023  1:15 PM [facility] NURSE [facility]  01/24/2023  1:30 PM [facility] US5  [facility]     Time spent on virtual visit: 10 minutes  Scheryl Darter, MD

## 2022-12-07 NOTE — Progress Notes (Signed)
MyChart OB, wants to talk about her Lab results.

## 2022-12-08 ENCOUNTER — Encounter: Payer: Self-pay | Admitting: Dietician

## 2022-12-08 ENCOUNTER — Ambulatory Visit (INDEPENDENT_AMBULATORY_CARE_PROVIDER_SITE_OTHER): Payer: BLUE CROSS/BLUE SHIELD

## 2022-12-08 ENCOUNTER — Encounter: Payer: BLUE CROSS/BLUE SHIELD | Attending: Obstetrics and Gynecology | Admitting: Dietician

## 2022-12-08 VITALS — BP 111/73 | HR 122 | Temp 97.9°F | Resp 18 | Ht 66.0 in | Wt 305.2 lb

## 2022-12-08 DIAGNOSIS — O24415 Gestational diabetes mellitus in pregnancy, controlled by oral hypoglycemic drugs: Secondary | ICD-10-CM | POA: Insufficient documentation

## 2022-12-08 DIAGNOSIS — D508 Other iron deficiency anemias: Secondary | ICD-10-CM

## 2022-12-08 DIAGNOSIS — Z3A29 29 weeks gestation of pregnancy: Secondary | ICD-10-CM

## 2022-12-08 DIAGNOSIS — O99013 Anemia complicating pregnancy, third trimester: Secondary | ICD-10-CM

## 2022-12-08 MED ORDER — ACETAMINOPHEN 325 MG PO TABS
650.0000 mg | ORAL_TABLET | Freq: Once | ORAL | Status: AC
  Administered 2022-12-08: 650 mg via ORAL
  Filled 2022-12-08: qty 2

## 2022-12-08 MED ORDER — DIPHENHYDRAMINE HCL 25 MG PO CAPS
25.0000 mg | ORAL_CAPSULE | Freq: Once | ORAL | Status: AC
  Administered 2022-12-08: 25 mg via ORAL
  Filled 2022-12-08: qty 1

## 2022-12-08 MED ORDER — SODIUM CHLORIDE 0.9 % IV SOLN
200.0000 mg | Freq: Once | INTRAVENOUS | Status: AC
  Administered 2022-12-08: 200 mg via INTRAVENOUS
  Filled 2022-12-08: qty 10

## 2022-12-08 NOTE — Progress Notes (Signed)
Diagnosis: Iron Deficiency Anemia  Provider:  Chilton Greathouse MD  Procedure: IV Infusion  IV Type: Peripheral, IV Location: L Antecubital  Venofer (Iron Sucrose), Dose: 200 mg  Infusion Start Time: 1237  Infusion Stop Time: 1254  Post Infusion IV Care: Patient declined observation and Peripheral IV Discontinued  Discharge: Condition: Good, Destination: Home . AVS Declined  Performed by:  Adriana Mccallum, RN

## 2022-12-08 NOTE — Progress Notes (Signed)
Patient was seen on 12/08/2022 for Gestational Diabetes self-management class at the [facility]. The following learning objectives were met by the patient during this course:  States the definition of Gestational Diabetes States why dietary management is important in controlling blood glucose Describes the effects each nutrient has on blood glucose levels Demonstrates ability to create a balanced meal plan Demonstrates carbohydrate counting  States when to check blood glucose levels Demonstrates proper blood glucose monitoring techniques States the effect of stress and exercise on blood glucose levels States the importance of limiting caffeine and abstaining from alcohol and smoking  Blood glucose monitor given: Accu Chek Guide Lot # [id number] Exp: 11/12/2023 Blood glucose reading: 87  Patient instructed to monitor glucose levels: FBS: 60 - <90 1 hour: <140 2 hour: <120  *Patient received handouts: Nutrition Diabetes and Pregnancy Carbohydrate Counting List  Patient will be seen for follow-up as needed.

## 2022-12-10 MED ORDER — SODIUM CHLORIDE 0.9 % IV SOLN
200.0000 mg | Freq: Once | INTRAVENOUS | Status: DC
  Filled 2022-12-10: qty 10

## 2022-12-10 MED ORDER — ACETAMINOPHEN 325 MG PO TABS: 650.0000 mg | ORAL_TABLET | Freq: Once | ORAL | Status: DC

## 2022-12-10 MED ORDER — DIPHENHYDRAMINE HCL 25 MG PO CAPS: 25.0000 mg | ORAL_CAPSULE | Freq: Once | ORAL | Status: DC

## 2022-12-13 ENCOUNTER — Ambulatory Visit: Payer: BLUE CROSS/BLUE SHIELD

## 2022-12-14 ENCOUNTER — Ambulatory Visit: Payer: BLUE CROSS/BLUE SHIELD

## 2022-12-14 MED ORDER — SODIUM CHLORIDE 0.9 % IV SOLN
200.0000 mg | Freq: Once | INTRAVENOUS | Status: AC
  Filled 2022-12-14: qty 10

## 2022-12-14 MED ORDER — DIPHENHYDRAMINE HCL 25 MG PO CAPS: 25.0000 mg | ORAL_CAPSULE | Freq: Once | ORAL | Status: AC

## 2022-12-14 MED ORDER — ACETAMINOPHEN 325 MG PO TABS: 650.0000 mg | ORAL_TABLET | Freq: Once | ORAL | Status: AC

## 2022-12-16 MED ORDER — DIPHENHYDRAMINE HCL 25 MG PO CAPS: 25.0000 mg | ORAL_CAPSULE | Freq: Once | ORAL | Status: DC

## 2022-12-16 MED ORDER — ACETAMINOPHEN 325 MG PO TABS: 650.0000 mg | ORAL_TABLET | Freq: Once | ORAL | Status: DC

## 2022-12-16 MED ORDER — SODIUM CHLORIDE 0.9 % IV SOLN
200.0000 mg | Freq: Once | INTRAVENOUS | Status: DC
  Filled 2022-12-16: qty 10

## 2022-12-21 ENCOUNTER — Encounter: Payer: BLUE CROSS/BLUE SHIELD | Admitting: Obstetrics and Gynecology

## 2022-12-22 ENCOUNTER — Ambulatory Visit: Payer: Medicaid Other | Admitting: Psychiatry

## 2022-12-22 ENCOUNTER — Encounter: Payer: Self-pay | Admitting: Psychiatry
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
## 2023-01-10 ENCOUNTER — Ambulatory Visit: Payer: BLUE CROSS/BLUE SHIELD

## 2023-01-10 ENCOUNTER — Ambulatory Visit: Payer: BLUE CROSS/BLUE SHIELD | Attending: Obstetrics

## 2023-01-12 ENCOUNTER — Encounter: Payer: Self-pay | Admitting: *Deleted

## 2023-01-14 ENCOUNTER — Ambulatory Visit: Payer: BLUE CROSS/BLUE SHIELD | Attending: Advanced Practice Midwife

## 2023-01-14 ENCOUNTER — Ambulatory Visit: Payer: BLUE CROSS/BLUE SHIELD

## 2023-01-17 ENCOUNTER — Ambulatory Visit: Payer: BLUE CROSS/BLUE SHIELD | Attending: Obstetrics

## 2023-01-17 ENCOUNTER — Ambulatory Visit: Payer: BLUE CROSS/BLUE SHIELD

## 2023-01-24 ENCOUNTER — Ambulatory Visit: Payer: BLUE CROSS/BLUE SHIELD

## 2023-01-24 ENCOUNTER — Ambulatory Visit: Payer: BLUE CROSS/BLUE SHIELD | Attending: Obstetrics

## 2023-01-28 ENCOUNTER — Inpatient Hospital Stay (HOSPITAL_COMMUNITY)
Admission: AD | Admit: 2023-01-28 | Discharge: 2023-01-28 | Disposition: A | Discharge status: Home / Self Care | Payer: BLUE CROSS/BLUE SHIELD | Attending: Family Medicine | Admitting: Family Medicine

## 2023-01-28 ENCOUNTER — Encounter (HOSPITAL_COMMUNITY): Payer: Self-pay | Admitting: Family Medicine

## 2023-01-28 DIAGNOSIS — Z3A36 36 weeks gestation of pregnancy: Secondary | ICD-10-CM | POA: Insufficient documentation

## 2023-01-28 DIAGNOSIS — Z87891 Personal history of nicotine dependence: Secondary | ICD-10-CM | POA: Insufficient documentation

## 2023-01-28 DIAGNOSIS — O4703 False labor before 37 completed weeks of gestation, third trimester: Secondary | ICD-10-CM | POA: Diagnosis not present

## 2023-01-28 DIAGNOSIS — O26893 Other specified pregnancy related conditions, third trimester: Secondary | ICD-10-CM | POA: Diagnosis present

## 2023-01-28 DIAGNOSIS — Z3689 Encounter for other specified antenatal screening: Secondary | ICD-10-CM | POA: Diagnosis not present

## 2023-01-28 DIAGNOSIS — R519 Headache, unspecified: Secondary | ICD-10-CM | POA: Insufficient documentation

## 2023-01-28 DIAGNOSIS — O0990 Supervision of high risk pregnancy, unspecified, unspecified trimester: Secondary | ICD-10-CM

## 2023-01-28 LAB — URINALYSIS, ROUTINE W REFLEX MICROSCOPIC
Bilirubin Urine: NEGATIVE
Glucose, UA: 50 mg/dL — AB
Hgb urine dipstick: NEGATIVE
Ketones, ur: NEGATIVE mg/dL
Leukocytes,Ua: NEGATIVE
Nitrite: NEGATIVE
Protein, ur: NEGATIVE mg/dL
Specific Gravity, Urine: 1.018 (ref 1.005–1.030)
pH: 6 (ref 5.0–8.0)

## 2023-01-28 MED ORDER — ONDANSETRON 4 MG PO TBDP
8.0000 mg | ORAL_TABLET | Freq: Once | ORAL | Status: AC
  Administered 2023-01-28: 8 mg via ORAL
  Filled 2023-01-28: qty 2

## 2023-01-28 MED ORDER — ACETAMINOPHEN-CAFFEINE 500-65 MG PO TABS
2.0000 | ORAL_TABLET | Freq: Once | ORAL | Status: AC
  Administered 2023-01-28: 2 via ORAL
  Filled 2023-01-28: qty 2

## 2023-01-28 NOTE — MAU Note (Signed)
Pt says she feels UC's - started last mth but  Today closer 9/10 [facility]- [place] - did not call. Last sex- Tuesday  Feels UC's and then pressure in pelvis.

## 2023-01-28 NOTE — MAU Provider Note (Signed)
History     CSN: [id number]  Arrival date and time: 01/28/23 1921   None     Chief Complaint  Patient presents with   Contractions    Cynthia Horne is a 32 y.o. G3P1011 at [redacted]w[redacted]d who receives care at [facility].  She presents today for contractions, but also reports HA.  She states the HA started on Tuesday afternoon and she took Tylenol (1 XR tablet) Wednesday with some relief.  She states she has not taken any other medications.  She states the HA is located in her forehead and radiates to behind her ears.  She reports a history of migraines, but denies symptoms and states "it just feels like a regular HA."  She denies worsening factors and notes sleeping improves symptoms. She endorses fetal movement and denies vaginal bleeding or discharge.    OB History     Gravida  3   Para  1   Term  1   Preterm      AB  1   Living  1      SAB  1   IAB      Ectopic      Multiple  0   Live Births  1           Past Medical History:  Diagnosis Date   Anemia    Chronic headache 11/23/2022   [ ]  f/u neurology recs 8/28     GDM (gestational diabetes mellitus) 11/2022   Migraine    Migraine headache with aura 11/05/2015   - Rx for Fioricet (use for Migraine)  - recommend Tylenol, increased H2O intake and frequent meals and snack for occ. HA   Ovarian cyst    Trichomonal vaginitis in pregnancy 11/25/2022   Dx 27w  [ ]  TOC 39wk or PP      Past Surgical History:  Procedure Laterality Date   OOPHORECTOMY Right 2005   ORIF ANKLE FRACTURE Right 12/31/2020   Procedure: OPEN REDUCTION INTERNAL FIXATION (ORIF) RIGHT ANKLE FRACTURE;  Surgeon: Huel Cote, MD;  Location: [facility] OR;  Service: Orthopedics;  Laterality: Right;    Family History  Problem Relation Age of Onset   Migraines Maternal Grandmother    Hypertension Maternal Grandmother    Cancer Maternal Grandfather        pancreatic    Social History   Tobacco Use   Smoking status: Former    Current packs/day:  0.50    Types: Cigarettes, Cigars   Smokeless tobacco: Former   Tobacco comments:    quit with preg  Vaping Use   Vaping status: Never Used  Substance Use Topics   Alcohol use: No   Drug use: No    Allergies: No Known Allergies  Medications Prior to Admission  Medication Sig Dispense Refill Last Dose   Prenatal Vit-Fe Fumarate-FA (MULTIVITAMIN-PRENATAL) 27-0.8 MG TABS tablet Take 1 tablet by mouth daily at 12 noon.   01/27/2023   Accu-Chek Softclix Lancets lancets Use 1 lancet to check glucose 4 times daily. 100 each 5    aspirin EC 81 MG tablet Take 1 tablet (81 mg total) by mouth daily. Swallow whole. 30 tablet 12 More than a month   Blood Glucose Monitoring Suppl (ACCU-CHEK GUIDE ME) w/Device KIT Use glucose meter to check glucose 4 times daily 1 kit 0    cetirizine (ZYRTEC) 10 MG tablet Take 10 mg by mouth every other day.      Doxylamine-Pyridoxine (DICLEGIS PO) Take 10 mg by mouth  at bedtime.      ferrous sulfate 324 (65 Fe) MG TBEC Take 1 tablet (325 mg total) by mouth every other day. 30 tablet 2    glucose blood (ACCU-CHEK GUIDE) test strip Use 1 test strip to check glucose 4 times daily 100 each 5    metroNIDAZOLE (FLAGYL) 500 MG tablet Take 1 tablet (500 mg total) by mouth 2 (two) times daily. 14 tablet 0    metroNIDAZOLE (FLAGYL) 500 MG tablet Take 1 tablet (500 mg total) by mouth 2 (two) times daily. 14 tablet 0    omeprazole (PRILOSEC OTC) 20 MG tablet Take 1 tablet (20 mg total) by mouth daily. 90 tablet 1    ondansetron (ZOFRAN-ODT) 4 MG disintegrating tablet Take one tablet 30 minutes prior to medication. 10 tablet 0    promethazine (PHENERGAN) 12.5 MG tablet Take 1-2 tablets (12.5-25 mg total) by mouth every 6 (six) hours as needed for nausea or vomiting. 30 tablet 0    scopolamine (TRANSDERM-SCOP) 1 MG/3DAYS Place 1 patch (1.5 mg total) onto the skin every 3 (three) days. 10 patch 12     Review of Systems  Gastrointestinal:  Positive for abdominal pain  (Contractions) and nausea. Negative for constipation, diarrhea and vomiting.  Genitourinary:  Positive for pelvic pain (Pressure). Negative for difficulty urinating, dysuria, vaginal bleeding and vaginal discharge.  Neurological:  Positive for light-headedness (With standing for extended periods) and headaches. Negative for dizziness.   Physical Exam   Blood pressure 127/69, pulse (!) 128, temperature 98 F (36.7 C), temperature source Oral, resp. rate 16, height 5\' 5"  (1.651 m), weight (!) 143.7 kg, SpO2 98%, unknown if currently breastfeeding.  Physical Exam Vitals reviewed.  Constitutional:      General: She is not in acute distress.    Appearance: Normal appearance.  HENT:     Head: Normocephalic and atraumatic.  Eyes:     Conjunctiva/sclera: Conjunctivae normal.  Cardiovascular:     Rate and Rhythm: Normal rate.  Pulmonary:     Effort: Pulmonary effort is normal. No respiratory distress.  Abdominal:     Comments: Gravid Appears AGA  Genitourinary:    General: Normal vulva.  Musculoskeletal:        General: Normal range of motion.     Cervical back: Normal range of motion.  Skin:    General: Skin is warm and dry.  Neurological:     Mental Status: She is alert and oriented to person, place, and time.     Cranial Nerves: Cranial nerves 2-12 are intact.  Psychiatric:        Mood and Affect: Mood normal.        Behavior: Behavior normal.     Fetal Assessment 145 bpm, Mod Var, -Decels, +Accels Toco: Occasional Ctx, Mild Irritability  MAU Course   Results for orders placed or performed during the hospital encounter of 01/28/23 (from the past 24 hour(s))  Urinalysis, Routine w reflex microscopic -Urine, Clean Catch     Status: Abnormal   Collection Time: 01/28/23  8:33 PM  Result Value Ref Range   Color, Urine YELLOW YELLOW   APPearance CLEAR CLEAR   Specific Gravity, Urine 1.018 1.005 - 1.030   pH 6.0 5.0 - 8.0   Glucose, UA 50 (A) NEGATIVE mg/dL   Hgb urine  dipstick NEGATIVE NEGATIVE   Bilirubin Urine NEGATIVE NEGATIVE   Ketones, ur NEGATIVE NEGATIVE mg/dL   Protein, ur NEGATIVE NEGATIVE mg/dL   Nitrite NEGATIVE NEGATIVE   Leukocytes,Ua NEGATIVE NEGATIVE  No results found.  MDM PE Labs: UA EFM Pain Medication Antiemetic Assessment and Plan  32 year old G3P1011  SIUP at 36.5weeks Cat I FT Headache  -POC Reviewed -Exam performed and findings discussed. -Discussed usage of Excedrin Migraine and patient agreeable. -Patient reports some mild nausea and agreeable to Zofran. 8mg  ordered.  -Monitor and reassess.  -NST reactive  Cherre Robins MSN, CNM 01/28/2023, 8:55 PM   Reassessment (10:03 PM) Patient reports improvement in HA and now 5/10. -Requests discharge. -Precautions to be reviewed by nurse. -Encouraged to call primary office or return to MAU if symptoms worsen or with the onset of new symptoms. -Discharged to home in improving condition.  Cherre Robins MSN, CNM Advanced Practice Provider, Center for [employer]

## 2023-02-18 ENCOUNTER — Encounter: Payer: Self-pay | Admitting: Obstetrics & Gynecology

## 2023-02-18 ENCOUNTER — Other Ambulatory Visit: Payer: Self-pay | Admitting: Obstetrics & Gynecology

## 2023-02-18 ENCOUNTER — Telehealth: Payer: Self-pay | Admitting: Obstetrics & Gynecology

## 2023-02-18 DIAGNOSIS — O0990 Supervision of high risk pregnancy, unspecified, unspecified trimester: Secondary | ICD-10-CM

## 2023-02-18 DIAGNOSIS — O0930 Supervision of pregnancy with insufficient antenatal care, unspecified trimester: Secondary | ICD-10-CM | POA: Insufficient documentation

## 2023-02-18 HISTORY — DX: Supervision of pregnancy with insufficient antenatal care, unspecified trimester: O09.30

## 2023-02-18 NOTE — Telephone Encounter (Signed)
     Faculty Practice OB/GYN Physician Phone Call Documentation  I had a phone conversation with Loel Ro about need for induction of labor for her GDM, insufficient care in the third trimester.  She called MFM today, wanting a repeat ultrasound.  Dr. Parke Poisson called me and wanted me to schedule IOL for this patient given her risk factors, in the next couple of days.  Last visit was on 12/07/22, last MFM scan was on 11/15/22.   Patient was given the option of coming in 10/26 or 10/27 for IOL.  She initially was hesitant and wanted to follow up in the office, she was told that this was not recommended and that the standard of care was IOL by 40 weeks given her GDM.  Also concerned that we do not know how good her control is, so timely IOL is recommended to prevent fetal/maternal morbidity and mortality.  She reports good fetal movement, no bleeding, rare contractions, no LOF. Was advised to come in if any of these situations change or for any other concerns.    Patient said she will call back with her choice of induction dates.  She was given L&D number, told to call back as soon as she can.  Labor and fetal movement precautions re-emphasized.   L&D nurse in charge and staff notified.    Jaynie Collins, MD, FACOG Obstetrician & Gynecologist, [facility] for [employer], [facility]

## 2023-02-20 ENCOUNTER — Inpatient Hospital Stay (HOSPITAL_COMMUNITY): Payer: BLUE CROSS/BLUE SHIELD | Admitting: Anesthesiology

## 2023-02-20 ENCOUNTER — Encounter (HOSPITAL_COMMUNITY): Payer: Self-pay | Admitting: Obstetrics & Gynecology

## 2023-02-20 ENCOUNTER — Other Ambulatory Visit: Payer: Self-pay

## 2023-02-20 ENCOUNTER — Inpatient Hospital Stay (HOSPITAL_COMMUNITY)
Admission: RE | Admit: 2023-02-20 | Discharge: 2023-02-22 | DRG: 807 | Disposition: A | Discharge status: Home / Self Care | Payer: BLUE CROSS/BLUE SHIELD | Attending: Obstetrics and Gynecology | Admitting: Obstetrics and Gynecology

## 2023-02-20 ENCOUNTER — Inpatient Hospital Stay (HOSPITAL_COMMUNITY): Payer: BLUE CROSS/BLUE SHIELD

## 2023-02-20 DIAGNOSIS — O9962 Diseases of the digestive system complicating childbirth: Secondary | ICD-10-CM | POA: Diagnosis present

## 2023-02-20 DIAGNOSIS — K219 Gastro-esophageal reflux disease without esophagitis: Secondary | ICD-10-CM | POA: Diagnosis present

## 2023-02-20 DIAGNOSIS — O9832 Other infections with a predominantly sexual mode of transmission complicating childbirth: Secondary | ICD-10-CM | POA: Diagnosis not present

## 2023-02-20 DIAGNOSIS — O24420 Gestational diabetes mellitus in childbirth, diet controlled: Principal | ICD-10-CM | POA: Diagnosis present

## 2023-02-20 DIAGNOSIS — Z3A40 40 weeks gestation of pregnancy: Secondary | ICD-10-CM

## 2023-02-20 DIAGNOSIS — I959 Hypotension, unspecified: Secondary | ICD-10-CM | POA: Diagnosis present

## 2023-02-20 DIAGNOSIS — A5901 Trichomonal vulvovaginitis: Secondary | ICD-10-CM | POA: Diagnosis present

## 2023-02-20 DIAGNOSIS — Z8489 Family history of other specified conditions: Secondary | ICD-10-CM | POA: Diagnosis not present

## 2023-02-20 DIAGNOSIS — O99214 Obesity complicating childbirth: Secondary | ICD-10-CM | POA: Diagnosis present

## 2023-02-20 DIAGNOSIS — Z87891 Personal history of nicotine dependence: Secondary | ICD-10-CM

## 2023-02-20 DIAGNOSIS — O9902 Anemia complicating childbirth: Secondary | ICD-10-CM | POA: Diagnosis present

## 2023-02-20 DIAGNOSIS — O480 Post-term pregnancy: Secondary | ICD-10-CM | POA: Diagnosis not present

## 2023-02-20 DIAGNOSIS — O99892 Other specified diseases and conditions complicating childbirth: Secondary | ICD-10-CM | POA: Diagnosis present

## 2023-02-20 DIAGNOSIS — Z7982 Long term (current) use of aspirin: Secondary | ICD-10-CM | POA: Diagnosis not present

## 2023-02-20 DIAGNOSIS — O0930 Supervision of pregnancy with insufficient antenatal care, unspecified trimester: Secondary | ICD-10-CM

## 2023-02-20 DIAGNOSIS — Z8249 Family history of ischemic heart disease and other diseases of the circulatory system: Secondary | ICD-10-CM | POA: Diagnosis not present

## 2023-02-20 DIAGNOSIS — D649 Anemia, unspecified: Secondary | ICD-10-CM | POA: Diagnosis present

## 2023-02-20 DIAGNOSIS — O24419 Gestational diabetes mellitus in pregnancy, unspecified control: Principal | ICD-10-CM | POA: Diagnosis present

## 2023-02-20 DIAGNOSIS — O99210 Obesity complicating pregnancy, unspecified trimester: Secondary | ICD-10-CM | POA: Diagnosis present

## 2023-02-20 DIAGNOSIS — O0990 Supervision of high risk pregnancy, unspecified, unspecified trimester: Secondary | ICD-10-CM

## 2023-02-20 DIAGNOSIS — O23592 Infection of other part of genital tract in pregnancy, second trimester: Secondary | ICD-10-CM | POA: Diagnosis present

## 2023-02-20 LAB — RPR: RPR Ser Ql: NONREACTIVE

## 2023-02-20 LAB — CBC
HCT: 33.5 % — ABNORMAL LOW (ref 36.0–46.0)
Hemoglobin: 10.4 g/dL — ABNORMAL LOW (ref 12.0–15.0)
MCH: 25.1 pg — ABNORMAL LOW (ref 26.0–34.0)
MCHC: 31 g/dL (ref 30.0–36.0)
MCV: 80.9 fL (ref 80.0–100.0)
Platelets: 246 10*3/uL (ref 150–400)
RBC: 4.14 MIL/uL (ref 3.87–5.11)
RDW: 21.3 % — ABNORMAL HIGH (ref 11.5–15.5)
WBC: 11.8 10*3/uL — ABNORMAL HIGH (ref 4.0–10.5)
nRBC: 0.2 % (ref 0.0–0.2)

## 2023-02-20 LAB — COMPREHENSIVE METABOLIC PANEL
ALT: 10 U/L (ref 0–44)
AST: 16 U/L (ref 15–41)
Albumin: 2.7 g/dL — ABNORMAL LOW (ref 3.5–5.0)
Alkaline Phosphatase: 179 U/L — ABNORMAL HIGH (ref 38–126)
Anion gap: 8 (ref 5–15)
BUN: 6 mg/dL (ref 6–20)
CO2: 21 mmol/L — ABNORMAL LOW (ref 22–32)
Calcium: 11.4 mg/dL — ABNORMAL HIGH (ref 8.9–10.3)
Chloride: 106 mmol/L (ref 98–111)
Creatinine, Ser: 0.47 mg/dL (ref 0.44–1.00)
GFR, Estimated: >60 mL/min (ref >60)
Glucose, Bld: 86 mg/dL (ref 70–99)
Potassium: 3.9 mmol/L (ref 3.5–5.1)
Sodium: 135 mmol/L (ref 135–145)
Total Bilirubin: 0.5 mg/dL (ref 0.3–1.2)
Total Protein: 6.7 g/dL (ref 6.5–8.1)

## 2023-02-20 LAB — GLUCOSE, CAPILLARY
Glucose-Capillary: 92 mg/dL (ref 70–99)
Glucose-Capillary: 91 mg/dL (ref 70–99)
Glucose-Capillary: 76 mg/dL (ref 70–99)
Glucose-Capillary: 88 mg/dL (ref 70–99)

## 2023-02-20 LAB — WET PREP, GENITAL
Clue Cells Wet Prep HPF POC: NONE SEEN
Sperm: NONE SEEN
Trich, Wet Prep: NONE SEEN
WBC, Wet Prep HPF POC: <10 (ref <10)
Yeast Wet Prep HPF POC: NONE SEEN

## 2023-02-20 LAB — TYPE AND SCREEN
ABO/RH(D): O POS
Antibody Screen: NEGATIVE

## 2023-02-20 LAB — GROUP B STREP BY PCR: Group B strep by PCR: NEGATIVE

## 2023-02-20 MED ORDER — LIDOCAINE HCL (PF) 1 % IJ SOLN: 30.0000 mL | INTRAMUSCULAR | Status: DC | PRN

## 2023-02-20 MED ORDER — ACETAMINOPHEN 325 MG PO TABS: 650.0000 mg | ORAL_TABLET | ORAL | Status: DC | PRN

## 2023-02-20 MED ORDER — ONDANSETRON HCL 4 MG/2ML IJ SOLN: 4.0000 mg | Freq: Four times a day (QID) | INTRAMUSCULAR | Status: DC | PRN

## 2023-02-20 MED ORDER — LACTATED RINGERS IV SOLN: 500.0000 mL | Freq: Once | INTRAVENOUS | Status: DC

## 2023-02-20 MED ORDER — LACTATED RINGERS IV SOLN: 500.0000 mL | INTRAVENOUS | Status: DC | PRN

## 2023-02-20 MED ORDER — TERBUTALINE SULFATE 1 MG/ML IJ SOLN: 0.2500 mg | Freq: Once | INTRAMUSCULAR | Status: DC | PRN

## 2023-02-20 MED ORDER — FENTANYL CITRATE (PF) 100 MCG/2ML IJ SOLN: 50.0000 ug | INTRAMUSCULAR | Status: DC | PRN

## 2023-02-20 MED ORDER — DIPHENHYDRAMINE HCL 50 MG/ML IJ SOLN: 12.5000 mg | INTRAMUSCULAR | Status: DC | PRN

## 2023-02-20 MED ORDER — LIDOCAINE HCL (PF) 1 % IJ SOLN
INTRAMUSCULAR | Status: DC | PRN
  Administered 2023-02-20: 5 mL via EPIDURAL
  Administered 2023-02-20: 4 mL via EPIDURAL

## 2023-02-20 MED ORDER — MISOPROSTOL 25 MCG QUARTER TABLET: 25.0000 ug | ORAL_TABLET | Freq: Once | ORAL | Status: DC

## 2023-02-20 MED ORDER — OXYCODONE-ACETAMINOPHEN 5-325 MG PO TABS: 2.0000 | ORAL_TABLET | ORAL | Status: DC | PRN

## 2023-02-20 MED ORDER — MISOPROSTOL 50MCG HALF TABLET: 50.0000 ug | ORAL_TABLET | Freq: Once | ORAL | Status: DC

## 2023-02-20 MED ORDER — HYDROXYZINE HCL 50 MG PO TABS: 50.0000 mg | ORAL_TABLET | Freq: Four times a day (QID) | ORAL | Status: DC | PRN

## 2023-02-20 MED ORDER — ZOLPIDEM TARTRATE 5 MG PO TABS: 5.0000 mg | ORAL_TABLET | Freq: Every evening | ORAL | Status: DC | PRN

## 2023-02-20 MED ORDER — EPHEDRINE 5 MG/ML INJ: 10.0000 mg | INTRAVENOUS | Status: DC | PRN

## 2023-02-20 MED ORDER — OXYTOCIN-SODIUM CHLORIDE 30-0.9 UT/500ML-% IV SOLN: 2.5000 [IU]/h | INTRAVENOUS | Status: DC

## 2023-02-20 MED ORDER — LACTATED RINGERS IV SOLN: INTRAVENOUS | Status: DC

## 2023-02-20 MED ORDER — OXYTOCIN-SODIUM CHLORIDE 30-0.9 UT/500ML-% IV SOLN
1.0000 m[IU]/min | INTRAVENOUS | Status: DC
Start: 2023-02-20 — End: 2023-02-21
  Administered 2023-02-20 (×2): 2 m[IU]/min via INTRAVENOUS
  Filled 2023-02-20: qty 500

## 2023-02-20 MED ORDER — PHENYLEPHRINE 80 MCG/ML (10ML) SYRINGE FOR IV PUSH (FOR BLOOD PRESSURE SUPPORT): 80.0000 ug | PREFILLED_SYRINGE | INTRAVENOUS | Status: DC | PRN

## 2023-02-20 MED ORDER — OXYCODONE-ACETAMINOPHEN 5-325 MG PO TABS: 1.0000 | ORAL_TABLET | ORAL | Status: DC | PRN

## 2023-02-20 MED ORDER — FENTANYL-BUPIVACAINE-NACL 0.5-0.125-0.9 MG/250ML-% EP SOLN
12.0000 mL/h | EPIDURAL | Status: DC | PRN
  Administered 2023-02-20: 12 mL/h via EPIDURAL
  Filled 2023-02-20: qty 250

## 2023-02-20 MED ORDER — PHENYLEPHRINE 80 MCG/ML (10ML) SYRINGE FOR IV PUSH (FOR BLOOD PRESSURE SUPPORT)
80.0000 ug | PREFILLED_SYRINGE | INTRAVENOUS | Status: DC | PRN
  Filled 2023-02-20: qty 10

## 2023-02-20 MED ORDER — SOD CITRATE-CITRIC ACID 500-334 MG/5ML PO SOLN
30.0000 mL | ORAL | Status: DC | PRN
Start: 2023-02-20 — End: 2023-02-21

## 2023-02-20 MED ORDER — FLEET ENEMA RE ENEM: 1.0000 | ENEMA | Freq: Every day | RECTAL | Status: DC | PRN

## 2023-02-20 MED ORDER — OXYTOCIN BOLUS FROM INFUSION
333.0000 mL | Freq: Once | INTRAVENOUS | Status: AC
  Administered 2023-02-21: 333 mL via INTRAVENOUS

## 2023-02-20 NOTE — Anesthesia Preprocedure Evaluation (Signed)
Anesthesia Evaluation  Patient identified by MRN, date of birth, ID band Patient awake    Reviewed: Allergy & Precautions, NPO status , Patient's Chart, lab work & pertinent test results  History of Anesthesia Complications Negative for: history of anesthetic complications  Airway Mallampati: II   Neck ROM: Full    Dental   Pulmonary former smoker   Pulmonary exam normal        Cardiovascular negative cardio ROS Normal cardiovascular exam     Neuro/Psych  Headaches  negative psych ROS   GI/Hepatic Neg liver ROS,GERD  Medicated,,  Endo/Other  diabetes, Gestational  Morbid obesity  Renal/GU negative Renal ROS     Musculoskeletal negative musculoskeletal ROS (+)    Abdominal  (+) + obese  Peds  Hematology  (+) Blood dyscrasia, anemia  Plt 246k    Anesthesia Other Findings   Reproductive/Obstetrics                             Anesthesia Physical Anesthesia Plan  ASA: 3  Anesthesia Plan: Epidural   Post-op Pain Management: Minimal or no pain anticipated   Induction:   PONV Risk Score and Plan: 2 and Treatment may vary due to age or medical condition  Airway Management Planned: Natural Airway  Additional Equipment: None  Intra-op Plan:   Post-operative Plan:   Informed Consent: I have reviewed the patients History and Physical, chart, labs and discussed the procedure including the risks, benefits and alternatives for the proposed anesthesia with the patient or authorized representative who has indicated his/her understanding and acceptance.       Plan Discussed with: Anesthesiologist  Anesthesia Plan Comments: (Labs reviewed. Platelets acceptable, patient not taking any blood thinning medications. Per RN, FHR tracing reported to be stable enough for sitting procedure. Risks and benefits discussed with patient, including PDPH, backache, epidural hematoma, failed epidural,  blood pressure changes, allergic reaction, and nerve injury. Patient expressed understanding and wished to proceed.)       Anesthesia Quick Evaluation

## 2023-02-20 NOTE — Progress Notes (Signed)
Cynthia Horne is a 32 y.o. G3P1011 at [redacted]w[redacted]d.  Subjective: Comfortable with epidural.  Objective: BP (!) 104/56   Pulse 97   Temp 97.9 F (36.6 C) (Oral)   Resp 19   Ht 5\' 5"  (1.651 m)   Wt (!) 147.3 kg   SpO2 100%   BMI 54.03 kg/m    FHT:  FHR: 135 bpm, variability: mod,  accelerations:  15x15,  decelerations:  variables, resolved UC:   Q 2-3 minutes, mod Dilation: 4.5 Effacement (%): 50 Station: -2 Presentation: Vertex Exam by:: Dorathy Kinsman, CNM IUPC placed w/out difficulty  Labs: Results for orders placed or performed during the hospital encounter of 02/20/23 (from the past 24 hour(s))  Glucose, capillary     Status: None   Collection Time: 02/20/23 11:17 AM  Result Value Ref Range   Glucose-Capillary 92 70 - 99 mg/dL  CBC     Status: Abnormal   Collection Time: 02/20/23 11:30 AM  Result Value Ref Range   WBC 11.8 (H) 4.0 - 10.5 K/uL   RBC 4.14 3.87 - 5.11 MIL/uL   Hemoglobin 10.4 (L) 12.0 - 15.0 g/dL   HCT 78.2 (L) 95.6 - 21.3 %   MCV 80.9 80.0 - 100.0 fL   MCH 25.1 (L) 26.0 - 34.0 pg   MCHC 31.0 30.0 - 36.0 g/dL   RDW 08.6 (H) 57.8 - 46.9 %   Platelets 246 150 - 400 K/uL   nRBC 0.2 0.0 - 0.2 %  RPR     Status: None   Collection Time: 02/20/23 11:30 AM  Result Value Ref Range   RPR Ser Ql NON REACTIVE NON REACTIVE  Comprehensive metabolic panel     Status: Abnormal   Collection Time: 02/20/23 11:30 AM  Result Value Ref Range   Sodium 135 135 - 145 mmol/L   Potassium 3.9 3.5 - 5.1 mmol/L   Chloride 106 98 - 111 mmol/L   CO2 21 (L) 22 - 32 mmol/L   Glucose, Bld 86 70 - 99 mg/dL   BUN 6 6 - 20 mg/dL   Creatinine, Ser 6.29 0.44 - 1.00 mg/dL   Calcium 52.8 (H) 8.9 - 10.3 mg/dL   Total Protein 6.7 6.5 - 8.1 g/dL   Albumin 2.7 (L) 3.5 - 5.0 g/dL   AST 16 15 - 41 U/L   ALT 10 0 - 44 U/L   Alkaline Phosphatase 179 (H) 38 - 126 U/L   Total Bilirubin 0.5 0.3 - 1.2 mg/dL   GFR, Estimated >41 >32 mL/min   Anion gap 8 5 - 15  Type and screen     Status:  None   Collection Time: 02/20/23 11:44 AM  Result Value Ref Range   ABO/RH(D) O POS    Antibody Screen NEG    Sample Expiration      02/23/2023,2359 Performed at [facility], [address] [place], [place] [address]   Group B strep by PCR     Status: None   Collection Time: 02/20/23 12:20 PM   Specimen: Vaginal/Rectal; Genital  Result Value Ref Range   Group B strep by PCR PRESUMPTIVE NEGATIVE PRESUMPTIVE NEGATIVE  Wet prep, genital     Status: None   Collection Time: 02/20/23  2:44 PM  Result Value Ref Range   Yeast Wet Prep HPF POC NONE SEEN NONE SEEN   Trich, Wet Prep NONE SEEN NONE SEEN   Clue Cells Wet Prep HPF POC NONE SEEN NONE SEEN   WBC, Wet  Prep HPF POC <10 <10   Sperm NONE SEEN   Glucose, capillary     Status: None   Collection Time: 02/20/23  2:51 PM  Result Value Ref Range   Glucose-Capillary 91 70 - 99 mg/dL  Glucose, capillary     Status: None   Collection Time: 02/20/23  6:41 PM  Result Value Ref Range   Glucose-Capillary 76 70 - 99 mg/dL    Assessment / Plan: [redacted]w[redacted]d week IUP ROM x Hours: 4 Minutes: 11 Labor: Early, inadequate contractions. IUPC placed. Restart pitocin.  Fetal Wellbeing:  Category I-II, variables likely 2/2 hypotension after epidural, resolved w/ correction of hypotension and D/C pitocin  Pain Control:  Epidural Anticipated MOD:  SVD A1GDM: Nml CBGs  [place], [place], CNM 02/20/2023 7:59 PM

## 2023-02-20 NOTE — Progress Notes (Signed)
Cynthia Horne is a 32 y.o. G3P1011 at [redacted]w[redacted]d.  Subjective: More uncomfortable. Coping well.   Objective: BP 131/76   Pulse (!) 102   Temp 97.7 F (36.5 C) (Oral)   Resp 16   Ht 5\' 5"  (1.651 m)   Wt (!) 147.3 kg   SpO2 100%   BMI 54.03 kg/m    FHT:  FHR: 140 bpm, variability: mod,  accelerations:  15x15,  decelerations:  none UC:   Q 3 minutes, mod Dilation: 3 Effacement (%): 50 Station: -3 Presentation: Vertex Exam by:: Ivonne Andrew, CNM AROM mod amount of clear fluid.   Labs: Results for orders placed or performed during the hospital encounter of 02/20/23 (from the past 24 hour(s))  Glucose, capillary     Status: None   Collection Time: 02/20/23 11:17 AM  Result Value Ref Range   Glucose-Capillary 92 70 - 99 mg/dL  CBC     Status: Abnormal   Collection Time: 02/20/23 11:30 AM  Result Value Ref Range   WBC 11.8 (H) 4.0 - 10.5 K/uL   RBC 4.14 3.87 - 5.11 MIL/uL   Hemoglobin 10.4 (L) 12.0 - 15.0 g/dL   HCT 16.1 (L) 09.6 - 04.5 %   MCV 80.9 80.0 - 100.0 fL   MCH 25.1 (L) 26.0 - 34.0 pg   MCHC 31.0 30.0 - 36.0 g/dL   RDW 40.9 (H) 81.1 - 91.4 %   Platelets 246 150 - 400 K/uL   nRBC 0.2 0.0 - 0.2 %  RPR     Status: None   Collection Time: 02/20/23 11:30 AM  Result Value Ref Range   RPR Ser Ql NON REACTIVE NON REACTIVE  Comprehensive metabolic panel     Status: Abnormal   Collection Time: 02/20/23 11:30 AM  Result Value Ref Range   Sodium 135 135 - 145 mmol/L   Potassium 3.9 3.5 - 5.1 mmol/L   Chloride 106 98 - 111 mmol/L   CO2 21 (L) 22 - 32 mmol/L   Glucose, Bld 86 70 - 99 mg/dL   BUN 6 6 - 20 mg/dL   Creatinine, Ser 7.82 0.44 - 1.00 mg/dL   Calcium 95.6 (H) 8.9 - 10.3 mg/dL   Total Protein 6.7 6.5 - 8.1 g/dL   Albumin 2.7 (L) 3.5 - 5.0 g/dL   AST 16 15 - 41 U/L   ALT 10 0 - 44 U/L   Alkaline Phosphatase 179 (H) 38 - 126 U/L   Total Bilirubin 0.5 0.3 - 1.2 mg/dL   GFR, Estimated >21 >30 mL/min   Anion gap 8 5 - 15  Type and screen     Status: None    Collection Time: 02/20/23 11:44 AM  Result Value Ref Range   ABO/RH(D) O POS    Antibody Screen NEG    Sample Expiration      02/23/2023,2359 Performed at [facility], [address] [place], [place] [address]   Group B strep by PCR     Status: None   Collection Time: 02/20/23 12:20 PM   Specimen: Vaginal/Rectal; Genital  Result Value Ref Range   Group B strep by PCR PRESUMPTIVE NEGATIVE PRESUMPTIVE NEGATIVE  Wet prep, genital     Status: None   Collection Time: 02/20/23  2:44 PM  Result Value Ref Range   Yeast Wet Prep HPF POC NONE SEEN NONE SEEN   Trich, Wet Prep NONE SEEN NONE SEEN   Clue Cells Wet Prep HPF POC NONE SEEN NONE SEEN  WBC, Wet Prep HPF POC <10 <10   Sperm NONE SEEN   Glucose, capillary     Status: None   Collection Time: 02/20/23  2:51 PM  Result Value Ref Range   Glucose-Capillary 91 70 - 99 mg/dL    Assessment / Plan: [redacted]w[redacted]d week IUP ROM x 0 hours Labor: Early, start pitocin Fetal Wellbeing:  Category I Pain Control:  Comfort measures. Plans epidural Anticipated MOD:  SVD  Dorathy Kinsman, [place] 02/20/2023 3:48 PM
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
## 2023-02-20 NOTE — Progress Notes (Signed)
Attempting to find FHR  difficult due to pt habitus and position of baby

## 2023-02-20 NOTE — Anesthesia Procedure Notes (Signed)
Epidural Patient location during procedure: OB Start time: 02/20/2023 5:07 PM End time: 02/20/2023 5:10 PM  Staffing Anesthesiologist: Beryle Lathe, MD Performed: anesthesiologist   Preanesthetic Checklist Completed: patient identified, IV checked, risks and benefits discussed, monitors and equipment checked, pre-op evaluation and timeout performed  Epidural Patient position: sitting Prep: DuraPrep Patient monitoring: continuous pulse ox and blood pressure Approach: midline Location: L3-L4 Injection technique: LOR saline  Needle:  Needle type: Tuohy  Needle gauge: 17 G Needle length: 9 cm Needle insertion depth: 9 cm Catheter size: 19 Gauge Catheter at skin depth: 14 cm Test dose: negative and Other (1% lidocaine)  Assessment Events: blood not aspirated and no cerebrospinal fluid  Additional Notes Patient identified. Risks including, but not limited to, bleeding, infection, nerve damage, paralysis, inadequate analgesia, blood pressure changes, nausea, vomiting, allergic reaction, postpartum back pain, itching, and headache were discussed. Patient expressed understanding and wished to proceed. Sterile prep and drape, including hand hygiene, mask, and sterile gloves were used. The patient was positioned and the spine was prepped. The skin was anesthetized with lidocaine. No paraesthesia or other complication noted. The patient did not experience any signs of intravascular injection such as tinnitus or metallic taste in mouth, nor signs of intrathecal spread such as rapid motor block. Please see nursing notes for vital signs. The patient tolerated the procedure well.   Leslye Peer, MDReason for block:procedure for pain

## 2023-02-20 NOTE — H&P (Addendum)
OBSTETRIC ADMISSION HISTORY AND PHYSICAL  Cynthia Horne is a 32 y.o. female G3P1011 with IUP at [redacted]w[redacted]d by first trimester [place] presenting for IOL indicated for GDM. She reports +FMs, No LOF, no VB, no blurry vision, headaches or peripheral edema, and RUQ pain.  She plans on Breast feeding. She request Depo bridge then BTL for birth control since she has not signed consent yet.. She received her prenatal care at [facility].  Lapse in care third trimester.   Dating: By First trimester [place] --->  Estimated Date of Delivery: 02/20/23  Sono:    @[redacted]w[redacted]d , CWD, normal anatomy, transverse presentation,  1007g, 74% EFW   Prenatal History/Complications:   Patient Active Problem List   Diagnosis Date Noted   Insufficient prenatal care 02/18/2023   Trichomonal vaginitis during pregnancy in second trimester -Treated adequately with Flagyl.   11/25/2022   GDM (gestational diabetes mellitus) -Well-controlled with diet.  Did not receive third trimester growth ultrasound due to lapse in care. 11/24/2022   Anemia -Venofer 200 mg x 2. 11/24/2022   ASCUS of cervix with negative high risk HPV 09/13/2022   Supervision of high-risk pregnancy 08/31/2022   Obesity affecting pregnancy, antepartum 10/08/2015     Past Medical History: Past Medical History:  Diagnosis Date   Anemia    Chronic headache 11/23/2022   [ ]  f/u neurology recs 8/28     GDM (gestational diabetes mellitus) 11/2022   Migraine    Migraine headache with aura 11/05/2015   - Rx for Fioricet (use for Migraine)  - recommend Tylenol, increased H2O intake and frequent meals and snack for occ. HA   Ovarian cyst    Trichomonal vaginitis in pregnancy 11/25/2022   Dx 27w  [ ]  TOC 39wk or PP      Past Surgical History: Past Surgical History:  Procedure Laterality Date   OOPHORECTOMY Right 2005   ORIF ANKLE FRACTURE Right 12/31/2020   Procedure: OPEN REDUCTION INTERNAL FIXATION (ORIF) RIGHT ANKLE FRACTURE;  Surgeon: Huel Cote, MD;  Location:  [facility] OR;  Service: Orthopedics;  Laterality: Right;    Obstetrical History: OB History     Gravida  3   Para  1   Term  1   Preterm      AB  1   Living  1      SAB  1   IAB      Ectopic      Multiple  0   Live Births  1           Social History Social History   Socioeconomic History   Marital status: Single    Spouse name: Not on file   Number of children: 1   Years of education: Not on file   Highest education level: Some college, no degree  Occupational History   Occupation: Company secretary  Tobacco Use   Smoking status: Former    Current packs/day: 0.50    Types: Cigarettes, Cigars   Smokeless tobacco: Former   Tobacco comments:    quit with preg  Vaping Use   Vaping status: Never Used  Substance and Sexual Activity   Alcohol use: No   Drug use: No   Sexual activity: Yes    Partners: Male    Birth control/protection: None  Other Topics Concern   Not on file  Social History Narrative   Patient is right-handed.She lives in a one story house.    She drinks 2 cups of coffee a day, and a  glass of tea or soda  QOD.    She walks extensively at work.   Social Determinants of Health   Financial Resource Strain: Not at Risk (07/14/2022)   Received from [place], [place]   [employer]    Financial Resource Strain: 1  Food Insecurity: No Food Insecurity (02/20/2023)   Hunger Vital Sign    Worried About Running Out of Food in the Last Year: Never true    Ran Out of Food in the Last Year: Never true  Transportation Needs: No Transportation Needs (02/20/2023)   PRAPARE - Administrator, Civil Service (Medical): No    Lack of Transportation (Non-Medical): No  Physical Activity: Not on File (08/13/2021)   Received from [place], [place]   Physical Activity    Physical Activity: 0  Stress: Not on File (08/13/2021)   Received from [facility], [place]   Stress    Stress: 0  Social Connections: Not on File (01/03/2023)   Received from [facility]    Social Connections    Connectedness: 0    Family History: Family History  Problem Relation Age of Onset   Migraines Maternal Grandmother    Hypertension Maternal Grandmother    Cancer Maternal Grandfather        pancreatic    Allergies: No Known Allergies  Medications Prior to Admission  Medication Sig Dispense Refill Last Dose   Accu-Chek Softclix Lancets lancets Use 1 lancet to check glucose 4 times daily. 100 each 5    aspirin EC 81 MG tablet Take 1 tablet (81 mg total) by mouth daily. Swallow whole. 30 tablet 12    Blood Glucose Monitoring Suppl (ACCU-CHEK GUIDE ME) w/Device KIT Use glucose meter to check glucose 4 times daily 1 kit 0    cetirizine (ZYRTEC) 10 MG tablet Take 10 mg by mouth every other day.      Doxylamine-Pyridoxine (DICLEGIS PO) Take 10 mg by mouth at bedtime.      ferrous sulfate 324 (65 Fe) MG TBEC Take 1 tablet (325 mg total) by mouth every other day. 30 tablet 2    glucose blood (ACCU-CHEK GUIDE) test strip Use 1 test strip to check glucose 4 times daily 100 each 5    omeprazole (PRILOSEC OTC) 20 MG tablet Take 1 tablet (20 mg total) by mouth daily. 90 tablet 1    ondansetron (ZOFRAN-ODT) 4 MG disintegrating tablet Take one tablet 30 minutes prior to medication. 10 tablet 0    Prenatal Vit-Fe Fumarate-FA (MULTIVITAMIN-PRENATAL) 27-0.8 MG TABS tablet Take 1 tablet by mouth daily at 12 noon.        Review of Systems   All systems reviewed and negative except as stated in HPI  Blood pressure 119/76, pulse (!) 105, temperature 97.8 F (36.6 C), temperature source Axillary, resp. rate 16, height 5\' 5"  (1.651 m), weight (!) 147.3 kg, SpO2 100%, unknown if currently breastfeeding. General appearance: alert, cooperative, and no distress Lungs: clear to auscultation bilaterally Heart: regular rate and rhythm Abdomen: soft, non-tender; bowel sounds normal  Extremities: Homans sign is negative, no sign of DVT Presentation: cephalic Fetal  monitoringBaseline: 130 bpm, Variability: Good {> 6 bpm), Accelerations: Reactive, and Decelerations: Absent Uterine activity irregular contractions Dilation: 3 Station: Ballotable Exam by:: [place], CNM   Prenatal labs: ABO, Rh: --/--/O POS (10/27 1144) Antibody: NEG (10/27 1144) Rubella: 2.72 (05/13 1443) RPR: Non Reactive (07/30 0854)  HBsAg: Negative (05/13 1443)  HIV: Non Reactive (07/30 0854)  GBS:   Negative PCR  Prenatal Transfer Tool  Maternal Diabetes: Yes:  Diabetes Type:  Diet controlled Genetic Screening: Normal Maternal Ultrasounds/Referrals: Normal Fetal Ultrasounds or other Referrals:  None Maternal Substance Abuse:  No Significant Maternal Medications:  None Significant Maternal Lab Results:  GBS negative PCR Number of Prenatal Visits:greater than 3 verified prenatal visits  Results for orders placed or performed during the hospital encounter of 02/20/23 (from the past 24 hour(s))  Glucose, capillary   Collection Time: 02/20/23 11:17 AM  Result Value Ref Range   Glucose-Capillary 92 70 - 99 mg/dL  CBC   Collection Time: 02/20/23 11:30 AM  Result Value Ref Range   WBC 11.8 (H) 4.0 - 10.5 K/uL   RBC 4.14 3.87 - 5.11 MIL/uL   Hemoglobin 10.4 (L) 12.0 - 15.0 g/dL   HCT 09.8 (L) 11.9 - 14.7 %   MCV 80.9 80.0 - 100.0 fL   MCH 25.1 (L) 26.0 - 34.0 pg   MCHC 31.0 30.0 - 36.0 g/dL   RDW 82.9 (H) 56.2 - 13.0 %   Platelets 246 150 - 400 K/uL   nRBC 0.2 0.0 - 0.2 %  RPR   Collection Time: 02/20/23 11:30 AM  Result Value Ref Range   RPR Ser Ql NON REACTIVE NON REACTIVE  Comprehensive metabolic panel   Collection Time: 02/20/23 11:30 AM  Result Value Ref Range   Sodium 135 135 - 145 mmol/L   Potassium 3.9 3.5 - 5.1 mmol/L   Chloride 106 98 - 111 mmol/L   CO2 21 (L) 22 - 32 mmol/L   Glucose, Bld 86 70 - 99 mg/dL   BUN 6 6 - 20 mg/dL   Creatinine, Ser 8.65 0.44 - 1.00 mg/dL   Calcium 78.4 (H) 8.9 - 10.3 mg/dL   Total Protein 6.7 6.5 - 8.1 g/dL    Albumin 2.7 (L) 3.5 - 5.0 g/dL   AST 16 15 - 41 U/L   ALT 10 0 - 44 U/L   Alkaline Phosphatase 179 (H) 38 - 126 U/L   Total Bilirubin 0.5 0.3 - 1.2 mg/dL   GFR, Estimated >69 >62 mL/min   Anion gap 8 5 - 15  Type and screen   Collection Time: 02/20/23 11:44 AM  Result Value Ref Range   ABO/RH(D) O POS    Antibody Screen NEG    Sample Expiration      02/23/2023,2359 Performed at [facility], [address], [place], [place] [address]   Group B strep by PCR   Collection Time: 02/20/23 12:20 PM   Specimen: Vaginal/Rectal; Genital  Result Value Ref Range   Group B strep by PCR PRESUMPTIVE NEGATIVE PRESUMPTIVE NEGATIVE  Wet prep, genital   Collection Time: 02/20/23  2:44 PM  Result Value Ref Range   Yeast Wet Prep HPF POC NONE SEEN NONE SEEN   Trich, Wet Prep NONE SEEN NONE SEEN   Clue Cells Wet Prep HPF POC NONE SEEN NONE SEEN   WBC, Wet Prep HPF POC <10 <10   Sperm NONE SEEN   Glucose, capillary   Collection Time: 02/20/23  2:51 PM  Result Value Ref Range   Glucose-Capillary 91 70 - 99 mg/dL    Patient Active Problem List   Diagnosis Date Noted   Insufficient prenatal care 02/18/2023   Trichomonal vaginitis during pregnancy in second trimester 11/25/2022   GDM (gestational diabetes mellitus) 11/24/2022   Anemia 11/24/2022   ASCUS of cervix with negative high risk HPV 09/13/2022   Supervision of high-risk pregnancy 08/31/2022  Obesity affecting pregnancy, antepartum 10/08/2015    Assessment/Plan:  Loel Ro is a 32 y.o. G3P1011 at [redacted]w[redacted]d here for IOL indicated for GDM  #Labor: Currently 3cm with irregular contractions. Discussed augmentation with pitocin 2x2. Will offer AROM as able. Expect cervical change to NSVD.  #Trichomonas in second trimester - Completed abx course without TOC, currently pending.  #GDM - Well controlled per patient report. No BG log available for review. Denies shoulder dystocia with prior pregnancy. Monitor CBG Q4H during latent phase  and Q2H during active labor. Monitor fasting CBG in postpartum period.  #Pain: Per patient request #FWB: Cat I tracing #ID:  GBS unknown #MOF: breast #MOC: Depo bridge to BTL.  Needs to sign consent in office soon as possible after delivery.  This patient's plan of care has been discussed with Dorathy Kinsman, CNM. Please see attestation.    Charma Igo, MD  02/20/2023, 11:13 AM  I performed the exam and agree with above.  Katrinka Blazing, [place] 02/20/2023 3:31 PM

## 2023-02-21 ENCOUNTER — Encounter (HOSPITAL_COMMUNITY): Payer: Self-pay | Admitting: Obstetrics & Gynecology

## 2023-02-21 DIAGNOSIS — O24420 Gestational diabetes mellitus in childbirth, diet controlled: Secondary | ICD-10-CM

## 2023-02-21 DIAGNOSIS — O9832 Other infections with a predominantly sexual mode of transmission complicating childbirth: Secondary | ICD-10-CM

## 2023-02-21 DIAGNOSIS — Z3A40 40 weeks gestation of pregnancy: Secondary | ICD-10-CM

## 2023-02-21 DIAGNOSIS — O480 Post-term pregnancy: Secondary | ICD-10-CM

## 2023-02-21 DIAGNOSIS — O99214 Obesity complicating childbirth: Secondary | ICD-10-CM

## 2023-02-21 LAB — GLUCOSE, CAPILLARY: Glucose-Capillary: 99 mg/dL (ref 70–99)

## 2023-02-21 MED ORDER — SODIUM CHLORIDE 0.9% FLUSH: 3.0000 mL | Freq: Two times a day (BID) | INTRAVENOUS | Status: DC

## 2023-02-21 MED ORDER — MEASLES, MUMPS & RUBELLA VAC IJ SOLR: 0.5000 mL | Freq: Once | INTRAMUSCULAR | Status: DC

## 2023-02-21 MED ORDER — SIMETHICONE 80 MG PO CHEW: 80.0000 mg | CHEWABLE_TABLET | ORAL | Status: DC | PRN

## 2023-02-21 MED ORDER — ZOLPIDEM TARTRATE 5 MG PO TABS: 5.0000 mg | ORAL_TABLET | Freq: Every evening | ORAL | Status: DC | PRN

## 2023-02-21 MED ORDER — SENNOSIDES-DOCUSATE SODIUM 8.6-50 MG PO TABS
2.0000 | ORAL_TABLET | ORAL | Status: DC
  Administered 2023-02-21 – 2023-02-22 (×2): 2 via ORAL
  Filled 2023-02-21 (×2): qty 2

## 2023-02-21 MED ORDER — DIBUCAINE (PERIANAL) 1 % EX OINT: 1.0000 | TOPICAL_OINTMENT | CUTANEOUS | Status: DC | PRN

## 2023-02-21 MED ORDER — ONDANSETRON HCL 4 MG PO TABS: 4.0000 mg | ORAL_TABLET | ORAL | Status: DC | PRN

## 2023-02-21 MED ORDER — SODIUM CHLORIDE 0.9% FLUSH: 3.0000 mL | INTRAVENOUS | Status: DC | PRN

## 2023-02-21 MED ORDER — ACETAMINOPHEN 325 MG PO TABS
650.0000 mg | ORAL_TABLET | ORAL | Status: DC | PRN
  Administered 2023-02-22: 650 mg via ORAL
  Filled 2023-02-21: qty 2

## 2023-02-21 MED ORDER — TETANUS-DIPHTH-ACELL PERTUSSIS 5-2.5-18.5 LF-MCG/0.5 IM SUSY: 0.5000 mL | PREFILLED_SYRINGE | Freq: Once | INTRAMUSCULAR | Status: DC

## 2023-02-21 MED ORDER — BENZOCAINE-MENTHOL 20-0.5 % EX AERO
1.0000 | INHALATION_SPRAY | CUTANEOUS | Status: DC | PRN
Start: 2023-02-21 — End: 2023-02-22

## 2023-02-21 MED ORDER — COCONUT OIL OIL
1.0000 | TOPICAL_OIL | Status: DC | PRN
Start: 2023-02-21 — End: 2023-02-22

## 2023-02-21 MED ORDER — ONDANSETRON HCL 4 MG/2ML IJ SOLN: 4.0000 mg | INTRAMUSCULAR | Status: DC | PRN

## 2023-02-21 MED ORDER — PRENATAL MULTIVITAMIN CH
1.0000 | ORAL_TABLET | Freq: Every day | ORAL | Status: DC
  Administered 2023-02-21: 1 via ORAL
  Filled 2023-02-21: qty 1

## 2023-02-21 MED ORDER — DIPHENHYDRAMINE HCL 25 MG PO CAPS: 25.0000 mg | ORAL_CAPSULE | Freq: Four times a day (QID) | ORAL | Status: DC | PRN

## 2023-02-21 MED ORDER — WITCH HAZEL-GLYCERIN EX PADS: 1.0000 | MEDICATED_PAD | CUTANEOUS | Status: DC | PRN

## 2023-02-21 MED ORDER — IBUPROFEN 600 MG PO TABS
600.0000 mg | ORAL_TABLET | Freq: Four times a day (QID) | ORAL | Status: DC
  Administered 2023-02-21 – 2023-02-22 (×5): 600 mg via ORAL
  Filled 2023-02-21 (×5): qty 1

## 2023-02-21 MED ORDER — SODIUM CHLORIDE 0.9% FLUSH: 10.0000 mL | Freq: Two times a day (BID) | INTRAVENOUS | Status: DC

## 2023-02-21 NOTE — Lactation Note (Signed)
This note was copied from a baby's chart. Lactation Consultation Note  Patient Name: Cynthia Horne [id number] Date: 02/21/2023 Age:30 hours, P2, exp BF  Reason for consult: Initial assessment;Maternal endocrine disorder;Term;Breastfeeding assistance Per mom just fed the baby 5 mins at 9 am.  Baby woke up , LC and orientee offered to assist and mom receptive.  Orientee checked the diaper, dry. Orientee assisted with review of hand express , small drops and then assisted to latch on the right breast , football , swallows noted and increased with warm moist heat compress.  Baby fed 17 mins. LC and orientee noted the baby to be jittery for short interval, and not noted after baby fed. Per mom comfortable.   Maternal Data - GDM - diet  Does the patient have breastfeeding experience prior to this delivery?: Yes How long did the patient breastfeed?: per mom BF her 1st baby 1 year and 6 months  Feeding Mother's Current Feeding Choice: Breast Milk  LATCH Score Latch: Grasps breast easily, tongue down, lips flanged, rhythmical sucking.  Audible Swallowing: A few with stimulation (warm moist compress applied to the breast while the feeding increased swallows noted and went from 1-2)  Type of Nipple: Everted at rest and after stimulation  Comfort (Breast/Nipple): Soft / non-tender  Hold (Positioning): Assistance needed to correctly position infant at breast and maintain latch.  LATCH Score: 8   Lactation Tools Discussed/Used  None needed   Interventions Interventions: Breast feeding basics reviewed;Assisted with latch;Skin to skin;Hand express;Breast compression;Adjust position;Support pillows;Education;LC Services brochure  Discharge Pump: Personal;DEBP  Consult Status Consult Status: Follow-up Date: 02/22/23 Follow-up type: In-patient    Cynthia Horne Cynthia Horne 02/21/2023, 9:44 AM

## 2023-02-21 NOTE — Discharge Summary (Signed)
Postpartum Discharge Summary  Date of Service updated***     Patient Name: Cynthia Horne DOB: [birth year] MRN: [id number]  Date of admission: 02/20/2023 Delivery date:02/21/2023 Delivering provider: Wyn Forster Date of discharge: 02/21/2023  Admitting diagnosis: GDM (gestational diabetes mellitus) [O24.419] Intrauterine pregnancy: [redacted]w[redacted]d     Secondary diagnosis:  Principal Problem:   GDM (gestational diabetes mellitus) Active Problems:   Obesity affecting pregnancy, antepartum   Supervision of high-risk pregnancy   Anemia   Trichomonal vaginitis during pregnancy in second trimester   Insufficient prenatal care   NSVD (normal spontaneous vaginal delivery)  Additional problems: ***    Discharge diagnosis: Term Pregnancy Delivered                                              Post partum procedures:{Postpartum procedures:23558} Augmentation: AROM and Pitocin Complications: None  Hospital course: Induction of Labor With Vaginal Delivery   32 y.o. yo G3P1011 at [redacted]w[redacted]d was admitted to the hospital 02/20/2023 for induction of labor.  Indication for induction: A1 DM.  Patient had an labor course complicated by recurrent late decels after epidural improved with pitocin break, continued intermittent maternal hypotension (asymptomatic).  Membrane Rupture Time/Date: 3:48 PM,02/20/2023  Delivery Method:Vaginal, Spontaneous Operative Delivery:N/A Episiotomy: None Lacerations:  None Details of delivery can be found in separate delivery note.  Patient had a postpartum course complicated by***. Patient is discharged home 02/21/23.  Newborn Data: Birth date:02/21/2023 Birth time:2:42 AM Gender:Female Living status:Living Apgars:9 ,9  Weight:   Magnesium Sulfate received: {Mag received:30440022} BMZ received: No Rhophylac:N/A MMR:Yes T-DaP:{Tdap:23962} Flu: {WUJ:81191} RSV Vaccine received: {RSV:31013} Transfusion:{Transfusion received:30440034}  Immunizations  received: Immunization History  Administered Date(s) Administered   Pneumococcal Polysaccharide-23 04/04/2016   Tdap 01/23/2016    Physical exam  Vitals:   02/21/23 0000 02/21/23 0032 02/21/23 0040 02/21/23 0130  BP: 117/77 94/65 (!) 97/56 (!) 109/43  Pulse: (!) 115 (!) 128 (!) 105 76  Resp:      Temp:      TempSrc:      SpO2:  99% 100%   Weight:      Height:       General: {Exam; general:21111117} Lochia: {Desc; appropriate/inappropriate:30686::"appropriate"} Uterine Fundus: {Desc; firm/soft:30687} Incision: {Exam; incision:21111123} DVT Evaluation: {Exam; dvt:2111122} Labs: Lab Results  Component Value Date   WBC 11.8 (H) 02/20/2023   HGB 10.4 (L) 02/20/2023   HCT 33.5 (L) 02/20/2023   MCV 80.9 02/20/2023   PLT 246 02/20/2023      Latest Ref Rng & Units 02/20/2023   11:30 AM  CMP  Glucose 70 - 99 mg/dL 86   BUN 6 - 20 mg/dL 6   Creatinine 4.78 - 2.95 mg/dL 6.21   Sodium 308 - 657 mmol/L 135   Potassium 3.5 - 5.1 mmol/L 3.9   Chloride 98 - 111 mmol/L 106   CO2 22 - 32 mmol/L 21   Calcium 8.9 - 10.3 mg/dL 84.6   Total Protein 6.5 - 8.1 g/dL 6.7   Total Bilirubin 0.3 - 1.2 mg/dL 0.5   Alkaline Phos 38 - 126 U/L 179   AST 15 - 41 U/L 16   ALT 0 - 44 U/L 10    Edinburgh Score:     No data to display         No data recorded  After visit meds:  Allergies as of 02/21/2023  No Known Allergies   Med Rec must be completed prior to using this [facility]***        Discharge home in stable condition Infant Feeding: Bottle and Breast Infant Disposition:{CHL IP OB HOME WITH ZOXWRU:04540} Discharge instruction: per After Visit Summary and Postpartum booklet. Activity: Advance as tolerated. Pelvic rest for 6 weeks.  Diet: {OB JWJX:91478295} Future Appointments:No future appointments. Follow up Visit:  Follow-up Information     [facility] Follow up in 6 week(s).   Specialty: Obstetrics and Gynecology Contact information: [address] [place] [address] [phone]               Message sent to [facility] 10/28  Please schedule this patient for a In person postpartum visit in 6 weeks with the following provider: Any provider. Additional Postpartum F/U:Postpartum Depression checkup  High risk pregnancy complicated by: GDM Delivery mode:  Vaginal, Spontaneous Anticipated Birth Control:  Plans Interval BTL   02/21/2023 Wyn Forster, MD

## 2023-02-21 NOTE — Progress Notes (Addendum)
LABOR PROGRESS NOTE  Cynthia Horne is a 32 y.o. G3P1011 at [redacted]w[redacted]d  presented for IOL 2/2 GDM.  Subjective: Now feeling more pressure and pain, though not yet constant.  FOB at bedside, amicable.  Objective: BP (!) 97/56   Pulse (!) 105   Temp 97.9 F (36.6 C) (Oral)   Resp 18   Ht 5\' 5"  (1.651 m)   Wt (!) 147.3 kg   SpO2 100%   BMI 54.03 kg/m  Vitals:   02/20/23 2330 02/21/23 0000 02/21/23 0032 02/21/23 0040  BP: 120/72 117/77 94/65 (!) 97/56  Pulse: (!) 105 (!) 115 (!) 128 (!) 105  Resp:      Temp:      TempSrc:      SpO2:   99% 100%  Weight:      Height:       Physical Exam Dilation: 8.5 Effacement (%): 90 Station: 0 Presentation: Vertex Exam by:: Dr. Sharion Dove Fetal monitoring: Baseline 140/Moderate variability/10x10 accels/Variable decels with good recovery Uterine activity: Every 2-3 minutes  Labs: Lab Results  Component Value Date   WBC 11.8 (H) 02/20/2023   HGB 10.4 (L) 02/20/2023   HCT 33.5 (L) 02/20/2023   MCV 80.9 02/20/2023   PLT 246 02/20/2023    Patient Active Problem List   Diagnosis Date Noted   Insufficient prenatal care 02/18/2023   Trichomonal vaginitis during pregnancy in second trimester 11/25/2022   GDM (gestational diabetes mellitus) 11/24/2022   Anemia 11/24/2022   ASCUS of cervix with negative high risk HPV 09/13/2022   Supervision of high-risk pregnancy 08/31/2022   Obesity affecting pregnancy, antepartum 10/08/2015    Assessment / Plan: 32 y.o. G3P1011 at [redacted]w[redacted]d here for IOL 2/2 GDM.  Labor: Continue pitocin, contractions still adequate.  Making good cervical change.  Given late decels will 1/2 pitocin from 8 to 4u.  Fetal Wellbeing:  Cat II, repositioning given variable decels. Pain Control:  Epidural Anticipated MOD:  Vaginal #GBS negative  #GDM: CBGs remain at goal. #Hypotension: BPs still soft, patient remains asymptomatic.  Now on 125 mL/hr IVF with intermittent variable decels.  CTM.  Dimitry Shitarev [facility]  PGY-1 02/21/23 1:19 AM  GME ATTESTATION:  Evaluation and management procedures were performed by the [facility] Medicine Resident under my supervision. I was immediately available for direct supervision, assistance and direction throughout this encounter.  I also confirm that I have verified the information documented in the resident's note, and that I have also personally reperformed the pertinent components of the physical exam and all of the medical decision making activities.  I have also made any necessary editorial changes.  Wyn Forster, MD OB Fellow, Faculty Practice [place] [facility], [facility] 02/21/2023 1:56 AM

## 2023-02-21 NOTE — Anesthesia Postprocedure Evaluation (Signed)
Anesthesia Post Note  Patient: Cynthia Horne  Procedure(s) Performed: AN AD HOC LABOR EPIDURAL     Patient location during evaluation: Mother Baby Anesthesia Type: Epidural Level of consciousness: awake and alert and oriented Pain management: satisfactory to patient Vital Signs Assessment: post-procedure vital signs reviewed and stable Respiratory status: respiratory function stable Cardiovascular status: stable Postop Assessment: no headache, no backache, epidural receding, patient able to bend at knees, no signs of nausea or vomiting, adequate PO intake and able to ambulate Anesthetic complications: no   No notable events documented.  Last Vitals:  Vitals:   02/21/23 0456 02/21/23 0555  BP: 122/64 131/67  Pulse: (!) 110 (!) 113  Resp: 16 18  Temp: 36.5 C 36.5 C  SpO2: 99% 100%    Last Pain:  Vitals:   02/21/23 0734  TempSrc:   PainSc: Asleep   Pain Goal:                   Karleen Dolphin

## 2023-02-22 LAB — GLUCOSE, CAPILLARY: Glucose-Capillary: 91 mg/dL (ref 70–99)

## 2023-02-22 MED ORDER — SENNOSIDES-DOCUSATE SODIUM 8.6-50 MG PO TABS: 2.0000 | ORAL_TABLET | ORAL | 0 refills | Status: AC

## 2023-02-22 MED ORDER — ACETAMINOPHEN 325 MG PO TABS: 650.0000 mg | ORAL_TABLET | Freq: Four times a day (QID) | ORAL | 0 refills | Status: AC | PRN

## 2023-02-22 MED ORDER — MEDROXYPROGESTERONE ACETATE 150 MG/ML IM SUSP
150.0000 mg | Freq: Once | INTRAMUSCULAR | Status: DC
  Filled 2023-02-22: qty 1

## 2023-02-22 MED ORDER — IBUPROFEN 600 MG PO TABS: 600.0000 mg | ORAL_TABLET | Freq: Four times a day (QID) | ORAL | 0 refills | Status: AC

## 2023-02-22 NOTE — Progress Notes (Incomplete)
POSTPARTUM PROGRESS NOTE  Subjective: Cynthia Horne is a 32 y.o. W4X3244 s/p SVD at [redacted]w[redacted]d.  She reports she is doing well. No acute events overnight. She denies any problems with ambulating, voiding or po intake. Denies nausea or vomiting. She {Blank single:19197::"has","has not"} passed flatus. She {has/has not had:19237} bowel movement. Pain is {DESC; WELL/MODERATELY/POORLY:30679} controlled.  Lochia is {Desc; minimal/small/moderate/large/very large:110034::"Minimal"}.  *** If preeclamptic: +/- headache, scotomata, and RUQ pain *** If PPH: +/- dizziness, dyspnea, and chest palpitation  Objective: BP (!) 128/56 (BP Location: Left Arm) Comment: RN Notified  Pulse 100   Temp 98.2 F (36.8 C) (Oral)   Resp 18   Ht 5\' 5"  (1.651 m)   Wt (!) 147.3 kg   SpO2 99%   Breastfeeding Unknown   BMI 54.03 kg/m   Physical Exam:  General: alert, cooperative and no distress Chest: CTAB, no respiratory distress Abdomen: soft, non-tender  Uterine Fundus: firm, appropriately tender Extremities: No calf swelling, tenderness, or edema  Recent Labs    02/20/23 1130  HGB 10.4*  HCT 33.5*    Assessment/Plan: Cynthia Horne is a 32 y.o. W1U2725 s/p SVD at [redacted]w[redacted]d.   LOS: 2 days   PPD#1: Doing well, pain well-controlled.  -- Routine postpartum care, lactation support -- Encouraged up OOB -- Contraception: Depo-Provera>Plans Interval BTL -- Feeding: breast feeding -- Circumcision: no  -- GDM: PP CBG normal***.  Follow up with 2 hr GTT at next [facility] appointment.  Dispo: Plan for discharge tomorrow.  Cynthia Horne Cynthia Dove, MD [facility] PGY-1 OB Faculty Practice 02/22/23 5:16 AM

## 2023-03-10 ENCOUNTER — Telehealth (HOSPITAL_COMMUNITY): Payer: Self-pay | Admitting: *Deleted

## 2023-03-10 NOTE — Telephone Encounter (Signed)
03/10/2023  Name: Cynthia Horne MRN: [id number] DOB: [birth year]  Reason for Call:  Transition of Care Hospital Discharge Call  Contact Status: Patient Contact Status: Complete  Language assistant needed: Interpreter Mode: Interpreter Not Needed        Follow-Up Questions: Do You Have Any Concerns About Your Health As You Heal From Delivery?: No Do You Have Any Concerns About Your Infants Health?: No  Edinburgh Postnatal Depression Scale:  In the Past 7 Days:    PHQ2-9 Depression Scale:     Discharge Follow-up: Edinburgh score requires follow up?: N/A (patient declined completing) Patient was advised of the following resources::  (patient stated she would call back if she needed any resources; patient stated she was at work)  Post-discharge interventions: Reviewed Newborn Safe Sleep Practices  Maudry Diego, RN 03/10/2023 1304

## 2023-04-06 ENCOUNTER — Ambulatory Visit: Payer: BLUE CROSS/BLUE SHIELD

## 2023-04-06 ENCOUNTER — Other Ambulatory Visit: Payer: BLUE CROSS/BLUE SHIELD

## 2023-04-11 ENCOUNTER — Other Ambulatory Visit: Payer: BLUE CROSS/BLUE SHIELD

## 2023-04-11 ENCOUNTER — Ambulatory Visit: Payer: BLUE CROSS/BLUE SHIELD | Admitting: Certified Nurse Midwife

## 2023-04-25 ENCOUNTER — Ambulatory Visit: Payer: BLUE CROSS/BLUE SHIELD | Admitting: Licensed Clinical Social Worker

## 2023-04-25 NOTE — BH Specialist Note (Signed)
Patient no showed for today's visit. Virtual link sent to patient's phone and a phone call was provided. A VM was left.

## 2023-05-20 ENCOUNTER — Other Ambulatory Visit: Payer: BLUE CROSS/BLUE SHIELD

## 2023-05-20 ENCOUNTER — Other Ambulatory Visit (HOSPITAL_COMMUNITY)
Admission: RE | Admit: 2023-05-20 | Discharge: 2023-05-20 | Disposition: A | Discharge status: Home / Self Care | Payer: BLUE CROSS/BLUE SHIELD | Source: Ambulatory Visit | Attending: Certified Nurse Midwife | Admitting: Certified Nurse Midwife

## 2023-05-20 ENCOUNTER — Encounter: Payer: Self-pay | Admitting: Certified Nurse Midwife

## 2023-05-20 ENCOUNTER — Ambulatory Visit (INDEPENDENT_AMBULATORY_CARE_PROVIDER_SITE_OTHER): Payer: BLUE CROSS/BLUE SHIELD | Admitting: Certified Nurse Midwife

## 2023-05-20 DIAGNOSIS — Z113 Encounter for screening for infections with a predominantly sexual mode of transmission: Secondary | ICD-10-CM | POA: Insufficient documentation

## 2023-05-20 DIAGNOSIS — Z3009 Encounter for other general counseling and advice on contraception: Secondary | ICD-10-CM

## 2023-05-20 NOTE — Progress Notes (Signed)
Post Partum Visit Note  Cynthia Horne is a 33 y.o. G49P2012 female who presents for a postpartum visit. She is  12  weeks postpartum following a normal spontaneous vaginal delivery.  I have fully reviewed the prenatal and intrapartum course. The delivery was at 40 gestational weeks.  Anesthesia: epidural. Postpartum course has been good. Baby is doing well. Baby is feeding by bottle - Enfamil Nutramigen. Bleeding no bleeding. Bowel function is normal. Bladder function is normal. Patient is sexually active. Contraception method is none. Postpartum depression screening: negative.   The pregnancy intention screening data noted above was reviewed. Potential methods of contraception were discussed. The patient elected to proceed with No data recorded.   Edinburgh Postnatal Depression Scale - 05/20/23 0838       Edinburgh Postnatal Depression Scale:  In the Past 7 Days   I have been able to laugh and see the funny side of things. 0    I have looked forward with enjoyment to things. 0    I have blamed myself unnecessarily when things went wrong. 2    I have been anxious or worried for no good reason. 2    I have felt scared or panicky for no good reason. 2    Things have been getting on top of me. 0    I have been so unhappy that I have had difficulty sleeping. 0    I have felt sad or miserable. 1    I have been so unhappy that I have been crying. 1    The thought of harming myself has occurred to me. 0    Edinburgh Postnatal Depression Scale Total 8             Health Maintenance Due  Topic Date Due   Pneumococcal Vaccine 65-13 Years old (2 of 2 - PCV) 04/04/2017   INFLUENZA VACCINE  Never done   COVID-19 Vaccine (1 - 2024-25 season) Never done    The following portions of the patient's history were reviewed and updated as appropriate: allergies, current medications, past family history, past medical history, past social history, past surgical history, and problem list.  Review  of Systems Pertinent items noted in HPI and remainder of comprehensive ROS otherwise negative.  Objective:  BP 127/89   Pulse 94   Ht 5\' 5"  (1.651 m)   Wt 297 lb (134.7 kg)   Breastfeeding No   BMI 49.42 kg/m    General:  alert, cooperative, and appears stated age   Breasts:  not indicated  Lungs: clear to auscultation bilaterally  Heart:  regular rate and rhythm, S1, S2 normal, no murmur, click, rub or gallop  Abdomen: soft, non-tender; bowel sounds normal; no masses,  no organomegaly        Assessment:    There are no diagnoses linked to this encounter.  Normal postpartum exam. PP Edinburgh exam 8. Patient declined IBH today.   Plan:   Essential components of care per ACOG recommendations:  1.  Mood and well being: Patient with positive depression screening today. Reviewed local resources for support. Declined IBH therapy  - Patient tobacco use? No.   - hx of drug use? No.    2. Infant care and feeding:  -Patient currently breastmilk feeding? No.  -Social determinants of health (SDOH) reviewed in EPIC.   3. Sexuality, contraception and birth spacing - Patient does not want a pregnancy in the next year.  Desired family size is 2 children.  -  Reviewed reproductive life planning. Reviewed contraceptive methods based on pt preferences and effectiveness.  Patient desired No Method - Other Reason today.  Reviewed Phexxi vs IUDs both hormonal and Non-hormonal options. Patient desires to think on it and return with answer.  - Discussed birth spacing of 18 months  4. Sleep and fatigue -Encouraged family/partner/community support of 4 hrs of uninterrupted sleep to help with mood and fatigue  5. Physical Recovery  - Discussed patients delivery and complications. She describes her labor as good. - Patient had a Vaginal, no problems at delivery. Patient had a 1st degree laceration. Perineal healing reviewed. Patient expressed understanding - Patient has urinary incontinence?  No. - Patient is safe to resume physical and sexual activity  6.  Health Maintenance - HM due items addressed Yes - Last pap smear  Diagnosis  Date Value Ref Range Status  09/06/2022 (A)  Final   - Atypical squamous cells of undetermined significance (ASC-US)   Pap smear not done at today's visit. Repeat in 1 -3 years  -Breast Cancer screening indicated? No.   7. Chronic Disease/Pregnancy Condition follow up: Gestational Diabetes. Patient was unsuccessful in holding the drink down and will return next week for repeat testing.   - PCP follow up  Earle Troiano Danella Deis) Suzie Portela, MSN, CNM  [facility]  05/20/2023 7:58 PM

## 2023-05-21 LAB — HEPATITIS B SURFACE ANTIGEN: Hepatitis B Surface Ag: NEGATIVE

## 2023-05-21 LAB — HIV ANTIBODY (ROUTINE TESTING W REFLEX): HIV Screen 4th Generation wRfx: NONREACTIVE

## 2023-05-21 LAB — HEPATITIS C ANTIBODY: Hep C Virus Ab: NONREACTIVE

## 2023-05-21 LAB — RPR: RPR Ser Ql: NONREACTIVE

## 2023-05-23 LAB — CERVICOVAGINAL ANCILLARY ONLY
Bacterial Vaginitis (gardnerella): POSITIVE — AB
Candida Glabrata: NEGATIVE
Candida Vaginitis: NEGATIVE
Chlamydia: NEGATIVE
Comment: NEGATIVE
Comment: NEGATIVE
Comment: NEGATIVE
Comment: NEGATIVE
Comment: NEGATIVE
Comment: NORMAL
Neisseria Gonorrhea: NEGATIVE
Trichomonas: NEGATIVE

## 2023-05-24 ENCOUNTER — Encounter: Payer: Self-pay | Admitting: Certified Nurse Midwife

## 2023-05-24 MED ORDER — METRONIDAZOLE 500 MG PO TABS: 500.0000 mg | ORAL_TABLET | Freq: Two times a day (BID) | ORAL | 0 refills | Status: AC

## 2023-05-24 NOTE — Addendum Note (Signed)
Addended by: Carlynn Herald on: 05/24/2023 11:13 AM   Modules accepted: Orders

## 2023-05-27 ENCOUNTER — Other Ambulatory Visit: Payer: BLUE CROSS/BLUE SHIELD

## 2024-03-13 ENCOUNTER — Ambulatory Visit: Payer: Self-pay | Admitting: Physician Assistant
